# Patient Record
Sex: Female | Born: 1980 | Race: White | Hispanic: No | Marital: Single | Smoking: Current every day smoker
Health system: Southern US, Community
[De-identification: ages and names within clinical notes are randomized; demographics above are authoritative.]

## PROBLEM LIST (undated history)

## (undated) DIAGNOSIS — F1111 Opioid abuse, in remission: Secondary | ICD-10-CM

## (undated) DIAGNOSIS — G43909 Migraine, unspecified, not intractable, without status migrainosus: Secondary | ICD-10-CM

## (undated) DIAGNOSIS — I341 Nonrheumatic mitral (valve) prolapse: Secondary | ICD-10-CM

## (undated) DIAGNOSIS — F32A Depression, unspecified: Secondary | ICD-10-CM

## (undated) DIAGNOSIS — F419 Anxiety disorder, unspecified: Secondary | ICD-10-CM

## (undated) DIAGNOSIS — F1910 Other psychoactive substance abuse, uncomplicated: Secondary | ICD-10-CM

## (undated) DIAGNOSIS — E039 Hypothyroidism, unspecified: Secondary | ICD-10-CM

## (undated) DIAGNOSIS — F329 Major depressive disorder, single episode, unspecified: Secondary | ICD-10-CM

## (undated) HISTORY — PX: MOUTH SURGERY: SHX715

## (undated) HISTORY — PX: KNEE ARTHROPLASTY: SHX992

## (undated) HISTORY — PX: KNEE SURGERY: SHX244

---

## 1998-03-19 ENCOUNTER — Ambulatory Visit (HOSPITAL_COMMUNITY): Admission: RE | Admit: 1998-03-19 | Discharge: 1998-03-19 | Payer: Self-pay | Admitting: Family Medicine

## 2005-06-14 ENCOUNTER — Emergency Department (HOSPITAL_COMMUNITY): Admission: EM | Admit: 2005-06-14 | Discharge: 2005-06-14 | Payer: Self-pay | Admitting: Family Medicine

## 2005-07-06 ENCOUNTER — Emergency Department (HOSPITAL_COMMUNITY): Admission: EM | Admit: 2005-07-06 | Discharge: 2005-07-06 | Payer: Self-pay | Admitting: Family Medicine

## 2005-07-20 ENCOUNTER — Emergency Department (HOSPITAL_COMMUNITY): Admission: EM | Admit: 2005-07-20 | Discharge: 2005-07-20 | Payer: Self-pay | Admitting: Family Medicine

## 2005-08-26 ENCOUNTER — Emergency Department (HOSPITAL_COMMUNITY): Admission: EM | Admit: 2005-08-26 | Discharge: 2005-08-26 | Payer: Self-pay | Admitting: Family Medicine

## 2005-09-13 ENCOUNTER — Emergency Department (HOSPITAL_COMMUNITY): Admission: EM | Admit: 2005-09-13 | Discharge: 2005-09-13 | Payer: Self-pay | Admitting: Family Medicine

## 2005-10-04 ENCOUNTER — Emergency Department (HOSPITAL_COMMUNITY): Admission: EM | Admit: 2005-10-04 | Discharge: 2005-10-04 | Payer: Self-pay | Admitting: Family Medicine

## 2005-11-19 ENCOUNTER — Emergency Department (HOSPITAL_COMMUNITY): Admission: EM | Admit: 2005-11-19 | Discharge: 2005-11-19 | Payer: Self-pay | Admitting: Family Medicine

## 2005-11-27 ENCOUNTER — Emergency Department (HOSPITAL_COMMUNITY): Admission: EM | Admit: 2005-11-27 | Discharge: 2005-11-27 | Payer: Self-pay | Admitting: Family Medicine

## 2005-12-13 ENCOUNTER — Emergency Department (HOSPITAL_COMMUNITY): Admission: EM | Admit: 2005-12-13 | Discharge: 2005-12-13 | Payer: Self-pay | Admitting: Family Medicine

## 2005-12-17 ENCOUNTER — Emergency Department (HOSPITAL_COMMUNITY): Admission: EM | Admit: 2005-12-17 | Discharge: 2005-12-17 | Payer: Self-pay | Admitting: Family Medicine

## 2006-01-12 ENCOUNTER — Ambulatory Visit: Payer: Self-pay | Admitting: Family Medicine

## 2006-01-21 ENCOUNTER — Emergency Department (HOSPITAL_COMMUNITY): Admission: EM | Admit: 2006-01-21 | Discharge: 2006-01-21 | Payer: Self-pay | Admitting: Family Medicine

## 2006-02-07 ENCOUNTER — Ambulatory Visit: Payer: Self-pay | Admitting: Family Medicine

## 2006-02-20 ENCOUNTER — Ambulatory Visit: Payer: Self-pay | Admitting: Family Medicine

## 2006-02-26 ENCOUNTER — Emergency Department (HOSPITAL_COMMUNITY): Admission: EM | Admit: 2006-02-26 | Discharge: 2006-02-26 | Payer: Self-pay | Admitting: Family Medicine

## 2006-03-08 ENCOUNTER — Emergency Department (HOSPITAL_COMMUNITY): Admission: EM | Admit: 2006-03-08 | Discharge: 2006-03-08 | Payer: Self-pay | Admitting: Family Medicine

## 2006-03-21 ENCOUNTER — Ambulatory Visit: Payer: Self-pay | Admitting: Family Medicine

## 2006-03-25 ENCOUNTER — Emergency Department (HOSPITAL_COMMUNITY): Admission: AD | Admit: 2006-03-25 | Discharge: 2006-03-25 | Payer: Self-pay | Admitting: Family Medicine

## 2006-04-11 ENCOUNTER — Emergency Department (HOSPITAL_COMMUNITY): Admission: EM | Admit: 2006-04-11 | Discharge: 2006-04-11 | Payer: Self-pay | Admitting: Emergency Medicine

## 2006-04-12 ENCOUNTER — Ambulatory Visit: Payer: Self-pay | Admitting: Family Medicine

## 2006-04-24 ENCOUNTER — Emergency Department (HOSPITAL_COMMUNITY): Admission: EM | Admit: 2006-04-24 | Discharge: 2006-04-24 | Payer: Self-pay | Admitting: Family Medicine

## 2006-05-09 ENCOUNTER — Ambulatory Visit: Payer: Self-pay | Admitting: Family Medicine

## 2010-05-16 ENCOUNTER — Emergency Department (HOSPITAL_COMMUNITY): Admission: EM | Admit: 2010-05-16 | Discharge: 2010-05-16 | Payer: Self-pay | Admitting: Emergency Medicine

## 2010-10-06 LAB — URINE CULTURE: Colony Count: >=100000

## 2010-10-06 LAB — ETHANOL: Alcohol, Ethyl (B): <5 mg/dL (ref 0–10)

## 2010-10-06 LAB — RAPID URINE DRUG SCREEN, HOSP PERFORMED
Amphetamines: POSITIVE — AB
Cocaine: NOT DETECTED
Tetrahydrocannabinol: POSITIVE — AB

## 2010-10-06 LAB — COMPREHENSIVE METABOLIC PANEL
ALT: 9 U/L (ref 0–35)
AST: 16 U/L (ref 0–37)
Alkaline Phosphatase: 58 U/L (ref 39–117)
CO2: 24 mEq/L (ref 19–32)
Calcium: 9 mg/dL (ref 8.4–10.5)
Chloride: 110 mEq/L (ref 96–112)
GFR calc Af Amer: >60 mL/min (ref >60)
GFR calc non Af Amer: >60 mL/min (ref >60)
Glucose, Bld: 98 mg/dL (ref 70–99)
Potassium: 3.9 mEq/L (ref 3.5–5.1)
Sodium: 138 mEq/L (ref 135–145)
Total Bilirubin: 0.8 mg/dL (ref 0.3–1.2)

## 2010-10-06 LAB — CBC
HCT: 37.6 % (ref 36.0–46.0)
Hemoglobin: 12.7 g/dL (ref 12.0–15.0)
MCHC: 33.7 g/dL (ref 30.0–36.0)
RBC: 4.1 MIL/uL (ref 3.87–5.11)

## 2010-10-06 LAB — URINALYSIS, ROUTINE W REFLEX MICROSCOPIC
Glucose, UA: NEGATIVE mg/dL
Nitrite: POSITIVE — AB
Protein, ur: NEGATIVE mg/dL
pH: 7 (ref 5.0–8.0)

## 2010-10-06 LAB — URINE MICROSCOPIC-ADD ON

## 2010-10-06 LAB — DIFFERENTIAL
Basophils Absolute: 0 10*3/uL (ref 0.0–0.1)
Basophils Relative: 1 % (ref 0–1)
Eosinophils Absolute: 0.1 10*3/uL (ref 0.0–0.7)
Eosinophils Relative: 1 % (ref 0–5)
Neutrophils Relative %: 72 % (ref 43–77)

## 2010-10-06 LAB — POCT PREGNANCY, URINE: Preg Test, Ur: NEGATIVE

## 2011-06-21 ENCOUNTER — Emergency Department (HOSPITAL_COMMUNITY)
Admission: EM | Admit: 2011-06-21 | Discharge: 2011-06-22 | Disposition: A | Discharge status: Home / Self Care | Payer: Self-pay | Attending: Emergency Medicine | Admitting: Emergency Medicine

## 2011-06-21 DIAGNOSIS — F1123 Opioid dependence with withdrawal: Secondary | ICD-10-CM

## 2011-06-21 DIAGNOSIS — F1120 Opioid dependence, uncomplicated: Secondary | ICD-10-CM | POA: Insufficient documentation

## 2011-06-21 DIAGNOSIS — F1910 Other psychoactive substance abuse, uncomplicated: Secondary | ICD-10-CM

## 2011-06-21 DIAGNOSIS — F1110 Opioid abuse, uncomplicated: Secondary | ICD-10-CM | POA: Insufficient documentation

## 2011-06-21 DIAGNOSIS — F19939 Other psychoactive substance use, unspecified with withdrawal, unspecified: Secondary | ICD-10-CM | POA: Insufficient documentation

## 2011-06-21 DIAGNOSIS — R109 Unspecified abdominal pain: Secondary | ICD-10-CM | POA: Insufficient documentation

## 2011-06-21 DIAGNOSIS — Z79899 Other long term (current) drug therapy: Secondary | ICD-10-CM | POA: Insufficient documentation

## 2011-06-21 LAB — CBC
HCT: 34.7 % — ABNORMAL LOW (ref 36.0–46.0)
MCHC: 33.4 g/dL (ref 30.0–36.0)
Platelets: 255 10*3/uL (ref 150–400)
RDW: 14.1 % (ref 11.5–15.5)
WBC: 6.4 10*3/uL (ref 4.0–10.5)

## 2011-06-21 LAB — BASIC METABOLIC PANEL
BUN: 9 mg/dL (ref 6–23)
Chloride: 102 mEq/L (ref 96–112)
GFR calc Af Amer: >90 mL/min (ref >90)
GFR calc non Af Amer: >90 mL/min (ref >90)
Glucose, Bld: 87 mg/dL (ref 70–99)
Potassium: 3.9 mEq/L (ref 3.5–5.1)
Sodium: 138 mEq/L (ref 135–145)

## 2011-06-21 LAB — RAPID URINE DRUG SCREEN, HOSP PERFORMED
Amphetamines: NOT DETECTED
Opiates: POSITIVE — AB
Tetrahydrocannabinol: POSITIVE — AB

## 2011-06-21 LAB — ACETAMINOPHEN LEVEL: Acetaminophen (Tylenol), Serum: <15 ug/mL (ref 10–30)

## 2011-06-21 MED ORDER — ONDANSETRON HCL 8 MG PO TABS: 4.0000 mg | ORAL_TABLET | Freq: Three times a day (TID) | ORAL | Status: DC | PRN

## 2011-06-21 MED ORDER — CLONIDINE HCL 0.1 MG PO TABS: 0.2000 mg | ORAL_TABLET | Freq: Three times a day (TID) | ORAL | Status: DC | PRN

## 2011-06-21 MED ORDER — ZOLPIDEM TARTRATE 5 MG PO TABS: 5.0000 mg | ORAL_TABLET | Freq: Every evening | ORAL | Status: DC | PRN

## 2011-06-21 MED ORDER — LEVOTHYROXINE SODIUM 25 MCG PO TABS
25.0000 ug | ORAL_TABLET | ORAL | Status: DC
  Filled 2011-06-21: qty 1

## 2011-06-21 MED ORDER — LORAZEPAM 1 MG PO TABS
1.0000 mg | ORAL_TABLET | Freq: Three times a day (TID) | ORAL | Status: DC | PRN
  Administered 2011-06-21 – 2011-06-22 (×3): 1 mg via ORAL
  Filled 2011-06-21 (×3): qty 1

## 2011-06-21 MED ORDER — ACETAMINOPHEN 325 MG PO TABS: 650.0000 mg | ORAL_TABLET | ORAL | Status: DC | PRN

## 2011-06-21 MED ORDER — ALUM & MAG HYDROXIDE-SIMETH 200-200-20 MG/5ML PO SUSP: 30.0000 mL | ORAL | Status: DC | PRN

## 2011-06-21 MED ORDER — NICOTINE 21 MG/24HR TD PT24
21.0000 mg | MEDICATED_PATCH | Freq: Every day | TRANSDERMAL | Status: DC
  Administered 2011-06-21 – 2011-06-22 (×2): 21 mg via TRANSDERMAL
  Filled 2011-06-21 (×2): qty 1

## 2011-06-21 MED ORDER — IBUPROFEN 200 MG PO TABS
600.0000 mg | ORAL_TABLET | Freq: Three times a day (TID) | ORAL | Status: DC | PRN
  Administered 2011-06-22: 600 mg via ORAL
  Filled 2011-06-21: qty 3

## 2011-06-21 MED ORDER — CITALOPRAM HYDROBROMIDE 40 MG PO TABS
40.0000 mg | ORAL_TABLET | ORAL | Status: DC
  Filled 2011-06-21: qty 1

## 2011-06-21 NOTE — ED Notes (Signed)
Meal tray provided per pt request.  

## 2011-06-21 NOTE — ED Notes (Signed)
Resting quietly in bed. Pt remains calm/cooperative. Resp even and unlabored. No complaints at this time. Awaiting placement.

## 2011-06-21 NOTE — ED Notes (Signed)
Pt presents with request for detox from opiates.  Pt reports she went to [facility] and was told to come here for detox.  Pt reports last week, she attempted suicide by cutting her wrists, but when asked if she really wanted to die, pt states "no, I think it was a cry for help".  Pt reports she had pills hidden at home and took a vicodin 5mg  tablet this morning.  Pt reports previous detox with seizure activity.  Pt is alert, oriented and pleasant; mother with pt.
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
## 2011-06-21 NOTE — ED Notes (Signed)
Pt. Has requested help off of opiates .  Has been doing them for 10 years,.  Heroine

## 2011-06-21 NOTE — ED Notes (Signed)
ACT team at bedside. Report given to [facility]. Pt aware or move.

## 2011-06-21 NOTE — ED Provider Notes (Signed)
History     CSN: [id number] Arrival date & time: 06/21/2011  9:49 AM   First MD Initiated Contact with Patient 06/21/11 1207      Chief Complaint  Patient presents with  . Addiction Problem    opiates    (Consider location/radiation/quality/duration/timing/severity/associated sxs/prior treatment) HPI Patient is a 30 year old female with a history of opiate abuse for the past 10 years. She presents today with referral from [facility]. She's been told that she needs inpatient rehabilitation prior to being able to be expected to their rehabilitation program. The patient had been taking clonidine as an outpatient. She endorses heroin and opiate abuse. She denied other substances. Patient is not currently suicidal or homicidal. She has had hospitalization in the recent past for suicidal ideation. Patient does not drink any alcohol but does smoke tobacco. There are no other associated or modifying factors. She denies any pain and only mild abdominal discomfort at this time which she associates with not using substances. History reviewed. No pertinent past medical history.  Past Surgical History  Procedure Date  . Knee arthroplasty     History reviewed. No pertinent family history.  History  Substance Use Topics  . Smoking status: Current Everyday Smoker -- 1.0 packs/day  . Smokeless tobacco: Not on file  . Alcohol Use: No    OB History    Grav Para Term Preterm Abortions TAB SAB Ect Mult Living                  Review of Systems  Constitutional: Negative.   HENT: Negative.   Eyes: Negative.   Respiratory: Negative.   Cardiovascular: Negative.   Gastrointestinal: Negative.   Genitourinary: Negative.   Musculoskeletal: Negative.   Skin: Negative.   Neurological: Negative.   Hematological: Negative.   Psychiatric/Behavioral: Negative.   All other systems reviewed and are negative.    Allergies  Review of patient's allergies indicates no known allergies.  Home  Medications   Current Outpatient Rx  Name Route Sig Dispense Refill  . CITALOPRAM HYDROBROMIDE 40 MG PO TABS Oral Take 40 mg by mouth daily.      Marland Kitchen CLONIDINE HCL 0.2 MG PO TABS Oral Take 0.2 mg by mouth every 8 (eight) hours as needed. For withdrawal     . LEVOTHYROXINE SODIUM 25 MCG PO TABS Oral Take 25 mcg by mouth daily.        BP 96/72  Pulse 62  Temp(Src) 97.8 F (36.6 C) (Oral)  Resp 20  Ht 5\' 3"  (1.6 m)  Wt 100 lb (45.36 kg)  BMI 17.71 kg/m2  SpO2 98%  Physical Exam  Nursing note and vitals reviewed. Constitutional: She is oriented to person, place, and time. She appears well-developed and well-nourished. No distress.  HENT:  Head: Normocephalic and atraumatic.  Eyes: Conjunctivae and EOM are normal. Pupils are equal, round, and reactive to light.  Neck: Normal range of motion.  Cardiovascular: Normal rate, regular rhythm, normal heart sounds and intact distal pulses.  Exam reveals no gallop and no friction rub.   No murmur heard. Pulmonary/Chest: Effort normal and breath sounds normal. No respiratory distress. She has no wheezes. She has no rales.  Abdominal: Soft. Bowel sounds are normal. She exhibits no distension. There is no tenderness. There is no rebound and no guarding.  Musculoskeletal: Normal range of motion. She exhibits no edema and no tenderness.  Neurological: She is alert and oriented to person, place, and time. No cranial nerve deficit. She exhibits normal muscle  tone. Coordination normal.  Skin: Skin is warm and dry. No rash noted.  Psychiatric: She has a normal mood and affect.    ED Course  Procedures (including critical care time)  Labs Reviewed  CBC - Abnormal; Notable for the following:    RBC 3.76 (*)    Hemoglobin 11.6 (*)    HCT 34.7 (*)    All other components within normal limits  URINE RAPID DRUG SCREEN (HOSP PERFORMED) - Abnormal; Notable for the following:    Opiates POSITIVE (*)    Tetrahydrocannabinol POSITIVE (*)    Barbiturates  POSITIVE (*)    All other components within normal limits  ETHANOL  ACETAMINOPHEN LEVEL  BASIC METABOLIC PANEL  POCT PREGNANCY, URINE  POCT PREGNANCY, URINE   No results found.   1. Polysubstance abuse       MDM  Patient was admitted and interviewed by myself. Patient did not have any suicidal or homicidal ideations. She is here purely because she wants detox. Medical clearance labs were ordered. Patient was seen by the act team. Holding orders were placed including orders for clonidine as well as the patient's home medications of Celexa and Synthroid. [facility] is being contacted per the act team regarding placement.        Cyndra Numbers, MD 06/21/11 [phone]

## 2011-06-21 NOTE — ED Notes (Signed)
ACT team at bedside to see pt. Moving to room 31 after consult.

## 2011-06-22 MED ORDER — LORAZEPAM 1 MG PO TABS: 1.0000 mg | ORAL_TABLET | Freq: Three times a day (TID) | ORAL | Status: AC | PRN

## 2011-06-22 MED ORDER — ONDANSETRON 4 MG PO TBDP
ORAL_TABLET | ORAL | Status: AC
  Administered 2011-06-22: 4 mg via ORAL
  Filled 2011-06-22: qty 1

## 2011-06-22 MED ORDER — ONDANSETRON HCL 4 MG PO TABS: 4.0000 mg | ORAL_TABLET | Freq: Four times a day (QID) | ORAL | Status: AC

## 2011-06-22 MED ORDER — PROMETHAZINE HCL 25 MG PO TABS: 25.0000 mg | ORAL_TABLET | Freq: Three times a day (TID) | ORAL | Status: DC | PRN

## 2011-06-22 NOTE — ED Notes (Signed)
Pt awnakened for exam by mental health. No s/o distress.

## 2011-06-22 NOTE — ED Notes (Signed)
Mother of Terri Oneal, asking to speak with [name] from [facility]. Called [name] to request that she come and talk with her.
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
## 2011-06-22 NOTE — ED Provider Notes (Signed)
Pt/family agreeable for d/c and f/u with [facility] Ativan/phenergen/zofran ordered Already has clonidine No SI Well appearing, stable for d/c  Joya Gaskins, MD 06/22/11 1652

## 2011-06-22 NOTE — ED Notes (Signed)
Terri Oneal here to talk with the mother of Terri Oneal.

## 2011-06-22 NOTE — ED Notes (Signed)
Medications from home counted and given to pharm tech to take downstairs to pharmacy. Family member at bedside upset that pt not admitted yet. Explained the process to him and the pt.

## 2012-03-05 ENCOUNTER — Emergency Department (HOSPITAL_COMMUNITY)
Admission: EM | Admit: 2012-03-05 | Discharge: 2012-03-05 | Disposition: A | Discharge status: Home / Self Care | Payer: Self-pay | Attending: Emergency Medicine | Admitting: Emergency Medicine

## 2012-03-05 ENCOUNTER — Encounter (HOSPITAL_COMMUNITY): Payer: Self-pay | Admitting: Emergency Medicine

## 2012-03-05 DIAGNOSIS — O2390 Unspecified genitourinary tract infection in pregnancy, unspecified trimester: Secondary | ICD-10-CM | POA: Insufficient documentation

## 2012-03-05 DIAGNOSIS — N390 Urinary tract infection, site not specified: Secondary | ICD-10-CM

## 2012-03-05 DIAGNOSIS — M549 Dorsalgia, unspecified: Secondary | ICD-10-CM | POA: Insufficient documentation

## 2012-03-05 DIAGNOSIS — Z331 Pregnant state, incidental: Secondary | ICD-10-CM

## 2012-03-05 DIAGNOSIS — E039 Hypothyroidism, unspecified: Secondary | ICD-10-CM | POA: Insufficient documentation

## 2012-03-05 DIAGNOSIS — R0602 Shortness of breath: Secondary | ICD-10-CM | POA: Insufficient documentation

## 2012-03-05 DIAGNOSIS — F341 Dysthymic disorder: Secondary | ICD-10-CM | POA: Insufficient documentation

## 2012-03-05 HISTORY — DX: Anxiety disorder, unspecified: F41.9

## 2012-03-05 HISTORY — DX: Hypothyroidism, unspecified: E03.9

## 2012-03-05 HISTORY — DX: Migraine, unspecified, not intractable, without status migrainosus: G43.909

## 2012-03-05 HISTORY — DX: Major depressive disorder, single episode, unspecified: F32.9

## 2012-03-05 HISTORY — DX: Opioid abuse, in remission: F11.11

## 2012-03-05 HISTORY — DX: Nonrheumatic mitral (valve) prolapse: I34.1

## 2012-03-05 HISTORY — DX: Depression, unspecified: F32.A

## 2012-03-05 LAB — URINALYSIS, ROUTINE W REFLEX MICROSCOPIC
Bilirubin Urine: NEGATIVE
Glucose, UA: NEGATIVE mg/dL
Ketones, ur: NEGATIVE mg/dL
Specific Gravity, Urine: 1.003 — ABNORMAL LOW (ref 1.005–1.030)
pH: 7 (ref 5.0–8.0)

## 2012-03-05 LAB — URINE MICROSCOPIC-ADD ON

## 2012-03-05 MED ORDER — ONDANSETRON HCL 4 MG PO TABS: 4.0000 mg | ORAL_TABLET | Freq: Three times a day (TID) | ORAL | Status: AC | PRN

## 2012-03-05 MED ORDER — PRENATAL COMPLETE 14-0.4 MG PO TABS: 1.0000 | ORAL_TABLET | Freq: Every day | ORAL | Status: DC

## 2012-03-05 MED ORDER — CEPHALEXIN 500 MG PO CAPS: 500.0000 mg | ORAL_CAPSULE | Freq: Four times a day (QID) | ORAL | Status: AC

## 2012-03-05 MED ORDER — ACETAMINOPHEN 500 MG PO TABS
1000.0000 mg | ORAL_TABLET | Freq: Once | ORAL | Status: AC
  Administered 2012-03-05: 1000 mg via ORAL
  Filled 2012-03-05: qty 2

## 2012-03-05 NOTE — ED Notes (Signed)
Pt states for the past 4 to 5 days she has been feeling sick  Pt states for the past 3 days she has had a fever  4 days ago she started having pain in the lower part of her back on the right side  Pt states she is scared to take a deep breath because it causes her to have pain in her lower back on the right   Pt states she feels extremely tired and just today has started to have nausea

## 2012-03-05 NOTE — ED Provider Notes (Signed)
History     CSN: [id number]  Arrival date & time 03/05/12  2037   First MD Initiated Contact with Patient 03/05/12 2113      Chief Complaint  Patient presents with  . Fever  . Shortness of Breath  . Back Pain     HPI Pt was seen at 2130.  Per pt, c/o gradual onset and persistence of constant dysuria for the past 4-5 days.  Has been associated with right flank pain, nausea, and home fever to "102" for past 1-2 days.  Denies abd pain, no rash, no CP/SOB, no vaginal bleeding/discharge, no hematuria.    Past Medical History  Diagnosis Date  . Hypothyroid   . Migraine   . MVP (mitral valve prolapse)   . Opioid abuse, in remission   . Anxiety and depression     Past Surgical History  Procedure Date  . Knee arthroplasty   . Knee surgery   . Mouth surgery     Family History  Problem Relation Age of Onset  . Diabetes Other   . Hypertension Other   . Thyroid disease Other     History  Substance Use Topics  . Smoking status: Current Everyday Smoker -- 1.0 packs/day    Types: Cigarettes  . Smokeless tobacco: Not on file  . Alcohol Use: No    Review of Systems ROS: Statement: All systems negative except as marked or noted in the HPI; Constitutional: +fever and chills. ; ; Eyes: Negative for eye pain, redness and discharge. ; ; ENMT: Negative for ear pain, hoarseness, nasal congestion, sinus pressure and sore throat. ; ; Cardiovascular: Negative for chest pain, palpitations, diaphoresis, dyspnea and peripheral edema. ; ; Respiratory: Negative for cough, wheezing and stridor. ; ; Gastrointestinal: +nausea. Negative for vomiting, diarrhea, abdominal pain, blood in stool, hematemesis, jaundice and rectal bleeding. . ; ; Genitourinary: +dysuria, flank pain. Negative for hematuria. ; ; GYN:  No vaginal bleeding, no vaginal discharge, no vulvar pain. ;; Musculoskeletal: Negative for neck pain. Negative for swelling and trauma.; ; Skin: Negative for pruritus, rash, abrasions, blisters,  bruising and skin lesion.; ; Neuro: Negative for headache, lightheadedness and neck stiffness. Negative for weakness, altered level of consciousness , altered mental status, extremity weakness, paresthesias, involuntary movement, seizure and syncope.      Allergies  Review of patient's allergies indicates no known allergies.  Home Medications   Current Outpatient Rx  Name Route Sig Dispense Refill  . BUPROPION HCL ER (XL) 300 MG PO TB24 Oral Take 300 mg by mouth daily.    Marland Kitchen CITALOPRAM HYDROBROMIDE 20 MG PO TABS Oral Take 20 mg by mouth daily.    . IBUPROFEN 200 MG PO TABS Oral Take 800 mg by mouth every 6 (six) hours as needed.      BP 110/77  Pulse 89  Temp 99.9 F (37.7 C) (Oral)  Resp 20  SpO2 100%  LMP 02/12/2012  Physical Exam 2135: Physical examination:  Nursing notes reviewed; Vital signs and O2 SAT reviewed;  Constitutional: Well developed, Well nourished, Well hydrated, In no acute distress; Head:  Normocephalic, atraumatic; Eyes: EOMI, PERRL, No scleral icterus; ENMT: Mouth and pharynx normal, Mucous membranes moist; Neck: Supple, Full range of motion, No lymphadenopathy; Cardiovascular: Regular rate and rhythm, No murmur, rub, or gallop; Respiratory: Breath sounds clear & equal bilaterally, No rales, rhonchi, wheezes.  Speaking full sentences with ease, Normal respiratory effort/excursion; Chest: Nontender, Movement normal; Abdomen: Soft, Nontender, Nondistended, Normal bowel sounds; Genitourinary: No CVA tenderness;  Spine:  No midline CS, TS, LS tenderness.  +TTP right lumbar paraspinal muscles.; Extremities: Pulses normal, No tenderness, No edema, No calf edema or asymmetry.; Neuro: AA&Ox3, Major CN grossly intact.  Speech clear. No gross focal motor or sensory deficits in extremities.; Skin: Color normal, Warm, Dry.   ED Course  Procedures    MDM  MDM Reviewed: nursing note and vitals Interpretation: labs     Results for orders placed during the hospital  encounter of 03/05/12  URINALYSIS, ROUTINE W REFLEX MICROSCOPIC      Component Value Range   Color, Urine YELLOW  YELLOW   APPearance CLOUDY (*) CLEAR   Specific Gravity, Urine 1.003 (*) 1.005 - 1.030   pH 7.0  5.0 - 8.0   Glucose, UA NEGATIVE  NEGATIVE mg/dL   Hgb urine dipstick SMALL (*) NEGATIVE   Bilirubin Urine NEGATIVE  NEGATIVE   Ketones, ur NEGATIVE  NEGATIVE mg/dL   Protein, ur NEGATIVE  NEGATIVE mg/dL   Urobilinogen, UA 1.0  0.0 - 1.0 mg/dL   Nitrite NEGATIVE  NEGATIVE   Leukocytes, UA LARGE (*) NEGATIVE  PREGNANCY, URINE      Component Value Range   Preg Test, Ur POSITIVE (*) NEGATIVE  URINE MICROSCOPIC-ADD ON      Component Value Range   Squamous Epithelial / LPF FEW (*) RARE   WBC, UA 21-50  <3 WBC/hpf   RBC / HPF 3-6  <3 RBC/hpf   Bacteria, UA FEW (*) RARE      2230:  Pt states she is ready to go home now.  Has tol PO well without N/V.  Endorses she has been having regular menses and "didn't know I was pregnant."  States she does not have any insurance and does not want any further testing.  Abd is benign on exam.  Pt encouraged to f/u with OB/GYN.  Dx testing d/w pt.  Questions answered.  Verb understanding, agreeable to d/c home with outpt f/u.          Laray Anger, DO 03/07/12 1621

## 2013-11-26 ENCOUNTER — Emergency Department (HOSPITAL_COMMUNITY)
Admission: EM | Admit: 2013-11-26 | Discharge: 2013-11-26 | Disposition: A | Discharge status: Home / Self Care | Payer: Self-pay | Attending: Emergency Medicine | Admitting: Emergency Medicine

## 2013-11-26 ENCOUNTER — Encounter (HOSPITAL_COMMUNITY): Payer: Self-pay | Admitting: Emergency Medicine

## 2013-11-26 DIAGNOSIS — Z8639 Personal history of other endocrine, nutritional and metabolic disease: Secondary | ICD-10-CM | POA: Insufficient documentation

## 2013-11-26 DIAGNOSIS — Z862 Personal history of diseases of the blood and blood-forming organs and certain disorders involving the immune mechanism: Secondary | ICD-10-CM | POA: Insufficient documentation

## 2013-11-26 DIAGNOSIS — G43909 Migraine, unspecified, not intractable, without status migrainosus: Secondary | ICD-10-CM | POA: Insufficient documentation

## 2013-11-26 DIAGNOSIS — F17200 Nicotine dependence, unspecified, uncomplicated: Secondary | ICD-10-CM | POA: Insufficient documentation

## 2013-11-26 DIAGNOSIS — Z8659 Personal history of other mental and behavioral disorders: Secondary | ICD-10-CM | POA: Insufficient documentation

## 2013-11-26 MED ORDER — SODIUM CHLORIDE 0.9 % IV BOLUS (SEPSIS)
1000.0000 mL | Freq: Once | INTRAVENOUS | Status: AC
  Administered 2013-11-26: 1000 mL via INTRAVENOUS

## 2013-11-26 MED ORDER — PROCHLORPERAZINE EDISYLATE 5 MG/ML IJ SOLN
10.0000 mg | Freq: Once | INTRAMUSCULAR | Status: AC
  Administered 2013-11-26: 10 mg via INTRAVENOUS
  Filled 2013-11-26: qty 2

## 2013-11-26 NOTE — ED Notes (Addendum)
Pt reports hx of migraines. Pt unable to take narcotics due to past abuse. Pt reports her migraines normally do not last this long, normally only 4-6 hours. Reports she normally sees spots in one eye and has pain on the other side of head. Now has seen spots of both sides, so pain across entire head. Pain at present 5/10, has been taking advil. Vomited yesterday. sensitivity to light and sound. Needs doctors note.

## 2013-11-26 NOTE — ED Provider Notes (Signed)
TIME SEEN: 1:00 PM  CHIEF COMPLAINT: Migraine headache  HPI: Patient is a 33 y.o. F with history of migraine headaches, hypothyroidism, opioid abuse who presents emergency room and with migraine headache has lasted for several days. She states the pain is diffuse across her head, throbbing associated with photophobia and nausea. She states this feels like her prior migraines but normally they resolve with ibuprofen at home. She denies any fevers. No neck pain or neck stiffness. No head injury. She is not on anticoagulation. No numbness or focal weakness. No thunderclap headache. No sudden onset headache. No worst headache of her life.  ROS: See HPI Constitutional: no fever  Eyes: no drainage  ENT: no runny nose   Cardiovascular:  no chest pain  Resp: no SOB  GI: no vomiting GU: no dysuria Integumentary: no rash  Allergy: no hives  Musculoskeletal: no leg swelling  Neurological: no slurred speech ROS otherwise negative  PAST MEDICAL HISTORY/PAST SURGICAL HISTORY:  Past Medical History  Diagnosis Date  . Hypothyroid   . Migraine   . MVP (mitral valve prolapse)   . Opioid abuse, in remission   . Anxiety and depression     MEDICATIONS:  Prior to Admission medications   Medication Sig Start Date End Date Taking? Authorizing Provider  ibuprofen (ADVIL,MOTRIN) 200 MG tablet Take 800 mg by mouth every 6 (six) hours as needed.   Yes Historical Provider, MD    ALLERGIES:  No Known Allergies  SOCIAL HISTORY:  History  Substance Use Topics  . Smoking status: Current Every Day Smoker -- 1.00 packs/day    Types: Cigarettes  . Smokeless tobacco: Not on file  . Alcohol Use: No    FAMILY HISTORY: Family History  Problem Relation Age of Onset  . Diabetes Other   . Hypertension Other   . Thyroid disease Other     EXAM: BP 128/85  Pulse 75  Temp(Src) 98.6 F (37 C) (Oral)  Resp 16  SpO2 99% CONSTITUTIONAL: Alert and oriented and responds appropriately to questions.  Well-appearing; well-nourished HEAD: Normocephalic EYES: Conjunctivae clear, PERRL ENT: normal nose; no rhinorrhea; moist mucous membranes; pharynx without lesions noted NECK: Supple, no meningismus, no LAD  CARD: RRR; S1 and S2 appreciated; no murmurs, no clicks, no rubs, no gallops RESP: Normal chest excursion without splinting or tachypnea; breath sounds clear and equal bilaterally; no wheezes, no rhonchi, no rales,  ABD/GI: Normal bowel sounds; non-distended; soft, non-tender, no rebound, no guarding BACK:  The back appears normal and is non-tender to palpation, there is no CVA tenderness EXT: Normal ROM in all joints; non-tender to palpation; no edema; normal capillary refill; no cyanosis    SKIN: Normal color for age and race; warm NEURO: Moves all extremities equally, sensation to light touch intact diffusely, cranial nerves II through XII intact, normal gait PSYCH: The patient's mood and manner are appropriate. Grooming and personal hygiene are appropriate.  MEDICAL DECISION MAKING: Patient here with migraine headache. She is otherwise hemodynamically stable, well-appearing, nontoxic and neurologically intact. We'll give IV fluids and Compazine and reassess.  ED PROGRESS: Patient's headache is almost completely resolved with IV Compazine. She states she's feeling much better ready for discharge home. We'll discharge with work note for today and yesterday which seems to be her primary concern. Have discussed strict return precautions. Have discussed supportive care instructions. Patient verbalizes understanding and is comfortable with plan. She is pleased with care.     Layla MawKristen N Nijah Orlich, DO 11/26/13 1540

## 2013-11-26 NOTE — Discharge Instructions (Signed)
Migraine Headache A migraine headache is an intense, throbbing pain on one or both sides of your head. A migraine can last for 30 minutes to several hours. CAUSES  The exact cause of a migraine headache is not always known. However, a migraine may be caused when nerves in the brain become irritated and release chemicals that cause inflammation. This causes pain. Certain things may also trigger migraines, such as:  Alcohol.  Smoking.  Stress.  Menstruation.  Aged cheeses.  Foods or drinks that contain nitrates, glutamate, aspartame, or tyramine.  Lack of sleep.  Chocolate.  Caffeine.  Hunger.  Physical exertion.  Fatigue.  Medicines used to treat chest pain (nitroglycerine), birth control pills, estrogen, and some blood pressure medicines. SIGNS AND SYMPTOMS  Pain on one or both sides of your head.  Pulsating or throbbing pain.  Severe pain that prevents daily activities.  Pain that is aggravated by any physical activity.  Nausea, vomiting, or both.  Dizziness.  Pain with exposure to bright lights, loud noises, or activity.  General sensitivity to bright lights, loud noises, or smells. Before you get a migraine, you may get warning signs that a migraine is coming (aura). An aura may include:  Seeing flashing lights.  Seeing bright spots, halos, or zig-zag lines.  Having tunnel vision or blurred vision.  Having feelings of numbness or tingling.  Having trouble talking.  Having muscle weakness. DIAGNOSIS  A migraine headache is often diagnosed based on:  Symptoms.  Physical exam.  A CT scan or MRI of your head. These imaging tests cannot diagnose migraines, but they can help rule out other causes of headaches. TREATMENT Medicines may be given for pain and nausea. Medicines can also be given to help prevent recurrent migraines.  HOME CARE INSTRUCTIONS  Only take over-the-counter or prescription medicines for pain or discomfort as directed by your  health care provider. The use of long-term narcotics is not recommended.  Lie down in a dark, quiet room when you have a migraine.  Keep a journal to find out what may trigger your migraine headaches. For example, write down:  What you eat and drink.  How much sleep you get.  Any change to your diet or medicines.  Limit alcohol consumption.  Quit smoking if you smoke.  Get 7 9 hours of sleep, or as recommended by your health care provider.  Limit stress.  Keep lights dim if bright lights bother you and make your migraines worse. SEEK IMMEDIATE MEDICAL CARE IF:   Your migraine becomes severe.  You have a fever.  You have a stiff neck.  You have vision loss.  You have muscular weakness or loss of muscle control.  You start losing your balance or have trouble walking.  You feel faint or pass out.  You have severe symptoms that are different from your first symptoms. MAKE SURE YOU:   Understand these instructions.  Will watch your condition.  Will get help right away if you are not doing well or get worse. Document Released: 07/11/2005 Document Revised: 05/01/2013 Document Reviewed: 03/18/2013 [employer] Patient Information 2014 [employer].  

## 2013-12-02 ENCOUNTER — Encounter (HOSPITAL_COMMUNITY): Payer: Self-pay | Admitting: Emergency Medicine

## 2013-12-02 ENCOUNTER — Emergency Department (HOSPITAL_COMMUNITY)
Admission: EM | Admit: 2013-12-02 | Discharge: 2013-12-02 | Disposition: A | Discharge status: Home / Self Care | Payer: Self-pay | Attending: Emergency Medicine | Admitting: Emergency Medicine

## 2013-12-02 DIAGNOSIS — Z8679 Personal history of other diseases of the circulatory system: Secondary | ICD-10-CM | POA: Insufficient documentation

## 2013-12-02 DIAGNOSIS — Z862 Personal history of diseases of the blood and blood-forming organs and certain disorders involving the immune mechanism: Secondary | ICD-10-CM | POA: Insufficient documentation

## 2013-12-02 DIAGNOSIS — Z8639 Personal history of other endocrine, nutritional and metabolic disease: Secondary | ICD-10-CM | POA: Insufficient documentation

## 2013-12-02 DIAGNOSIS — Z87440 Personal history of urinary (tract) infections: Secondary | ICD-10-CM | POA: Insufficient documentation

## 2013-12-02 DIAGNOSIS — Z8659 Personal history of other mental and behavioral disorders: Secondary | ICD-10-CM | POA: Insufficient documentation

## 2013-12-02 DIAGNOSIS — N12 Tubulo-interstitial nephritis, not specified as acute or chronic: Secondary | ICD-10-CM | POA: Insufficient documentation

## 2013-12-02 DIAGNOSIS — F17200 Nicotine dependence, unspecified, uncomplicated: Secondary | ICD-10-CM | POA: Insufficient documentation

## 2013-12-02 LAB — URINE MICROSCOPIC-ADD ON

## 2013-12-02 LAB — URINALYSIS, ROUTINE W REFLEX MICROSCOPIC
Bilirubin Urine: NEGATIVE
Glucose, UA: NEGATIVE mg/dL
Ketones, ur: NEGATIVE mg/dL
Nitrite: POSITIVE — AB
Protein, ur: 100 mg/dL — AB
Specific Gravity, Urine: 1.013 (ref 1.005–1.030)
Urobilinogen, UA: 1 mg/dL (ref 0.0–1.0)
pH: 6 (ref 5.0–8.0)

## 2013-12-02 MED ORDER — CEFTRIAXONE SODIUM 1 G IJ SOLR
1.0000 g | Freq: Once | INTRAMUSCULAR | Status: AC
  Administered 2013-12-02: 1 g via INTRAMUSCULAR
  Filled 2013-12-02: qty 10

## 2013-12-02 MED ORDER — CEPHALEXIN 500 MG PO CAPS: 500.0000 mg | ORAL_CAPSULE | Freq: Four times a day (QID) | ORAL | Status: DC

## 2013-12-02 MED ORDER — PROMETHAZINE HCL 25 MG PO TABS: 25.0000 mg | ORAL_TABLET | Freq: Four times a day (QID) | ORAL | Status: DC | PRN

## 2013-12-02 MED ORDER — LIDOCAINE HCL 1 % IJ SOLN
INTRAMUSCULAR | Status: AC
  Filled 2013-12-02: qty 20

## 2013-12-02 NOTE — ED Provider Notes (Signed)
CSN: [id number]     Arrival date & time 12/02/13  0759 History   First MD Initiated Contact with Patient 12/02/13 0827     No chief complaint on file.    (Consider location/radiation/quality/duration/timing/severity/associated sxs/prior Treatment) HPI Pt is a 33yo female presenting with concerns of kidney infection. Pt reports having urinary symptoms including frequency and urgency x1 month, gradually worsened into right lower back pain, hot and cold chills with dysuria and foul smelling urine x4-5 days.  Reports drinking cranberry juice w/o relief.  Denies taking tylenol or motrin for pain. Nausea but no vomiting or diarrhea. No vaginal symptoms. Denies fever.    Past Medical History  Diagnosis Date  . Hypothyroid   . Migraine   . MVP (mitral valve prolapse)   . Opioid abuse, in remission   . Anxiety and depression    Past Surgical History  Procedure Laterality Date  . Knee arthroplasty    . Knee surgery    . Mouth surgery     Family History  Problem Relation Age of Onset  . Diabetes Other   . Hypertension Other   . Thyroid disease Other    History  Substance Use Topics  . Smoking status: Current Every Day Smoker -- 1.00 packs/day    Types: Cigarettes  . Smokeless tobacco: Not on file  . Alcohol Use: No   OB History   Grav Para Term Preterm Abortions TAB SAB Ect Mult Living                 Review of Systems  Constitutional: Positive for fever ( subjective) and chills. Negative for appetite change and fatigue.  Respiratory: Negative for shortness of breath.   Cardiovascular: Negative for chest pain.  Gastrointestinal: Positive for nausea. Negative for vomiting, abdominal pain, diarrhea and constipation.  Genitourinary: Positive for dysuria, urgency, frequency, flank pain ( right) and difficulty urinating. Negative for hematuria, vaginal bleeding, vaginal discharge, vaginal pain, menstrual problem and pelvic pain.  Musculoskeletal: Positive for back pain ( right lower)  and myalgias.  All other systems reviewed and are negative.     Allergies  Review of patient's allergies indicates no known allergies.  Home Medications   Prior to Admission medications   Medication Sig Start Date End Date Taking? Authorizing Provider  ibuprofen (ADVIL,MOTRIN) 200 MG tablet Take 800 mg by mouth every 6 (six) hours as needed.    Historical Provider, MD   Pulse 98  Temp(Src) 99.3 F (37.4 C) (Oral)  Resp 20  Wt 115 lb (52.164 kg)  SpO2 97%  LMP 11/06/2013 Physical Exam  Nursing note and vitals reviewed. Constitutional: She appears well-developed and well-nourished.  Pt sitting in exam bed, appears uncomfortable but non-toxic.  HENT:  Head: Normocephalic and atraumatic.  Eyes: Conjunctivae are normal. No scleral icterus.  Neck: Normal range of motion.  Cardiovascular: Normal rate, regular rhythm and normal heart sounds.   Pulmonary/Chest: Effort normal and breath sounds normal. No respiratory distress. She has no wheezes. She has no rales. She exhibits no tenderness.  Abdominal: Soft. Bowel sounds are normal. She exhibits no distension and no mass. There is no tenderness. There is CVA tenderness ( right side). There is no rebound and no guarding.  Soft, non-distended, non-tender. Right side CVA tenderness.  Musculoskeletal: Normal range of motion.  Neurological: She is alert.  Skin: Skin is warm and dry.     ED Course  Procedures (including critical care time) Labs Review Labs Reviewed  URINALYSIS, ROUTINE W REFLEX MICROSCOPIC -  Abnormal; Notable for the following:    Color, Urine AMBER (*)    APPearance TURBID (*)    Hgb urine dipstick MODERATE (*)    Protein, ur 100 (*)    Nitrite POSITIVE (*)    Leukocytes, UA LARGE (*)    All other components within normal limits  URINE MICROSCOPIC-ADD ON - Abnormal; Notable for the following:    Bacteria, UA MANY (*)    All other components within normal limits    Imaging Review No results found.   EKG  Interpretation None      MDM   Final diagnoses:  Pyelonephritis    Pt with hx of UTI symptoms x1 month, worsened over last week into right lower back. Denies vaginal symptoms. Abd-soft, non-distended, non-tender. Low grade temp-99.8, non-toxic appearing. UA-significant for UTI, will tx as pyelonephritis given duration of symptoms and positive right CVAT.  IM rocephin given in ED. Rx: cipro. Discussed pt with Dr. Clarene DukeMcManus who agrees with tx plan. Return precautions provided. Pt verbalized understanding and agreement with tx plan.     Junius Finnerrin O'Malley, PA-C 12/02/13 1540

## 2013-12-02 NOTE — Progress Notes (Signed)
[facility] did not get to see patient but will be sending information about [facility] program to help patient establish primary care, using the address provided.  °

## 2013-12-02 NOTE — Discharge Instructions (Signed)
You may take tylenol and ibuprofen as needed for pain and fever.  Be sure to drink plenty of water and complete all of your antibiotics even if you start to feel better.  Follow up with primary care as needed. Return to ER for NEW or worsening symptoms.  Pyelonephritis, Adult Pyelonephritis is a kidney infection. In general, there are 2 main types of pyelonephritis:  Infections that come on quickly without any warning (acute pyelonephritis).  Infections that persist for a long period of time (chronic pyelonephritis). CAUSES  Two main causes of pyelonephritis are:  Bacteria traveling from the bladder to the kidney. This is a problem especially in pregnant women. The urine in the bladder can become filled with bacteria from multiple causes, including:  Inflammation of the prostate gland (prostatitis).  Sexual intercourse in females.  Bladder infection (cystitis).  Bacteria traveling from the bloodstream to the tissue part of the kidney. Problems that may increase your risk of getting a kidney infection include:  Diabetes.  Kidney stones or bladder stones.  Cancer.  Catheters placed in the bladder.  Other abnormalities of the kidney or ureter. SYMPTOMS   Abdominal pain.  Pain in the side or flank area.  Fever.  Chills.  Upset stomach.  Blood in the urine (dark urine).  Frequent urination.  Strong or persistent urge to urinate.  Burning or stinging when urinating. DIAGNOSIS  Your caregiver may diagnose your kidney infection based on your symptoms. A urine sample may also be taken. TREATMENT  In general, treatment depends on how severe the infection is.   If the infection is mild and caught early, your caregiver may treat you with oral antibiotics and send you home.  If the infection is more severe, the bacteria may have gotten into the bloodstream. This will require intravenous (IV) antibiotics and a hospital stay. Symptoms may include:  High fever.  Severe  flank pain.  Shaking chills.  Even after a hospital stay, your caregiver may require you to be on oral antibiotics for a period of time.  Other treatments may be required depending upon the cause of the infection. HOME CARE INSTRUCTIONS   Take your antibiotics as directed. Finish them even if you start to feel better.  Make an appointment to have your urine checked to make sure the infection is gone.  Drink enough fluids to keep your urine clear or pale yellow.  Take medicines for the bladder if you have urgency and frequency of urination as directed by your caregiver. SEEK IMMEDIATE MEDICAL CARE IF:   You have a fever or persistent symptoms for more than 2-3 days.  You have a fever and your symptoms suddenly get worse.  You are unable to take your antibiotics or fluids.  You develop shaking chills.  You experience extreme weakness or fainting.  There is no improvement after 2 days of treatment. MAKE SURE YOU:  Understand these instructions.  Will watch your condition.  Will get help right away if you are not doing well or get worse. Document Released: 07/11/2005 Document Revised: 01/10/2012 Document Reviewed: 12/15/2010 [facility] Patient Information 2014 [place], [place].

## 2013-12-02 NOTE — ED Notes (Signed)
Patient with right sided back pain, chills, body aches, and difficulty urinating with foul smell.  Patient reports she thinks she has had a bladder infection for some time and drinking cranberry juice thinking it would go away.

## 2013-12-05 NOTE — ED Provider Notes (Signed)
Medical screening examination/treatment/procedure(s) were performed by non-physician practitioner and as supervising physician I was immediately available for consultation/collaboration.   EKG Interpretation None        Citlalic Norlander M Lanique Gonzalo, DO 12/05/13 1023 

## 2014-02-27 ENCOUNTER — Encounter (HOSPITAL_COMMUNITY): Payer: Self-pay | Admitting: Emergency Medicine

## 2014-02-27 ENCOUNTER — Emergency Department (HOSPITAL_COMMUNITY)
Admission: EM | Admit: 2014-02-27 | Discharge: 2014-02-27 | Disposition: A | Discharge status: Home / Self Care | Payer: Self-pay | Attending: Emergency Medicine | Admitting: Emergency Medicine

## 2014-02-27 DIAGNOSIS — Y9389 Activity, other specified: Secondary | ICD-10-CM | POA: Insufficient documentation

## 2014-02-27 DIAGNOSIS — Z8679 Personal history of other diseases of the circulatory system: Secondary | ICD-10-CM | POA: Insufficient documentation

## 2014-02-27 DIAGNOSIS — Y9289 Other specified places as the place of occurrence of the external cause: Secondary | ICD-10-CM | POA: Insufficient documentation

## 2014-02-27 DIAGNOSIS — S01501A Unspecified open wound of lip, initial encounter: Secondary | ICD-10-CM | POA: Insufficient documentation

## 2014-02-27 DIAGNOSIS — Z79899 Other long term (current) drug therapy: Secondary | ICD-10-CM | POA: Insufficient documentation

## 2014-02-27 DIAGNOSIS — Z792 Long term (current) use of antibiotics: Secondary | ICD-10-CM | POA: Insufficient documentation

## 2014-02-27 DIAGNOSIS — Z862 Personal history of diseases of the blood and blood-forming organs and certain disorders involving the immune mechanism: Secondary | ICD-10-CM | POA: Insufficient documentation

## 2014-02-27 DIAGNOSIS — K089 Disorder of teeth and supporting structures, unspecified: Secondary | ICD-10-CM | POA: Insufficient documentation

## 2014-02-27 DIAGNOSIS — S01511A Laceration without foreign body of lip, initial encounter: Secondary | ICD-10-CM

## 2014-02-27 DIAGNOSIS — Z8639 Personal history of other endocrine, nutritional and metabolic disease: Secondary | ICD-10-CM | POA: Insufficient documentation

## 2014-02-27 DIAGNOSIS — W010XXA Fall on same level from slipping, tripping and stumbling without subsequent striking against object, initial encounter: Secondary | ICD-10-CM | POA: Insufficient documentation

## 2014-02-27 DIAGNOSIS — F419 Anxiety disorder, unspecified: Secondary | ICD-10-CM

## 2014-02-27 DIAGNOSIS — F411 Generalized anxiety disorder: Secondary | ICD-10-CM | POA: Insufficient documentation

## 2014-02-27 DIAGNOSIS — F17200 Nicotine dependence, unspecified, uncomplicated: Secondary | ICD-10-CM | POA: Insufficient documentation

## 2014-02-27 MED ORDER — PENICILLIN V POTASSIUM 500 MG PO TABS: 500.0000 mg | ORAL_TABLET | Freq: Three times a day (TID) | ORAL | Status: DC

## 2014-02-27 NOTE — ED Provider Notes (Signed)
CSN: [id number]     Arrival date & time 02/27/14  0930 History   First MD Initiated Contact with Patient 02/27/14 [phone]     Chief Complaint  Patient presents with  . Lip Laceration     HPI  Patient presents after an injury last night. He slipped and fell and struck her lip against the edge of the toilet in her bathroom. Has a through and through laceration to her lower lip. Her upper teeth are painful but not broken or loose. No loss of consciousness. No neck or back pain. No malocclusion or dental trauma.  Past Medical History  Diagnosis Date  . Hypothyroid   . Migraine   . MVP (mitral valve prolapse)   . Opioid abuse, in remission   . Anxiety and depression    Past Surgical History  Procedure Laterality Date  . Knee arthroplasty    . Knee surgery    . Mouth surgery     Family History  Problem Relation Age of Onset  . Diabetes Other   . Hypertension Other   . Thyroid disease Other    History  Substance Use Topics  . Smoking status: Current Every Day Smoker -- 1.00 packs/day    Types: Cigarettes  . Smokeless tobacco: Not on file  . Alcohol Use: No   OB History   Grav Para Term Preterm Abortions TAB SAB Ect Mult Living                 Review of Systems  Constitutional: Negative for fever, chills, diaphoresis, appetite change and fatigue.  HENT: Positive for dental problem. Negative for mouth sores, sore throat and trouble swallowing.        Lip laceration  Eyes: Negative for visual disturbance.  Respiratory: Negative for cough, chest tightness, shortness of breath and wheezing.   Cardiovascular: Negative for chest pain.  Gastrointestinal: Negative for nausea, vomiting, abdominal pain, diarrhea and abdominal distention.  Endocrine: Negative for polydipsia, polyphagia and polyuria.  Genitourinary: Negative for dysuria, frequency and hematuria.  Musculoskeletal: Negative for gait problem.  Skin: Negative for color change, pallor and rash.  Neurological: Negative for  dizziness, syncope, light-headedness and headaches.  Hematological: Does not bruise/bleed easily.  Psychiatric/Behavioral: Negative for behavioral problems and confusion.      Allergies  Review of patient's allergies indicates no known allergies.  Home Medications   Prior to Admission medications   Medication Sig Start Date End Date Taking? Authorizing Provider  ibuprofen (ADVIL,MOTRIN) 200 MG tablet Take 800 mg by mouth every 6 (six) hours as needed for mild pain or moderate pain.   Yes Historical Provider, MD  Multiple Vitamin (MULTIVITAMIN WITH MINERALS) TABS tablet Take 1 tablet by mouth daily.   Yes Historical Provider, MD  penicillin v potassium (VEETID) 500 MG tablet Take 1 tablet (500 mg total) by mouth 3 (three) times daily. 02/27/14   Rolland Porter, MD   BP 131/80  Pulse 129  Temp(Src) 98.6 F (37 C) (Oral)  Resp 18  SpO2 100%  LMP 02/17/2014 Physical Exam  Constitutional: She is oriented to person, place, and time. She appears well-developed and well-nourished. No distress.  HENT:  Head: Normocephalic.  Mouth/Throat:    Eyes: Conjunctivae are normal. Pupils are equal, round, and reactive to light. No scleral icterus.  Neck: Normal range of motion. Neck supple. No thyromegaly present.  Cardiovascular: Normal rate and regular rhythm.  Exam reveals no gallop and no friction rub.   No murmur heard. Pulmonary/Chest: Effort normal and  breath sounds normal. No respiratory distress. She has no wheezes. She has no rales.  Abdominal: Soft. Bowel sounds are normal. She exhibits no distension. There is no tenderness. There is no rebound.  Musculoskeletal: Normal range of motion.  Neurological: She is alert and oriented to person, place, and time.  Skin: Skin is warm and dry. No rash noted.  Psychiatric: She has a normal mood and affect. Her behavior is normal.    ED Course  Procedures (including critical care time) Labs Review Labs Reviewed - No data to display  Imaging  Review No results found.   EKG Interpretation None      MDM   Final diagnoses:  Lip laceration, initial encounter  Anxiety    Wound is not absolutely require repair. She is greater than 12 hours since her injury. Her strong preference is not to repair it. This is reasonable. DC c Rx. penicillin. Basic wound care.    Rolland PorterMark Jorrell Kuster, MD 02/27/14 1001

## 2014-02-27 NOTE — ED Notes (Signed)
Pt slipped in bathtub last night.  Landed on front mouth.  Tooth painful and lip is lacerated inside and out.  Pt waited to seek treatment.  But wound continues to bleed and swelling increased.

## 2014-02-27 NOTE — Discharge Instructions (Signed)
Keep the wound clean. Swish and spit out water after eating to keep the laceration inside of the lip clean Apply antibiotic ointment to the external wound 3-4 x/day.  Wound Care Wound care helps prevent pain and infection.  You may need a tetanus shot if:  You cannot remember when you had your last tetanus shot.  You have never had a tetanus shot.  The injury broke your skin. If you need a tetanus shot and you choose not to have one, you may get tetanus. Sickness from tetanus can be serious. HOME CARE   Only take medicine as told by your doctor.  Clean the wound daily with mild soap and water.  Change any bandages (dressings) as told by your doctor.  Put medicated cream and a bandage on the wound as told by your doctor.  Change the bandage if it gets wet, dirty, or starts to smell.  Take showers. Do not take baths, swim, or do anything that puts your wound under water.  Rest and raise (elevate) the wound until the pain and puffiness (swelling) are better.  Keep all doctor visits as told. GET HELP RIGHT AWAY IF:   Yellowish-white fluid (pus) comes from the wound.  Medicine does not lessen your pain.  There is a red streak going away from the wound.  You have a fever. MAKE SURE YOU:   Understand these instructions.  Will watch your condition.  Will get help right away if you are not doing well or get worse. Document Released: 04/19/2008 Document Revised: 10/03/2011 Document Reviewed: 11/14/2010 [facility] Patient Information 2015 [employer]. This information is not intended to replace advice given to you by your health care provider. Make sure you discuss any questions you have with your health care provider.

## 2017-01-06 LAB — LAB REPORT - SCANNED: PREG TEST UR: POSITIVE

## 2017-02-23 ENCOUNTER — Encounter: Payer: Self-pay | Admitting: *Deleted

## 2017-03-15 ENCOUNTER — Encounter: Payer: Self-pay | Admitting: General Practice

## 2017-03-15 ENCOUNTER — Encounter: Payer: Self-pay | Admitting: Obstetrics & Gynecology

## 2017-03-28 ENCOUNTER — Inpatient Hospital Stay (HOSPITAL_COMMUNITY)
Admission: EM | Admit: 2017-03-28 | Discharge: 2017-03-29 | DRG: 781 | Disposition: A | Discharge status: Home / Self Care | Payer: Medicaid Other | Attending: Obstetrics and Gynecology | Admitting: Obstetrics and Gynecology

## 2017-03-28 ENCOUNTER — Emergency Department (HOSPITAL_COMMUNITY): Payer: Medicaid Other

## 2017-03-28 ENCOUNTER — Encounter (HOSPITAL_COMMUNITY): Payer: Self-pay | Admitting: Emergency Medicine

## 2017-03-28 DIAGNOSIS — N390 Urinary tract infection, site not specified: Secondary | ICD-10-CM

## 2017-03-28 DIAGNOSIS — O99332 Smoking (tobacco) complicating pregnancy, second trimester: Secondary | ICD-10-CM | POA: Diagnosis present

## 2017-03-28 DIAGNOSIS — B9689 Other specified bacterial agents as the cause of diseases classified elsewhere: Secondary | ICD-10-CM

## 2017-03-28 DIAGNOSIS — Z720 Tobacco use: Secondary | ICD-10-CM

## 2017-03-28 DIAGNOSIS — F1114 Opioid abuse with opioid-induced mood disorder: Secondary | ICD-10-CM | POA: Diagnosis present

## 2017-03-28 DIAGNOSIS — O2340 Unspecified infection of urinary tract in pregnancy, unspecified trimester: Secondary | ICD-10-CM

## 2017-03-28 DIAGNOSIS — O99342 Other mental disorders complicating pregnancy, second trimester: Principal | ICD-10-CM | POA: Diagnosis present

## 2017-03-28 DIAGNOSIS — O2342 Unspecified infection of urinary tract in pregnancy, second trimester: Secondary | ICD-10-CM | POA: Diagnosis present

## 2017-03-28 DIAGNOSIS — R1030 Lower abdominal pain, unspecified: Secondary | ICD-10-CM

## 2017-03-28 DIAGNOSIS — O0990 Supervision of high risk pregnancy, unspecified, unspecified trimester: Secondary | ICD-10-CM

## 2017-03-28 DIAGNOSIS — R109 Unspecified abdominal pain: Secondary | ICD-10-CM

## 2017-03-28 DIAGNOSIS — O09512 Supervision of elderly primigravida, second trimester: Secondary | ICD-10-CM | POA: Diagnosis present

## 2017-03-28 DIAGNOSIS — O26899 Other specified pregnancy related conditions, unspecified trimester: Secondary | ICD-10-CM

## 2017-03-28 DIAGNOSIS — R45851 Suicidal ideations: Secondary | ICD-10-CM

## 2017-03-28 DIAGNOSIS — F17210 Nicotine dependence, cigarettes, uncomplicated: Secondary | ICD-10-CM | POA: Diagnosis present

## 2017-03-28 DIAGNOSIS — N760 Acute vaginitis: Secondary | ICD-10-CM

## 2017-03-28 DIAGNOSIS — O09522 Supervision of elderly multigravida, second trimester: Secondary | ICD-10-CM

## 2017-03-28 DIAGNOSIS — F1123 Opioid dependence with withdrawal: Secondary | ICD-10-CM | POA: Diagnosis present

## 2017-03-28 DIAGNOSIS — F1910 Other psychoactive substance abuse, uncomplicated: Secondary | ICD-10-CM

## 2017-03-28 DIAGNOSIS — F1190 Opioid use, unspecified, uncomplicated: Secondary | ICD-10-CM | POA: Diagnosis present

## 2017-03-28 DIAGNOSIS — O0932 Supervision of pregnancy with insufficient antenatal care, second trimester: Secondary | ICD-10-CM

## 2017-03-28 DIAGNOSIS — O99322 Drug use complicating pregnancy, second trimester: Secondary | ICD-10-CM | POA: Diagnosis present

## 2017-03-28 DIAGNOSIS — O99282 Endocrine, nutritional and metabolic diseases complicating pregnancy, second trimester: Secondary | ICD-10-CM | POA: Diagnosis present

## 2017-03-28 DIAGNOSIS — Z3A22 22 weeks gestation of pregnancy: Secondary | ICD-10-CM

## 2017-03-28 DIAGNOSIS — F1193 Opioid use, unspecified with withdrawal: Secondary | ICD-10-CM | POA: Diagnosis present

## 2017-03-28 DIAGNOSIS — E039 Hypothyroidism, unspecified: Secondary | ICD-10-CM | POA: Diagnosis present

## 2017-03-28 LAB — COMPREHENSIVE METABOLIC PANEL
ALK PHOS: 118 U/L (ref 38–126)
ALT: 12 U/L — ABNORMAL LOW (ref 14–54)
ANION GAP: 10 (ref 5–15)
AST: 23 U/L (ref 15–41)
Albumin: 3.4 g/dL — ABNORMAL LOW (ref 3.5–5.0)
BUN: 10 mg/dL (ref 6–20)
CALCIUM: 9 mg/dL (ref 8.9–10.3)
CO2: 22 mmol/L (ref 22–32)
CREATININE: 0.65 mg/dL (ref 0.44–1.00)
Chloride: 103 mmol/L (ref 101–111)
GFR calc non Af Amer: >60 mL/min (ref >60)
GLUCOSE: 68 mg/dL (ref 65–99)
Potassium: 3.9 mmol/L (ref 3.5–5.1)
SODIUM: 135 mmol/L (ref 135–145)
TOTAL PROTEIN: 7.3 g/dL (ref 6.5–8.1)
Total Bilirubin: 0.2 mg/dL — ABNORMAL LOW (ref 0.3–1.2)

## 2017-03-28 LAB — URINALYSIS, ROUTINE W REFLEX MICROSCOPIC
Bilirubin Urine: NEGATIVE
Glucose, UA: NEGATIVE mg/dL
Hgb urine dipstick: NEGATIVE
KETONES UR: NEGATIVE mg/dL
Nitrite: POSITIVE — AB
PROTEIN: NEGATIVE mg/dL
Specific Gravity, Urine: 1.011 (ref 1.005–1.030)
pH: 6 (ref 5.0–8.0)

## 2017-03-28 LAB — RAPID URINE DRUG SCREEN, HOSP PERFORMED
Amphetamines: NOT DETECTED
BARBITURATES: NOT DETECTED
Benzodiazepines: NOT DETECTED
COCAINE: NOT DETECTED
Opiates: POSITIVE — AB
Tetrahydrocannabinol: NOT DETECTED

## 2017-03-28 LAB — PREGNANCY, URINE: PREG TEST UR: POSITIVE — AB

## 2017-03-28 LAB — CBC
HEMATOCRIT: 33.3 % — AB (ref 36.0–46.0)
Hemoglobin: 11.7 g/dL — ABNORMAL LOW (ref 12.0–15.0)
MCH: 31.5 pg (ref 26.0–34.0)
MCHC: 35.1 g/dL (ref 30.0–36.0)
MCV: 89.5 fL (ref 78.0–100.0)
Platelets: 212 10*3/uL (ref 150–400)
RBC: 3.72 MIL/uL — ABNORMAL LOW (ref 3.87–5.11)
RDW: 13.7 % (ref 11.5–15.5)
WBC: 9.7 10*3/uL (ref 4.0–10.5)

## 2017-03-28 LAB — ETHANOL: Alcohol, Ethyl (B): <5 mg/dL (ref <5)

## 2017-03-28 LAB — WET PREP, GENITAL
Sperm: NONE SEEN
TRICH WET PREP: NONE SEEN
YEAST WET PREP: NONE SEEN

## 2017-03-28 LAB — ACETAMINOPHEN LEVEL

## 2017-03-28 LAB — SALICYLATE LEVEL: Salicylate Lvl: <7 mg/dL (ref 2.8–30.0)

## 2017-03-28 LAB — HCG, QUANTITATIVE, PREGNANCY: HCG, BETA CHAIN, QUANT, S: 28358 m[IU]/mL — AB (ref <5)

## 2017-03-28 MED ORDER — CEFTRIAXONE SODIUM 250 MG IJ SOLR
250.0000 mg | Freq: Once | INTRAMUSCULAR | Status: AC
Start: 2017-03-28 — End: 2017-03-28
  Administered 2017-03-28: 250 mg via INTRAMUSCULAR
  Filled 2017-03-28: qty 250

## 2017-03-28 MED ORDER — ACETAMINOPHEN 325 MG PO TABS: 650.0000 mg | ORAL_TABLET | ORAL | Status: DC | PRN

## 2017-03-28 MED ORDER — STERILE WATER FOR INJECTION IJ SOLN
INTRAMUSCULAR | Status: AC
  Administered 2017-03-28: 0.9 mL
  Filled 2017-03-28: qty 10

## 2017-03-28 MED ORDER — NICOTINE 21 MG/24HR TD PT24
21.0000 mg | MEDICATED_PATCH | Freq: Every day | TRANSDERMAL | Status: DC
  Administered 2017-03-29: 21 mg via TRANSDERMAL
  Filled 2017-03-28 (×2): qty 1

## 2017-03-28 MED ORDER — AZITHROMYCIN 250 MG PO TABS
1000.0000 mg | ORAL_TABLET | Freq: Once | ORAL | Status: AC
  Administered 2017-03-28: 1000 mg via ORAL
  Filled 2017-03-28: qty 4

## 2017-03-28 MED ORDER — METRONIDAZOLE 500 MG PO TABS
500.0000 mg | ORAL_TABLET | Freq: Two times a day (BID) | ORAL | Status: DC
  Administered 2017-03-28 – 2017-03-29 (×2): 500 mg via ORAL
  Filled 2017-03-28 (×2): qty 1

## 2017-03-28 MED ORDER — ONDANSETRON HCL 4 MG PO TABS: 4.0000 mg | ORAL_TABLET | Freq: Three times a day (TID) | ORAL | Status: DC | PRN

## 2017-03-28 MED ORDER — ALUM & MAG HYDROXIDE-SIMETH 200-200-20 MG/5ML PO SUSP: 30.0000 mL | Freq: Four times a day (QID) | ORAL | Status: DC | PRN

## 2017-03-28 MED ORDER — CEPHALEXIN 500 MG PO CAPS
500.0000 mg | ORAL_CAPSULE | Freq: Two times a day (BID) | ORAL | Status: DC
  Administered 2017-03-28 – 2017-03-29 (×2): 500 mg via ORAL
  Filled 2017-03-28 (×2): qty 1

## 2017-03-28 NOTE — Progress Notes (Signed)
Received call regarding patient 5 months requesting detox. No prenatal care. Spoke to attending Dr Adrian BlackwaterStinson and advised to wait on ultrasound results before going to see patient. Will follow up once ultrasound is resulted. [facility]

## 2017-03-28 NOTE — BH Assessment (Addendum)
Assessment Note  Terri Oneal is an 36 y.o. female, who [facility]. Pt reported, she wants detox from heroin. Pt reported, she was initially addicted to pain pills. Pt reported, he doctor stopped refilling her prescription and in 2007 she began snorting heroin. Pt reported, she is currently injecting heroin with a syringe. Pt reported, she is five months pregnant and has not received prenatal care. Pt reported, she was in denial and her guilt did not allow her to focus on her baby. Pt reported, thinking about going through detox alone makes her suicidal with a plan to overdose. Pt reported, in 2012 she cut [place] wrist [place]. Pt reported, cutting her wrist was more of a cry for help than a suicide attempt. Pt denied, HI, AVH, self-injurious behaviors and access to weapons.   Pt denied abuse. Pt reported, using heroin around two this morning. Pt's UDS is positive for opiates. Pt reported, she is linked to Dr. Evelene CroonKaur for medication management. Pt reported, not taking her medications as prescribed. Pt reported, her last appointment with Dr. Evelene CroonKaur was about six months ago. Pt reported, previous inpatient admission.   Pt presents, alert in scrubs with logical/cohernet speech. Pt's eye contact was good. Pt's mood was depressed. Pt's affect was appropriate to circumstance. Pt's thought process was coherent/relevant. Pt's judgement was unimpaired. Pt's concentration was normal. Pt's insight was fair. Pt's impulse control was poor. Pt was oriented x4 (day, year, city and state.) Pt reported, [facility] she could not contract for safety. Pt reported, if inpatient treatment was recommended she would sign-in voluntary.   Diagnosis: Major Depressive Disorder, Recurrent Severe with Psychotic Features.                    Opioid Use Disorder, Severe  Past Medical History:  Past Medical History:  Diagnosis Date  .  Anxiety and depression   . Hypothyroid   . Migraine   . MVP (mitral valve prolapse)   . Opioid abuse, in remission     Past Surgical History:  Procedure Laterality Date  . KNEE ARTHROPLASTY    . KNEE SURGERY    . MOUTH SURGERY      Family History:  Family History  Problem Relation Age of Onset  . Diabetes Other   . Hypertension Other   . Thyroid disease Other     Social History:  reports that she has been smoking Cigarettes.  She has been smoking about 1.00 pack per day. She has never used smokeless tobacco. She reports that she does not drink alcohol or use drugs.  Additional Social History:  Alcohol / Drug Use Pain Medications: See MAR Prescriptions: See MAR Over the Counter: See MAR History of alcohol / drug use?: Yes Substance #1 Name of Substance 1: Opiates/Heroin 1 - Age of First Use: Pt reported, since 2007. Pt would have been 36 years old.  1 - Amount (size/oz): Unknown.  1 - Frequency: UA 1 - Duration: Ongoing 1 - Last Use / Amount: Pt reported, using this morning around 2am.   CIWA: CIWA-Ar BP: 115/76 Pulse Rate: 89 COWS:    Allergies: No Known Allergies  Home Medications:  (Not in a hospital admission)  OB/GYN Status:  No LMP recorded.  General Assessment Data Location of Assessment: [facility] TTS Assessment: In system Is this a Tele or Face-to-Face Assessment?: Face-to-Face Is this an Initial Assessment or a  Re-assessment for this encounter?: Initial Assessment Marital status: Single Is patient pregnant?: Yes Pregnancy Status: Yes (Comment: include estimated delivery date) (Pt is five months pregnant. ) Living Arrangements: Alone Can pt return to current living arrangement?: Yes Admission Status: Voluntary Is patient capable of signing voluntary admission?: Yes Referral Source: Self/Family/Friend Insurance type: Self-pay     Crisis Care Plan Living Arrangements: Alone Legal Guardian: Other: (Self) Name of Psychiatrist: Dr. Evelene Croon Name of  Therapist: NA  Education Status Is patient currently in school?: No Current Grade: NA Highest grade of school patient has completed: Pt reported, completing two Bachelor's Degrees. Name of school: NA Contact person: NA  Risk to self with the past 6 months Suicidal Ideation: Yes-Currently Present Has patient been a risk to self within the past 6 months prior to admission? : Yes Suicidal Intent: Yes-Currently Present Has patient had any suicidal intent within the past 6 months prior to admission? : Yes Is patient at risk for suicide?: Yes Suicidal Plan?: Yes-Currently Present Has patient had any suicidal plan within the past 6 months prior to admission? : Yes Specify Current Suicidal Plan: Pt reported, to overdose.  Access to Means: Yes Specify Access to Suicidal Means: Pt has access to drugs.  What has been your use of drugs/alcohol within the last 12 months?: Opiates.  Previous Attempts/Gestures: Yes How many times?: 1 Other Self Harm Risks: Past cutting.  Triggers for Past Attempts: Unknown Intentional Self Injurious Behavior: Cutting Comment - Self Injurious Behavior: Pt reported, cutting in the past nothing current.  Family Suicide History: No Recent stressful life event(s): Other (Comment) (on drugs while pregnant. ) Persecutory voices/beliefs?: No Depression: Yes Depression Symptoms: Feeling worthless/self pity, Loss of interest in usual pleasures, Guilt Substance abuse history and/or treatment for substance abuse?: Yes Suicide prevention information given to non-admitted patients: Not applicable  Risk to Others within the past 6 months Homicidal Ideation: No (Pt denies. ) Does patient have any lifetime risk of violence toward others beyond the six months prior to admission? : No Thoughts of Harm to Others: No Current Homicidal Intent: No Current Homicidal Plan: No Access to Homicidal Means: No Identified Victim: NA History of harm to others?: No Assessment of  Violence: None Noted Violent Behavior Description: Pt denies.  Does patient have access to weapons?: No Criminal Charges Pending?: No Does patient have a court date: No Is patient on probation?: No  Psychosis Hallucinations: None noted Delusions: None noted  Mental Status Report Appearance/Hygiene: In scrubs Eye Contact: Good Motor Activity: Unremarkable Speech: Logical/coherent Level of Consciousness: Alert Mood: Anxious, Depressed Affect: Appropriate to circumstance Anxiety Level: Minimal Thought Processes: Coherent, Relevant Judgement: Unimpaired Orientation: Other (Comment) (day, year, and state. ) Obsessive Compulsive Thoughts/Behaviors: None  Cognitive Functioning Concentration: Normal Memory: Recent Intact IQ: Average Insight: Fair Impulse Control: Poor Appetite: Good Sleep: No Change Total Hours of Sleep:  (Pt reported, 6-10 hours. ) Vegetative Symptoms: None  ADLScreening [facility] Assessment Services) Patient's cognitive ability adequate to safely complete daily activities?: Yes Patient able to express need for assistance with ADLs?: Yes Independently performs ADLs?: Yes (appropriate for developmental age)  Prior Inpatient Therapy Prior Inpatient Therapy: Yes Prior Therapy Dates: 2012  Prior Therapy Facilty/Provider(s): A faciility in [place]. Reason for Treatment: Pt reported, she was on a 72 hour hold for cutting her wrist.  Prior Outpatient Therapy Prior Outpatient Therapy: Yes Prior Therapy Dates: Current  Prior Therapy Facilty/Provider(s): Dr. Evelene Croon Reason for Treatment: Medication management Does patient have an ACCT team?: No Does patient  have Intensive In-House Services?  : No Does patient have [facility] services? : No Does patient have P4CC services?: No  ADL Screening (condition at time of admission) Patient's cognitive ability adequate to safely complete daily activities?: Yes Is the patient deaf or have difficulty hearing?: No Does the patient have  difficulty seeing, even when wearing glasses/contacts?: No Does the patient have difficulty concentrating, remembering, or making decisions?: No Patient able to express need for assistance with ADLs?: Yes Does the patient have difficulty dressing or bathing?: No Independently performs ADLs?: Yes (appropriate for developmental age) Does the patient have difficulty walking or climbing stairs?: No Weakness of Legs: None Weakness of Arms/Hands: None       Abuse/Neglect Assessment (Assessment to be complete while patient is alone) Physical Abuse: Denies (Pt denies. ) Verbal Abuse: Denies (Pt denies. ) Sexual Abuse: Denies (Pt denies. ) Exploitation of patient/patient's resources: Denies (Pt denies. ) Self-Neglect: Denies (Pt denies. )     Advance Directives (For Healthcare) Does Patient Have a Medical Advance Directive?: No Would patient like information on creating a medical advance directive?: No - Patient declined    Additional Information 1:1 In Past 12 Months?: No CIRT Risk: No Elopement Risk: No Does patient have medical clearance?: Yes     Disposition: Nira Conn, NP recommends inpatient treatment. Disposition discussed with Misty Stanley, RN and Carollee Herter, RN. TTS to seek placement. Per Hassie Bruce no appropriate beds available.    Disposition Initial Assessment Completed for this Encounter: Yes Disposition of Patient: Inpatient treatment program Type of inpatient treatment program: Adult  On Site Evaluation by:   Reviewed with Physician:  Misty Stanley, PA and Nira Conn, NP.  Redmond Pulling 03/29/2017 12:04 AM   Redmond Pulling, MS, [facility], CRC Triage Specialist [phone]

## 2017-03-28 NOTE — Progress Notes (Signed)
[id number] verified by ultrasound

## 2017-03-28 NOTE — BHH Counselor (Signed)
At 2340, clinician [place] [facility], to as a question about a possible report. Clinician noted that a Child psychotherapistsocial worker will contact her to answer her question.  At 2345, Clinician received a call from Angie, with APS, who also work with CPS for about seventeen years. Clinician asked, does a report need to be made if a parent has not received prenatal care and is abusing drugs. Clinician noted from Angie,  a report will need to be made after the baby is born.   Terri Pullingreylese D Janelle Culton, MS, [facility], [facility] Triage Specialist [phone]

## 2017-03-28 NOTE — ED Provider Notes (Signed)
[facility]-EMERGENCY DEPT Provider Note   CSN: [id number] Arrival date & time: 03/28/17  2005     History   Chief Complaint Chief Complaint  Patient presents with  . Suicidal    HPI Terri Oneal is a 36 y.o. female with a PMHx of mitral valve prolapse/heart murmur, opioid abuse, anxiety/depression, hypothyroidism, and migraines, who presents to the ED with complaints of suicidal ideations beginning at midnight since trying to detox from heroin and methadone. Patient was previously using heroin, went on methadone to get off heroin, however she attempted to "detox" herself from methadone over the last 1wk by using heroin. She hasn't had methadone in 1 week, but has been injecting heroin daily; she decided around midnight last night that she wanted to stop using, and has been trying to detox today, but has had worsening SI without a plan and is worried about what the detox could potentially do to her unborn fetus so she came here for help with these issues. She states that last month she had a +urine preg test at the methadone clinic, however has not yet gotten prenatal care, and isn't sure how far along she is. Her last menses was in the beginning of March, possibly 09/22/16 but she's not sure. (EGA based on that LMP would be 6329w6d). Since starting to withdraw from heroin, she's started having intermittent lower abd cramping, which she describes as 6/10 intermittent cramping in the lower abdomen, nonradiating, with no known factors, and improved after using heroin, with no other treatments tried prior to arrival. She states that she is sexually active with one female partner, unprotected. She admits to being a cigarette smoker, but denies HI, AVH, or alcohol use. She admits that she uses Molly occasionally, last use was 1 week ago. Denies any other illicit drug use. She has a psychiatrist Terri Oneal whom she sees, but is not currently on any psychiatric medications. She does not currently take any medications  at home. She denies fevers, chills, CP, SOB, N/V/D/C, hematuria, dysuria, vaginal bleeding/discharge, myalgias, arthralgias, numbness, tingling, focal weakness, or any other complaints at this time. She is here voluntarily.    The history is provided by the patient and medical records. No language interpreter was used.  Mental Health Problem  Presenting symptoms: suicidal thoughts   Presenting symptoms: no hallucinations and no homicidal ideas   Onset quality:  Gradual Duration:  1 day Timing:  Constant Progression:  Unchanged Chronicity:  New Context: drug abuse   Treatment compliance:  Untreated Relieved by:  None tried Worsened by:  Drugs Ineffective treatments:  None tried Associated symptoms: abdominal pain   Associated symptoms: no chest pain   Risk factors: hx of mental illness   Risk factors: no recent psychiatric admission     Past Medical History:  Diagnosis Date  . Anxiety and depression   . Hypothyroid   . Migraine   . MVP (mitral valve prolapse)   . Opioid abuse, in remission     There are no active problems to display for this patient.   Past Surgical History:  Procedure Laterality Date  . KNEE ARTHROPLASTY    . KNEE SURGERY    . MOUTH SURGERY      OB History    No data available       Home Medications    Prior to Admission medications   Medication Sig Start Date End Date Taking? Authorizing Provider  ibuprofen (ADVIL,MOTRIN) 200 MG tablet Take 800 mg by mouth every 6 (six)  hours as needed for mild pain or moderate pain.    [provider]  Multiple Vitamin (MULTIVITAMIN WITH MINERALS) TABS tablet Take 1 tablet by mouth daily.    [provider]  penicillin v potassium (VEETID) 500 MG tablet Take 1 tablet (500 mg total) by mouth 3 (three) times daily. 02/27/14   Rolland Porter, MD    Family History Family History  Problem Relation Age of Onset  . Diabetes Other   . Hypertension Other   . Thyroid disease Other     Social  History Social History  Substance Use Topics  . Smoking status: Current Every Day Smoker    Packs/day: 1.00    Types: Cigarettes  . Smokeless tobacco: Never Used  . Alcohol use No     Allergies   Patient has no known allergies.   Review of Systems Review of Systems  Constitutional: Negative for chills and fever.  Respiratory: Negative for shortness of breath.   Cardiovascular: Negative for chest pain.  Gastrointestinal: Positive for abdominal pain. Negative for constipation, diarrhea, nausea and vomiting.  Genitourinary: Negative for dysuria, hematuria, vaginal bleeding and vaginal discharge.  Musculoskeletal: Negative for arthralgias and myalgias.  Skin: Negative for color change.  Allergic/Immunologic: Negative for immunocompromised state.  Neurological: Negative for weakness and numbness.  Psychiatric/Behavioral: Positive for suicidal ideas. Negative for confusion, hallucinations and homicidal ideas.   All other systems reviewed and are negative for acute change except as noted in the HPI.    Physical Exam Updated Vital Signs BP (!) 132/95 (BP Location: Left Arm)   Pulse 94   Temp 98.6 F (37 C) (Oral)   Resp 20   Ht 5\' 3"  (1.6 m)   Wt 52.2 kg (115 lb)   SpO2 100%   BMI 20.37 kg/m   Physical Exam  Constitutional: She is oriented to person, place, and time. Vital signs are normal. She appears well-developed and well-nourished.  Non-toxic appearance. No distress.  Afebrile, nontoxic, NAD  HENT:  Head: Normocephalic and atraumatic.  Mouth/Throat: Oropharynx is clear and moist and mucous membranes are normal.  Eyes: Conjunctivae and EOM are normal. Right eye exhibits no discharge. Left eye exhibits no discharge.  Neck: Normal range of motion. Neck supple.  Cardiovascular: Normal rate, regular rhythm and intact distal pulses.  Exam reveals no gallop and no friction rub.   Murmur heard.  Systolic murmur is present  Loud harsh systolic crescendo-decrescendo murmur  heard best at right and left upper sternal borders. RRR, nl s1/s2, no rubs/gallops, distal pulses intact, no pedal edema   Pulmonary/Chest: Effort normal and breath sounds normal. No respiratory distress. She has no decreased breath sounds. She has no wheezes. She has no rhonchi. She has no rales.  Abdominal: Soft. Normal appearance and bowel sounds are normal. She exhibits no distension. There is tenderness in the suprapubic area. There is no rigidity, no rebound, no guarding, no CVA tenderness, no tenderness at McBurney's point and negative Murphy's sign.  Soft, nondistended, +BS throughout, with mild suprapubic TTP, no r/g/r, neg murphy's, neg mcburney's, no CVA TTP. Does not appear to have gravid abdomen.   Genitourinary: Pelvic exam was performed with patient supine. There is no rash, tenderness or lesion on the right labia. There is no rash, tenderness or lesion on the left labia. Uterus is enlarged. Uterus is not deviated, not fixed and not tender. Cervix exhibits discharge. Cervix exhibits no motion tenderness and no friability. Right adnexum displays no mass, no tenderness and no fullness. Left  adnexum displays tenderness. Left adnexum displays no mass and no fullness. No erythema, tenderness or bleeding in the vagina. Vaginal discharge found.  Genitourinary Comments: Chaperone present for exam. No rashes, lesions, or tenderness to external genitalia. No erythema, injury, or tenderness to vaginal mucosa. Mild greyish milky vaginal discharge without bleeding within vaginal vault. No adnexal masses or fullness, but with mild L adnexal TTP. No CMT or cervical friability, but mild greyish milky discharge from cervical os. Cervical os is closed. Uterus non-deviated, mobile, and nonTTP, but mildly enlarged/gravid to just above pubic symphysis however not as enlarged as one would expect for her possible EGA.    Musculoskeletal: Normal range of motion.  Neurological: She is alert and oriented to person,  place, and time. She has normal strength. No sensory deficit.  Skin: Skin is warm, dry and intact. No rash noted.  Psychiatric: Her mood appears anxious. She is not actively hallucinating. She exhibits a depressed mood. She expresses suicidal ideation. She expresses no homicidal ideation. She expresses no suicidal plans and no homicidal plans.  Anxious and depressed affect, but pleasant and cooperative. Endorsing SI without a plan, denies HI or AVH, doesn't seem to be responding to internal stimuli.   Nursing note and vitals reviewed.    ED Treatments / Results  Labs (all labs ordered are listed, but only abnormal results are displayed) Labs Reviewed  WET PREP, GENITAL - Abnormal; Notable for the following:       Result Value   Clue Cells Wet Prep HPF POC PRESENT (*)    WBC, Wet Prep HPF POC FEW (*)    All other components within normal limits  COMPREHENSIVE METABOLIC PANEL - Abnormal; Notable for the following:    Albumin 3.4 (*)    ALT 12 (*)    Total Bilirubin 0.2 (*)    All other components within normal limits  ACETAMINOPHEN LEVEL - Abnormal; Notable for the following:    Acetaminophen (Tylenol), Serum <10 (*)    All other components within normal limits  CBC - Abnormal; Notable for the following:    RBC 3.72 (*)    Hemoglobin 11.7 (*)    HCT 33.3 (*)    All other components within normal limits  RAPID URINE DRUG SCREEN, HOSP PERFORMED - Abnormal; Notable for the following:    Opiates POSITIVE (*)    All other components within normal limits  HCG, QUANTITATIVE, PREGNANCY - Abnormal; Notable for the following:    hCG, Beta Chain, Quant, S 28,358 (*)    All other components within normal limits  URINALYSIS, ROUTINE W REFLEX MICROSCOPIC - Abnormal; Notable for the following:    APPearance HAZY (*)    Nitrite POSITIVE (*)    Leukocytes, UA SMALL (*)    Bacteria, UA FEW (*)    Squamous Epithelial / LPF 0-5 (*)    All other components within normal limits  PREGNANCY, URINE -  Abnormal; Notable for the following:    Preg Test, Ur POSITIVE (*)    All other components within normal limits  URINE CULTURE  ETHANOL  SALICYLATE LEVEL  RPR  HIV ANTIBODY (ROUTINE TESTING)  GC/CHLAMYDIA PROBE AMP ([place]) NOT AT [facility]    EKG  EKG Interpretation None       Radiology No results found.  Procedures Procedures (including critical care time)  Medications Ordered in ED Medications  cephALEXin (KEFLEX) capsule 500 mg (not administered)  metroNIDAZOLE (FLAGYL) tablet 500 mg (not administered)  acetaminophen (TYLENOL) tablet 650 mg (not  administered)  ondansetron (ZOFRAN) tablet 4 mg (not administered)  alum & mag hydroxide-simeth (MAALOX/MYLANTA) 200-200-20 MG/5ML suspension 30 mL (not administered)  nicotine (NICODERM CQ - dosed in mg/24 hours) patch 21 mg (not administered)  azithromycin (ZITHROMAX) tablet 1,000 mg (1,000 mg Oral Given 03/28/17 2236)    And  cefTRIAXone (ROCEPHIN) injection 250 mg (250 mg Intramuscular Given 03/28/17 2237)  sterile water (preservative free) injection (0.9 mLs  Given 03/28/17 2237)     Initial Impression / Assessment and Plan / ED Course  I have reviewed the triage vital signs and the nursing notes.  Pertinent labs & imaging results that were available during my care of the patient were reviewed by me and considered in my medical decision making (see chart for details).     36 y.o. female here with SI without plan and requesting detox from heroin, methadone, and molly. Also reports +pregnancy test last month, unsure how far along she is, endorsing lower abd cramping today which she thought was due to detox. +Smoker, cessation advised. Denies HI/AVH. On exam, pleasant and cooperative, anxious and depressed appearing, heart murmur heard (pt states this is chronic), abdomen soft with mild suprapubic TTP but nonperitoneal, doesn't appear gravid, pelvic exam reveals mild greyish milky discharge from cervix, slightly enlarged uterus  but not to the level one would expect for how far along she claims to be based on LMP (EGA approx [redacted]w[redacted]d if LMP was 09/22/16; pt not sure of exact date); also with mild L adnexal tenderness. Work up thus far: UDS with +opiates, CBC with chronic stable anemia, acetaminophen and salicylate levels WNL, EtOH level undetectable, CMP WNL. Added on Upreg, U/A, quant betaHCG, wet prep, and STD panel. If confirmed to be pregnant, will get OB pelvic U/S to confirm IUP and r/o ectopic/torsion/etc. If not pregnant, would get non-OB pelvic U/S. Will empirically cover for GC/CT now, and reassess after remainder of work up completed.   11:06 PM U/A with +nitrites +leuks 0-5 squamous 6-30 WBCs and few bacteria; will send for UCx however will treat for UTI with keflex 500mg  BID x7 days (will order while here, she will need rx for this if she is discharged before 7 day course done); this could be the cause of her lower abd pain as well. Upreg+. Wet prep with +clue cells and few WBCs, will cover for BV as well with flagyl 500mg  BID x7 days (will order while here, she will need rx for this if she is discharged before 7 day course done). Quant HCG 28,358. Pelvic U/S pending. At this point, pt medically cleared aside from the U/S result. If U/S unremarkable and shows IUP without complicating factors, then she would be fully cleared. Patient care to be resumed by Sharilyn Sites, PA-C at shift change sign-out. Patient history has been discussed with midlevel resuming care. Please see their notes for further documentation of pending results and dispo/care. Pt stable at sign-out and updated on transfer of care. Psych hold orders and home med orders placed. Please see TTS notes for further documentation of care/dispo. PLEASE NOTE THAT PT IS HERE VOLUNTARILY AT THIS TIME, IF PT TRIES TO LEAVE THEY WOULD NEED IVC PAPERWORK TAKEN OUT. Pt stable at time of med clearance.     Final Clinical Impressions(s) / ED Diagnoses   Final diagnoses:    Abdominal pain in pregnancy  Suicidal ideation  Polysubstance abuse  Lower abdominal pain  Acute lower UTI  Tobacco user  BV (bacterial vaginosis)    New Prescriptions  New Prescriptions   No medications on [address] [place], [place] 03/28/17 2307    Gwyneth Sprout, MD 04/02/17 [phone]

## 2017-03-28 NOTE — ED Triage Notes (Signed)
Patient complaining of being suicidal. Patient states she is on [place], heroin, and methadone. Patient is trying to come off and is having thoughts of wanting to herself. Patient does not have a plan.

## 2017-03-28 NOTE — ED Notes (Signed)
OB Rapid Response RN called to assess pt.

## 2017-03-28 NOTE — ED Notes (Signed)
OB Rapid RN to be called once ultrasound results are transmitted.

## 2017-03-28 NOTE — ED Provider Notes (Signed)
  11:54 PM US completed-- single IUP.  There is mention of abnormal fetal head, however exam cannot serve as complete fetal anatomic survey.  Recommended non-emergent OB-GYN follow-up.  TTS has evaluated-- will seek IP placement.   Garlon HatchetSanders, Terriyah Westra M, PA-C 03/28/17 2358    Melene PlanFloyd, Dan, DO 03/29/17 [phone]

## 2017-03-29 ENCOUNTER — Encounter (HOSPITAL_COMMUNITY): Payer: Self-pay | Admitting: Obstetrics and Gynecology

## 2017-03-29 ENCOUNTER — Encounter: Payer: Self-pay | Admitting: Obstetrics and Gynecology

## 2017-03-29 DIAGNOSIS — E039 Hypothyroidism, unspecified: Secondary | ICD-10-CM | POA: Diagnosis present

## 2017-03-29 DIAGNOSIS — O99342 Other mental disorders complicating pregnancy, second trimester: Secondary | ICD-10-CM | POA: Diagnosis not present

## 2017-03-29 DIAGNOSIS — F1193 Opioid use, unspecified with withdrawal: Secondary | ICD-10-CM | POA: Diagnosis present

## 2017-03-29 DIAGNOSIS — F1114 Opioid abuse with opioid-induced mood disorder: Secondary | ICD-10-CM | POA: Diagnosis present

## 2017-03-29 DIAGNOSIS — Z3A21 21 weeks gestation of pregnancy: Secondary | ICD-10-CM

## 2017-03-29 DIAGNOSIS — O09512 Supervision of elderly primigravida, second trimester: Secondary | ICD-10-CM | POA: Diagnosis present

## 2017-03-29 DIAGNOSIS — Z720 Tobacco use: Secondary | ICD-10-CM | POA: Diagnosis present

## 2017-03-29 DIAGNOSIS — O99322 Drug use complicating pregnancy, second trimester: Secondary | ICD-10-CM | POA: Diagnosis not present

## 2017-03-29 DIAGNOSIS — F1123 Opioid dependence with withdrawal: Secondary | ICD-10-CM

## 2017-03-29 DIAGNOSIS — O99332 Smoking (tobacco) complicating pregnancy, second trimester: Secondary | ICD-10-CM | POA: Diagnosis present

## 2017-03-29 DIAGNOSIS — O0932 Supervision of pregnancy with insufficient antenatal care, second trimester: Secondary | ICD-10-CM | POA: Diagnosis not present

## 2017-03-29 DIAGNOSIS — O0990 Supervision of high risk pregnancy, unspecified, unspecified trimester: Secondary | ICD-10-CM

## 2017-03-29 DIAGNOSIS — R45851 Suicidal ideations: Secondary | ICD-10-CM

## 2017-03-29 DIAGNOSIS — Z3A22 22 weeks gestation of pregnancy: Secondary | ICD-10-CM | POA: Diagnosis not present

## 2017-03-29 DIAGNOSIS — O09522 Supervision of elderly multigravida, second trimester: Secondary | ICD-10-CM | POA: Diagnosis not present

## 2017-03-29 DIAGNOSIS — O99282 Endocrine, nutritional and metabolic diseases complicating pregnancy, second trimester: Secondary | ICD-10-CM | POA: Diagnosis present

## 2017-03-29 DIAGNOSIS — O2340 Unspecified infection of urinary tract in pregnancy, unspecified trimester: Secondary | ICD-10-CM

## 2017-03-29 DIAGNOSIS — O2342 Unspecified infection of urinary tract in pregnancy, second trimester: Secondary | ICD-10-CM | POA: Diagnosis present

## 2017-03-29 DIAGNOSIS — F17210 Nicotine dependence, cigarettes, uncomplicated: Secondary | ICD-10-CM

## 2017-03-29 DIAGNOSIS — F419 Anxiety disorder, unspecified: Secondary | ICD-10-CM

## 2017-03-29 DIAGNOSIS — F329 Major depressive disorder, single episode, unspecified: Secondary | ICD-10-CM

## 2017-03-29 DIAGNOSIS — F1190 Opioid use, unspecified, uncomplicated: Secondary | ICD-10-CM | POA: Diagnosis present

## 2017-03-29 LAB — TYPE AND SCREEN
ABO/RH(D): A POS
ANTIBODY SCREEN: NEGATIVE

## 2017-03-29 LAB — HEPATITIS B SURFACE ANTIGEN: Hepatitis B Surface Ag: NEGATIVE

## 2017-03-29 LAB — ABO/RH: ABO/RH(D): A POS

## 2017-03-29 LAB — RPR: RPR: NONREACTIVE

## 2017-03-29 LAB — HIV ANTIBODY (ROUTINE TESTING W REFLEX): HIV SCREEN 4TH GENERATION: NONREACTIVE

## 2017-03-29 LAB — TSH: TSH: 3.537 u[IU]/mL (ref 0.350–4.500)

## 2017-03-29 MED ORDER — DICYCLOMINE HCL 20 MG PO TABS
20.0000 mg | ORAL_TABLET | Freq: Four times a day (QID) | ORAL | Status: DC | PRN
  Filled 2017-03-29: qty 1

## 2017-03-29 MED ORDER — CLONIDINE HCL 0.1 MG PO TABS: 0.1000 mg | ORAL_TABLET | Freq: Every day | ORAL | Status: DC

## 2017-03-29 MED ORDER — DICYCLOMINE HCL 20 MG PO TABS
20.0000 mg | ORAL_TABLET | Freq: Four times a day (QID) | ORAL | Status: DC | PRN
  Administered 2017-03-29: 20 mg via ORAL
  Filled 2017-03-29 (×2): qty 1

## 2017-03-29 MED ORDER — ONDANSETRON 4 MG PO TBDP
4.0000 mg | ORAL_TABLET | Freq: Four times a day (QID) | ORAL | Status: DC | PRN
  Filled 2017-03-29: qty 1

## 2017-03-29 MED ORDER — METHOCARBAMOL 500 MG PO TABS
500.0000 mg | ORAL_TABLET | Freq: Three times a day (TID) | ORAL | Status: DC | PRN
  Filled 2017-03-29: qty 1

## 2017-03-29 MED ORDER — LOPERAMIDE HCL 2 MG PO CAPS
2.0000 mg | ORAL_CAPSULE | ORAL | Status: DC | PRN
  Filled 2017-03-29: qty 2

## 2017-03-29 MED ORDER — HYDROXYZINE HCL 25 MG PO TABS
25.0000 mg | ORAL_TABLET | Freq: Four times a day (QID) | ORAL | Status: DC | PRN
  Administered 2017-03-29: 25 mg via ORAL
  Filled 2017-03-29 (×2): qty 1

## 2017-03-29 MED ORDER — ACETAMINOPHEN 325 MG PO TABS: 650.0000 mg | ORAL_TABLET | ORAL | Status: DC | PRN

## 2017-03-29 MED ORDER — CLONIDINE HCL 0.1 MG PO TABS
0.1000 mg | ORAL_TABLET | Freq: Four times a day (QID) | ORAL | Status: DC
  Administered 2017-03-29: 0.1 mg via ORAL
  Filled 2017-03-29 (×4): qty 1

## 2017-03-29 MED ORDER — SODIUM CHLORIDE 0.9% FLUSH: 3.0000 mL | INTRAVENOUS | Status: DC | PRN

## 2017-03-29 MED ORDER — CLONIDINE HCL 0.1 MG PO TABS: 0.1000 mg | ORAL_TABLET | ORAL | Status: DC

## 2017-03-29 MED ORDER — LORAZEPAM 1 MG PO TABS
1.0000 mg | ORAL_TABLET | Freq: Four times a day (QID) | ORAL | Status: DC | PRN
  Administered 2017-03-29: 1 mg via ORAL
  Filled 2017-03-29: qty 1

## 2017-03-29 MED ORDER — HYDROXYZINE HCL 25 MG PO TABS
25.0000 mg | ORAL_TABLET | Freq: Four times a day (QID) | ORAL | Status: DC | PRN
  Filled 2017-03-29: qty 1

## 2017-03-29 MED ORDER — SODIUM CHLORIDE 0.9 % IV SOLN: 250.0000 mL | INTRAVENOUS | Status: DC | PRN

## 2017-03-29 MED ORDER — SULFAMETHOXAZOLE-TRIMETHOPRIM 800-160 MG PO TABS
1.0000 | ORAL_TABLET | Freq: Two times a day (BID) | ORAL | Status: DC
  Filled 2017-03-29 (×2): qty 1

## 2017-03-29 MED ORDER — SODIUM CHLORIDE 0.9% FLUSH: 3.0000 mL | Freq: Two times a day (BID) | INTRAVENOUS | Status: DC

## 2017-03-29 MED ORDER — CYCLOBENZAPRINE HCL 10 MG PO TABS
10.0000 mg | ORAL_TABLET | Freq: Once | ORAL | Status: AC
  Administered 2017-03-29: 10 mg via ORAL
  Filled 2017-03-29: qty 1

## 2017-03-29 MED ORDER — DOCUSATE SODIUM 100 MG PO CAPS: 100.0000 mg | ORAL_CAPSULE | Freq: Two times a day (BID) | ORAL | Status: DC | PRN

## 2017-03-29 MED ORDER — PRENATAL MULTIVITAMIN CH: 1.0000 | ORAL_TABLET | Freq: Every day | ORAL | Status: DC

## 2017-03-29 MED ORDER — SODIUM CHLORIDE 0.9 % IV SOLN: INTRAVENOUS | Status: DC

## 2017-03-29 MED ORDER — CALCIUM CARBONATE ANTACID 500 MG PO CHEW: 2.0000 | CHEWABLE_TABLET | ORAL | Status: DC | PRN

## 2017-03-29 NOTE — Progress Notes (Signed)
At 1948 patient restless, anxious and stating nothing is helping her.  Dr Emelda FearFerguson notified, order received and Ativan 1mg  po given at 1958.  Patient left AMA at 2045 ambulatory to elevator.

## 2017-03-29 NOTE — Progress Notes (Signed)
OB cleared.  

## 2017-03-29 NOTE — Progress Notes (Signed)
03/29/17  1638  Called [facility] for transport.

## 2017-03-29 NOTE — Progress Notes (Signed)
Final Report from ultrasound notes 4457w6d and not 28wk5d. Consulted with attending Dr Adrian BlackwaterStinson. Fetus not viable until 23wks. Doppler at 166 and 138. No contractions palpated. Uterus measures below umbilicus. Advised to follow up with an OB provider. No prenatal care. OBRR

## 2017-03-29 NOTE — Consult Note (Deleted)
[facility] Consult   Reason for Consult:  Suicidal Ideation Referring Physician:  EDP Patient Identification: Terri Oneal MRN:  [id number] Principal Diagnosis: Opioid abuse with opioid-induced mood disorder [facility]) Diagnosis:   Patient Active Problem List   Diagnosis Date Noted  . 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 Opioid abuse with opioid-induced mood disorder [facility]) [F11.14] 03/29/2017    Total Time spent with patient: 45 minutes  Subjective:   Terri Oneal is a 36 y.o. female patient admitted with substance induced suicidal ideations.  HPI:  Pt was seen and chart reviewed with Dr Darleene Cleaver. Today,  Pt denies suicidal/homicidal ideation, denies auditory/visual hallucinations and does not appear to be responding to internal stimuli.  Pt spent the night in the [facility] without incident. Pt Pt was seen by Peer Support and has resources for substance abuse treatment facilities. Pt is psychiatrically clear for discharge.   Past Psychiatric History: As above  Risk to Self: None Risk to Others: None Prior Inpatient Therapy: Prior Inpatient Therapy: Yes Prior Therapy Dates: 2012  Prior Therapy Facilty/Provider(s): A faciility in [place]. Reason for Treatment: Pt reported, she was on a 72 hour hold for cutting her wrist. Prior Outpatient Therapy: Prior Outpatient Therapy: Yes Prior Therapy Dates: Current  Prior Therapy Facilty/Provider(s): Dr. [facility] Reason for Treatment: Medication management Does patient have an ACCT team?: No Does patient have Intensive In-House Services?  : No Does patient have [facility] services? : No Does patient have P4CC services?: No  Past Medical History:  Past Medical History:  Diagnosis Date  . Anxiety and depression   . Hypothyroid   . Migraine   . MVP (mitral valve prolapse)   . Opioid abuse, in remission     Past Surgical History:  Procedure Laterality Date  . KNEE ARTHROPLASTY    . KNEE  SURGERY    . MOUTH SURGERY     Family History:  Family History  Problem Relation Age of Onset  . Diabetes Other   . Hypertension Other   . Thyroid disease Other    Family Psychiatric  History: Unknown Social History:  History  Alcohol Use No     History  Drug Use No    Comment: former opiates    Social History   Social History  . Marital status: Single    Spouse name: N/A  . Number of children: N/A  . Years of education: N/A   Social History Main Topics  . Smoking status: Current Every Day Smoker    Packs/day: 1.00    Types: Cigarettes  . Smokeless tobacco: Never Used  . Alcohol use No  . Drug use: No     Comment: former opiates  . Sexual activity: Not Asked   Other Topics Concern  . None   Social History Narrative  . None   Additional Social History:    Allergies:  No Known Allergies  Labs:  Results for orders placed or performed during the hospital encounter of 03/28/17 (from the past 48 hour(s))  Rapid urine drug screen (hospital performed)     Status: Abnormal   Collection Time: 03/28/17  8:46 PM  Result Value Ref Range   Opiates POSITIVE (A) NONE DETECTED   Cocaine NONE DETECTED NONE DETECTED   Benzodiazepines NONE DETECTED NONE DETECTED   Amphetamines NONE DETECTED NONE DETECTED   Tetrahydrocannabinol NONE DETECTED NONE DETECTED   Barbiturates NONE DETECTED NONE DETECTED    Comment:  DRUG SCREEN FOR MEDICAL PURPOSES ONLY.  IF CONFIRMATION IS NEEDED FOR ANY PURPOSE, NOTIFY LAB WITHIN 5 DAYS.        LOWEST DETECTABLE LIMITS FOR URINE DRUG SCREEN Drug Class       Cutoff (ng/mL) Amphetamine      1000 Barbiturate      200 Benzodiazepine   200 Tricyclics       300 Opiates          300 Cocaine          300 THC              50   Comprehensive metabolic panel     Status: Abnormal   Collection Time: 03/28/17  9:01 PM  Result Value Ref Range   Sodium 135 135 - 145 mmol/L   Potassium 3.9 3.5 - 5.1 mmol/L   Chloride 103 101 - 111 mmol/L    CO2 22 22 - 32 mmol/L   Glucose, Bld 68 65 - 99 mg/dL   BUN 10 6 - 20 mg/dL   Creatinine, Ser 3.64 0.44 - 1.00 mg/dL   Calcium 9.0 8.9 - 68.0 mg/dL   Total Protein 7.3 6.5 - 8.1 g/dL   Albumin 3.4 (L) 3.5 - 5.0 g/dL   AST 23 15 - 41 U/L   ALT 12 (L) 14 - 54 U/L   Alkaline Phosphatase 118 38 - 126 U/L   Total Bilirubin 0.2 (L) 0.3 - 1.2 mg/dL   GFR calc non Af Amer >60 >60 mL/min   GFR calc Af Amer >60 >60 mL/min    Comment: (NOTE) The eGFR has been calculated using the CKD EPI equation. This calculation has not been validated in all clinical situations. eGFR's persistently <60 mL/min signify possible Chronic Kidney Disease.    Anion gap 10 5 - 15  Ethanol     Status: None   Collection Time: 03/28/17  9:01 PM  Result Value Ref Range   Alcohol, Ethyl (B) <5 <5 mg/dL    Comment:        LOWEST DETECTABLE LIMIT FOR SERUM ALCOHOL IS 5 mg/dL FOR MEDICAL PURPOSES ONLY   Salicylate level     Status: None   Collection Time: 03/28/17  9:01 PM  Result Value Ref Range   Salicylate Lvl <7.0 2.8 - 30.0 mg/dL  Acetaminophen level     Status: Abnormal   Collection Time: 03/28/17  9:01 PM  Result Value Ref Range   Acetaminophen (Tylenol), Serum <10 (L) 10 - 30 ug/mL    Comment:        THERAPEUTIC CONCENTRATIONS VARY SIGNIFICANTLY. A RANGE OF 10-30 ug/mL MAY BE AN EFFECTIVE CONCENTRATION FOR MANY PATIENTS. HOWEVER, SOME ARE BEST TREATED AT CONCENTRATIONS OUTSIDE THIS RANGE. ACETAMINOPHEN CONCENTRATIONS >150 ug/mL AT 4 HOURS AFTER INGESTION AND >50 ug/mL AT 12 HOURS AFTER INGESTION ARE OFTEN ASSOCIATED WITH TOXIC REACTIONS.   cbc     Status: Abnormal   Collection Time: 03/28/17  9:01 PM  Result Value Ref Range   WBC 9.7 4.0 - 10.5 K/uL   RBC 3.72 (L) 3.87 - 5.11 MIL/uL   Hemoglobin 11.7 (L) 12.0 - 15.0 g/dL   HCT 32.1 (L) 22.4 - 82.5 %   MCV 89.5 78.0 - 100.0 fL   MCH 31.5 26.0 - 34.0 pg   MCHC 35.1 30.0 - 36.0 g/dL   RDW 00.3 70.4 - 88.8 %   Platelets 212 150 - 400 K/uL   hCG, quantitative, pregnancy     Status: Abnormal  Collection Time: 03/28/17  9:36 PM  Result Value Ref Range   hCG, Beta Chain, Quant, S 28,358 (H) <5 mIU/mL    Comment:          GEST. AGE      CONC.  (mIU/mL)   <=1 WEEK        5 - 50     2 WEEKS       50 - 500     3 WEEKS       100 - 10,000     4 WEEKS     1,000 - 30,000     5 WEEKS     3,500 - 115,000   6-8 WEEKS     12,000 - 270,000    12 WEEKS     15,000 - 220,000        FEMALE AND NON-PREGNANT FEMALE:     LESS THAN 5 mIU/mL   Wet prep, genital     Status: Abnormal   Collection Time: 03/28/17  9:36 PM  Result Value Ref Range   Yeast Wet Prep HPF POC NONE SEEN NONE SEEN   Trich, Wet Prep NONE SEEN NONE SEEN   Clue Cells Wet Prep HPF POC PRESENT (A) NONE SEEN   WBC, Wet Prep HPF POC FEW (A) NONE SEEN   Sperm NONE SEEN   Urinalysis, Routine w reflex microscopic     Status: Abnormal   Collection Time: 03/28/17  9:36 PM  Result Value Ref Range   Color, Urine YELLOW YELLOW   APPearance HAZY (A) CLEAR   Specific Gravity, Urine 1.011 1.005 - 1.030   pH 6.0 5.0 - 8.0   Glucose, UA NEGATIVE NEGATIVE mg/dL   Hgb urine dipstick NEGATIVE NEGATIVE   Bilirubin Urine NEGATIVE NEGATIVE   Ketones, ur NEGATIVE NEGATIVE mg/dL   Protein, ur NEGATIVE NEGATIVE mg/dL   Nitrite POSITIVE (A) NEGATIVE   Leukocytes, UA SMALL (A) NEGATIVE   RBC / HPF 0-5 0 - 5 RBC/hpf   WBC, UA 6-30 0 - 5 WBC/hpf   Bacteria, UA FEW (A) NONE SEEN   Squamous Epithelial / LPF 0-5 (A) NONE SEEN   Amorphous Crystal PRESENT   Pregnancy, urine     Status: Abnormal   Collection Time: 03/28/17  9:36 PM  Result Value Ref Range   Preg Test, Ur POSITIVE (A) NEGATIVE    Comment:        THE SENSITIVITY OF THIS METHODOLOGY IS >20 mIU/mL.     Current Facility-Administered Medications  Medication Dose Route Frequency Provider Last Rate Last Dose  . acetaminophen (TYLENOL) tablet 650 mg  650 mg Oral Q4H PRN [address], [place], [place]      . alum & mag hydroxide-simeth  (MAALOX/MYLANTA) 200-200-20 MG/5ML suspension 30 mL  30 mL Oral Q6H PRN [address], [place], [place]      . cephALEXin (KEFLEX) capsule 500 mg  500 mg Oral [address], [place], [place]   500 mg at 03/29/17 0940  . metroNIDAZOLE (FLAGYL) tablet 500 mg  500 mg Oral [address] [place], [place]   500 mg at 03/29/17 0940  . nicotine (NICODERM CQ - dosed in mg/24 hours) patch 21 mg  21 mg Transdermal Daily [address] [place], [place]   21 mg at 03/29/17 0940  . ondansetron (ZOFRAN) tablet 4 mg  4 mg Oral Q8H PRN [address], [place], [place]       Current Outpatient Prescriptions  Medication Sig Dispense Refill  . CRANBERRY PO Take 1 tablet by mouth daily.    Marland Kitchen  penicillin v potassium (VEETID) 500 MG tablet Take 1 tablet (500 mg total) by mouth 3 (three) times daily. (Patient not taking: Reported on 03/28/2017) 15 tablet 0    Musculoskeletal: Strength & Muscle Tone: within normal limits Gait & Station: normal Patient leans: N/A  Psychiatric Specialty Exam: Physical Exam  Constitutional: She is oriented to person, place, and time. She appears well-developed and well-nourished.  HENT:  Head: Normocephalic.  Respiratory: Effort normal.  Musculoskeletal: Normal range of motion.  Neurological: She is alert and oriented to person, place, and time.  Psychiatric: Her speech is normal. Judgment and thought content normal. Her mood appears anxious. She is agitated. Cognition and memory are normal. She exhibits a depressed mood.    Review of Systems  Psychiatric/Behavioral: Positive for depression and substance abuse. Negative for hallucinations, memory loss and suicidal ideas. The patient is not nervous/anxious and does not have insomnia.   All other systems reviewed and are negative.   Blood pressure 114/76, pulse 71, temperature 97.7 F (36.5 C), temperature source Oral, resp. rate 20, height '5\' 3"'$  (1.6 m), weight 52.2 kg (115 lb), SpO2 97 %.Body mass index is 20.37 kg/m.  General Appearance: Casual  Eye Contact:   Good  Speech:  Clear and Coherent and Normal Rate  Volume:  Normal  Mood:  Anxious and Depressed  Affect:  Congruent and Depressed  Thought Process:  Coherent, Goal Directed and Linear  Orientation:  Full (Time, Place, and Person)  Thought Content:  Logical  Suicidal Thoughts:  No  Homicidal Thoughts:  No  Memory:  Immediate;   Good Recent;   Good Remote;   Fair  Judgement:  Fair  Insight:  Fair  Psychomotor Activity:  Increased  Concentration:  Concentration: Good and Attention Span: Good  Recall:  Good  Fund of Knowledge:  Good  Language:  Good  Akathisia:  No  Handed:  Right  AIMS (if indicated):     Assets:  Agricultural consultant Housing Social Support  ADL's:  Intact  Cognition:  WNL  Sleep:        Treatment Plan Summary: Plan Substance Induced Mood Disorder  Follow up with outpatient substance abuse treatment facilities Take all medications as prescribed Avoid the use of alcohol and illicit drugs  Disposition: Discharge Home  No evidence of imminent risk to self or others at present.   Patient does not meet criteria for psychiatric inpatient admission. Supportive therapy provided about ongoing stressors. Discussed crisis plan, support from social network, calling 911, coming to the Emergency Department, and calling Suicide Hotline.  Ethelene Hal, NP 03/29/2017 11:23 AM

## 2017-03-29 NOTE — Progress Notes (Deleted)
Patient ID: Terri Oneal, female   DOB: 10/30/1980, 36 y.o.   MRN: [id number]  Patient seen to evaluate drainage site on left forearm, a 1 cm stab incision with small amount of swelling, no local heat or erythema located 5 cm from wrist on extensor surface obscured by tatoo. Pt has expressed all available purulence, none expressible now, so nothing to culture. Has received rocephin and Azitromycin since admit for STI. Will place on Septra DS BID. Pt instructed again on hand hygiene after wound contact. ADDENDUM:  WRONG CHART , WILL TRANSFER NOTE TO APPROPRIATE CHART.

## 2017-03-29 NOTE — Progress Notes (Signed)
03/29/17  1626  [facility] to give report [phone]. Waiting for a return call from Charge RN.

## 2017-03-29 NOTE — Consult Note (Signed)
[facility] Consult   Reason for Consult:  Depressed, heroin withdrawal Referring Physician:  EDP Patient Identification: Terri Oneal MRN:  [id number] Principal Diagnosis: Opioid abuse with opioid-induced mood disorder [facility]) Diagnosis:   Patient Active Problem List   Diagnosis Date Noted  . 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 Opioid abuse with opioid-induced mood disorder [facility]) [F11.14] 03/29/2017    Priority: High    Total Time spent with patient: 45 minutes  Subjective:   Terri Oneal is a 36 y.o. female patient admitted with depression, suicidal thought and opiate withdrawal  HPI:  Patient who is [redacted] weeks pregnant and reports history of MDD, Anxiety, Opioid dependence, Mitral valve prolapse/heart murmur, Hypothyroidism, and Migraines, who presents to [facility] with complaints of suicidal ideations and depression. Patient reports she started abusing Opioid since age 18 and was on 90 mg of Methadone until a week ago when she took herself off and self medicating with Heroin and Molly. She reports using 1/2 gram of Heroin daily for one week. Reports  she became suicidal and depressed yesterday because she was unable to detox herself from Opioid. Patient now requesting for detox from Opioid. Patient reports feeling nauseous, mild  myalgias, arthralgias, numbness and anxiety. She has not been taking medications for Depression or anxiety in the last few months  Past Psychiatric History: as above  Risk to Self: Suicidal Ideation: Yes-Currently Present Suicidal Intent: Yes-Currently Present Is patient at risk for suicide?: Yes Suicidal Plan?: Yes-Currently Present Specify Current Suicidal Plan: Pt reported, to overdose.  Access to Means: Yes Specify Access to Suicidal Means: Pt has access to drugs.  What has been your use of drugs/alcohol within the last 12 months?: Opiates.  How many times?: 1 Other Self  Harm Risks: Past cutting.  Triggers for Past Attempts: Unknown Intentional Self Injurious Behavior: Cutting Comment - Self Injurious Behavior: Pt reported, cutting in the past nothing current.  Risk to Others: Homicidal Ideation: No (Pt denies. ) Thoughts of Harm to Others: No Current Homicidal Intent: No Current Homicidal Plan: No Access to Homicidal Means: No Identified Victim: NA History of harm to others?: No Assessment of Violence: None Noted Violent Behavior Description: Pt denies.  Does patient have access to weapons?: No Criminal Charges Pending?: No Does patient have a court date: No Prior Inpatient Therapy: Prior Inpatient Therapy: Yes Prior Therapy Dates: 2012  Prior Therapy Facilty/Provider(s): A faciility in [place]. Reason for Treatment: Pt reported, she was on a 72 hour hold for cutting her wrist. Prior Outpatient Therapy: Prior Outpatient Therapy: Yes Prior Therapy Dates: Current  Prior Therapy Facilty/Provider(s): Dr. [facility] Reason for Treatment: Medication management Does patient have an ACCT team?: No Does patient have Intensive In-House Services?  : No Does patient have [facility] services? : No Does patient have P4CC services?: No  Past Medical History:  Past Medical History:  Diagnosis Date  . Anxiety and depression   . Hypothyroid   . Migraine   . MVP (mitral valve prolapse)   . Opioid abuse, in remission     Past Surgical History:  Procedure Laterality Date  . KNEE ARTHROPLASTY    . KNEE SURGERY    . MOUTH SURGERY     Family History:  Family History  Problem Relation Age of Onset  . Diabetes Other   . Hypertension Other   . Thyroid disease Other    Family Psychiatric  History: as above Social History:  History  Alcohol Use No     History  Drug Use No    Comment: former opiates    Social History   Social History  . Marital status: Single    Spouse name: N/A  . Number of children: N/A  . Years of education: N/A   Social History Main  Topics  . Smoking status: Current Every Day Smoker    Packs/day: 1.00    Types: Cigarettes  . Smokeless tobacco: Never Used  . Alcohol use No  . Drug use: No     Comment: former opiates  . Sexual activity: Not Asked   Other Topics Concern  . None   Social History Narrative  . None   Additional Social History:    Allergies:  No Known Allergies  Labs:  Results for orders placed or performed during the hospital encounter of 03/28/17 (from the past 48 hour(s))  Rapid urine drug screen (hospital performed)     Status: Abnormal   Collection Time: 03/28/17  8:46 PM  Result Value Ref Range   Opiates POSITIVE (A) NONE DETECTED   Cocaine NONE DETECTED NONE DETECTED   Benzodiazepines NONE DETECTED NONE DETECTED   Amphetamines NONE DETECTED NONE DETECTED   Tetrahydrocannabinol NONE DETECTED NONE DETECTED   Barbiturates NONE DETECTED NONE DETECTED    Comment:        DRUG SCREEN FOR MEDICAL PURPOSES ONLY.  IF CONFIRMATION IS NEEDED FOR ANY PURPOSE, NOTIFY LAB WITHIN 5 DAYS.        LOWEST DETECTABLE LIMITS FOR URINE DRUG SCREEN Drug Class       Cutoff (ng/mL) Amphetamine      1000 Barbiturate      200 Benzodiazepine   119 Tricyclics       417 Opiates          300 Cocaine          300 THC              50   Comprehensive metabolic panel     Status: Abnormal   Collection Time: 03/28/17  9:01 PM  Result Value Ref Range   Sodium 135 135 - 145 mmol/L   Potassium 3.9 3.5 - 5.1 mmol/L   Chloride 103 101 - 111 mmol/L   CO2 22 22 - 32 mmol/L   Glucose, Bld 68 65 - 99 mg/dL   BUN 10 6 - 20 mg/dL   Creatinine, Ser 0.65 0.44 - 1.00 mg/dL   Calcium 9.0 8.9 - 10.3 mg/dL   Total Protein 7.3 6.5 - 8.1 g/dL   Albumin 3.4 (L) 3.5 - 5.0 g/dL   AST 23 15 - 41 U/L   ALT 12 (L) 14 - 54 U/L   Alkaline Phosphatase 118 38 - 126 U/L   Total Bilirubin 0.2 (L) 0.3 - 1.2 mg/dL   GFR calc non Af Amer >60 >60 mL/min   GFR calc Af Amer >60 >60 mL/min    Comment: (NOTE) The eGFR has been  calculated using the CKD EPI equation. This calculation has not been validated in all clinical situations. eGFR's persistently <60 mL/min signify possible Chronic Kidney Disease.    Anion gap 10 5 - 15  Ethanol     Status: None   Collection Time: 03/28/17  9:01 PM  Result Value Ref Range   Alcohol, Ethyl (B) <5 <5 mg/dL    Comment:        LOWEST DETECTABLE LIMIT FOR SERUM ALCOHOL IS 5 mg/dL FOR MEDICAL PURPOSES ONLY  Salicylate level     Status: None   Collection Time: 03/28/17  9:01 PM  Result Value Ref Range   Salicylate Lvl <0.0 2.8 - 30.0 mg/dL  Acetaminophen level     Status: Abnormal   Collection Time: 03/28/17  9:01 PM  Result Value Ref Range   Acetaminophen (Tylenol), Serum <10 (L) 10 - 30 ug/mL    Comment:        THERAPEUTIC CONCENTRATIONS VARY SIGNIFICANTLY. A RANGE OF 10-30 ug/mL MAY BE AN EFFECTIVE CONCENTRATION FOR MANY PATIENTS. HOWEVER, SOME ARE BEST TREATED AT CONCENTRATIONS OUTSIDE THIS RANGE. ACETAMINOPHEN CONCENTRATIONS >150 ug/mL AT 4 HOURS AFTER INGESTION AND >50 ug/mL AT 12 HOURS AFTER INGESTION ARE OFTEN ASSOCIATED WITH TOXIC REACTIONS.   cbc     Status: Abnormal   Collection Time: 03/28/17  9:01 PM  Result Value Ref Range   WBC 9.7 4.0 - 10.5 K/uL   RBC 3.72 (L) 3.87 - 5.11 MIL/uL   Hemoglobin 11.7 (L) 12.0 - 15.0 g/dL   HCT 33.3 (L) 36.0 - 46.0 %   MCV 89.5 78.0 - 100.0 fL   MCH 31.5 26.0 - 34.0 pg   MCHC 35.1 30.0 - 36.0 g/dL   RDW 13.7 11.5 - 15.5 %   Platelets 212 150 - 400 K/uL  hCG, quantitative, pregnancy     Status: Abnormal   Collection Time: 03/28/17  9:36 PM  Result Value Ref Range   hCG, Beta Chain, Quant, S 28,358 (H) <5 mIU/mL    Comment:          GEST. AGE      CONC.  (mIU/mL)   <=1 WEEK        5 - 50     2 WEEKS       50 - 500     3 WEEKS       100 - 10,000     4 WEEKS     1,000 - 30,000     5 WEEKS     3,500 - 115,000   6-8 WEEKS     12,000 - 270,000    12 WEEKS     15,000 - 220,000        FEMALE AND NON-PREGNANT  FEMALE:     LESS THAN 5 mIU/mL   Wet prep, genital     Status: Abnormal   Collection Time: 03/28/17  9:36 PM  Result Value Ref Range   Yeast Wet Prep HPF POC NONE SEEN NONE SEEN   Trich, Wet Prep NONE SEEN NONE SEEN   Clue Cells Wet Prep HPF POC PRESENT (A) NONE SEEN   WBC, Wet Prep HPF POC FEW (A) NONE SEEN   Sperm NONE SEEN   Urinalysis, Routine w reflex microscopic     Status: Abnormal   Collection Time: 03/28/17  9:36 PM  Result Value Ref Range   Color, Urine YELLOW YELLOW   APPearance HAZY (A) CLEAR   Specific Gravity, Urine 1.011 1.005 - 1.030   pH 6.0 5.0 - 8.0   Glucose, UA NEGATIVE NEGATIVE mg/dL   Hgb urine dipstick NEGATIVE NEGATIVE   Bilirubin Urine NEGATIVE NEGATIVE   Ketones, ur NEGATIVE NEGATIVE mg/dL   Protein, ur NEGATIVE NEGATIVE mg/dL   Nitrite POSITIVE (A) NEGATIVE   Leukocytes, UA SMALL (A) NEGATIVE   RBC / HPF 0-5 0 - 5 RBC/hpf   WBC, UA 6-30 0 - 5 WBC/hpf   Bacteria, UA FEW (A) NONE SEEN   Squamous Epithelial / LPF 0-5 (A) NONE  SEEN   Amorphous Crystal PRESENT   Pregnancy, urine     Status: Abnormal   Collection Time: 03/28/17  9:36 PM  Result Value Ref Range   Preg Test, Ur POSITIVE (A) NEGATIVE    Comment:        THE SENSITIVITY OF THIS METHODOLOGY IS >20 mIU/mL.     Current Facility-Administered Medications  Medication Dose Route Frequency Provider Last Rate Last Dose  . acetaminophen (TYLENOL) tablet 650 mg  650 mg Oral Q4H PRN [address], [place], [place]      . alum & mag hydroxide-simeth (MAALOX/MYLANTA) 200-200-20 MG/5ML suspension 30 mL  30 mL Oral Q6H PRN [address], [place], [place]      . cephALEXin (KEFLEX) capsule 500 mg  500 mg Oral [address], [place], [place]   500 mg at 03/29/17 0940  . metroNIDAZOLE (FLAGYL) tablet 500 mg  500 mg Oral [address], [place], [place]   500 mg at 03/29/17 0940  . nicotine (NICODERM CQ - dosed in mg/24 hours) patch 21 mg  21 mg Transdermal Daily [address], [place], [place]   21 mg at 03/29/17 0940  . ondansetron  (ZOFRAN) tablet 4 mg  4 mg Oral Q8H PRN [address], [place], [place]       Current Outpatient Prescriptions  Medication Sig Dispense Refill  . CRANBERRY PO Take 1 tablet by mouth daily.    . penicillin v potassium (VEETID) 500 MG tablet Take 1 tablet (500 mg total) by mouth 3 (three) times daily. (Patient not taking: Reported on 03/28/2017) 15 tablet 0    Musculoskeletal: Strength & Muscle Tone: within normal limits Gait & Station: normal Patient leans: N/A  Psychiatric Specialty Exam: Physical Exam  Psychiatric: Her speech is normal and behavior is normal. Her mood appears anxious. Cognition and memory are normal. She expresses impulsivity. She exhibits a depressed mood. She expresses suicidal ideation.    Review of Systems  Constitutional: Positive for chills and malaise/fatigue.  HENT: Negative.   Eyes: Negative.   Respiratory: Negative.   Cardiovascular: Negative.   Gastrointestinal: Positive for abdominal pain and nausea.  Genitourinary: Negative.   Musculoskeletal: Positive for myalgias.  Skin: Negative.   Neurological: Negative.   Endo/Heme/Allergies: Negative.   Psychiatric/Behavioral: Positive for depression, substance abuse and suicidal ideas. The patient is nervous/anxious.     Blood pressure 114/76, pulse 71, temperature 97.7 F (36.5 C), temperature source Oral, resp. rate 20, height _0  (1.6 m), weight 52.2 kg (115 lb), SpO2 97 %.Body mass index is 20.37 kg/m.  General Appearance: Casual  Eye Contact:  Good  Speech:  Clear and Coherent  Volume:  Normal  Mood:  Anxious and Depressed  Affect:  Constricted  Thought Process:  Coherent and Descriptions of Associations: Intact  Orientation:  Full (Time, Place, and Person)  Thought Content:  Logical  Suicidal Thoughts:  No  Homicidal Thoughts:  No  Memory:  Immediate;   Good Recent;   Good Remote;   Good  Judgement:  Intact  Insight:  Shallow  Psychomotor Activity:  Psychomotor Retardation  Concentration:   Concentration: Fair and Attention Span: Fair  Recall:  [place] of Knowledge:  Good  Language:  Good  Akathisia:  No  Handed:  Right  AIMS (if indicated):     Assets:  Communication Skills Desire for Improvement  ADL's:  Intact  Cognition:  WNL  Sleep:   fair     Treatment Plan Summary: Patient who is [redacted] week pregnant with long history of Opioid use disorder who  needs detoxification. Patient needs urgent  medical detox that cannot be provided in a regular psychiatric setting. She will benefit from placement in [facility] hospital where she can be detox and her fetus monitored.   Disposition: Refer to [facility] hospital.  Corena Pilgrim, MD 03/29/2017 11:17 AM

## 2017-03-29 NOTE — ED Provider Notes (Signed)
Notified by psychiatry that pt does not need IVC, but they recommend inpatient tx for heroin detox given her pregnancy. I discussed with Dr. Vergie LivingPickens of OBGYN who accepted pt as transfer to [facility], but has requested that psychiatry consults on the pt as inpt and makes orders/recommendations re: opiate withdrawal treatment. Discussed with Dr. Jannifer FranklinAkintayo who states he will not make any recommendations/orders on this pt.  I called Dr. Shela CommonsJ, of the consult team, to discuss this case. He [facility] feel [facility] and making recommendations re: medical management of pt's withdrawal. Notified by Dr. Vergie LivingPickens who grac[facility]. On re-assessment, tp is having nausea, cramps from w/d but is in agreement with plan. She has had no VB or LOF.   Shaune PollackIsaacs, Saverio Kader, MD 03/29/17 [phone]

## 2017-03-29 NOTE — Discharge Summary (Signed)
Physician Discharge Summary  Patient ID: Terri RavensKelly R Bonine MRN: [id number] DOB/AGE: 38/07/1980 38 y.o.  Admit date: 03/28/2017 Discharge date: 03/29/2017  Admission Diagnoses:  Discharge Diagnoses:  Principal Problem:   Opioid abuse with opioid-induced mood disorder (HCC) Active Problems:   Heroin use affecting pregnancy in second trimester   Opioid withdrawal (HCC)   Supervision of high-risk pregnancy   AMA (advanced maternal age) primigravida 35+, second trimester   Hypothyroid   Tobacco abuse   UTI in pregnancy   Discharged Condition: fair  Hospital Course: 36 y.o. [id number] @ 22/0 by BPD u/s on 9/4, with the above CC. Pregnancy complicated by: h/o IV opiate abuse, AMA, hypothyroidism, UTI in pregnancy  Ms. Terri Oneal states that she stopped going to her methadone treatment center [place] b/c she was worried about the effect methadone would have on the child. She found out she was pregnant at the clinic approx 6wks ago and was only on methadone at the time. She states she last took 90mg  of methadone at the clinic a week ago but last used IV heroin a day ago.   HPI Terri Oneal is a 36 y.o. female with a PMHx of mitral valve prolapse/heart murmur, opioid abuse, anxiety/depression, hypothyroidism, and migraines, who presents to the ED with complaints of suicidal ideations beginning at midnight since trying to detox from heroin and methadone. Patient was previously using heroin, went on methadone to get off heroin, however she attempted to "detox" herself from methadone over the last 1wk by using heroin. She hasn't had methadone in 1 week, but has been injecting heroin daily; she decided around midnight last night that she wanted to stop using, and has been trying to detox today, but has had worsening SI without a plan and is worried about what the detox could potentially do to her unborn fetus so she came here for help with these issues. She states that last month  she had a +urine preg test at the methadone clinic, however has not yet gotten prenatal care, and isn't sure how far along she is. Her last menses was in the beginning of March, possibly 09/22/16 but she's not sure. (EGA based on that LMP would be [id number]). Since starting to withdraw from heroin, she's started having intermittent lower abd cramping, which she describes as 6/10 intermittent cramping in the lower abdomen, nonradiating, with no known factors, and improved after using heroin, with no other treatments tried prior to arrival. She states that she is sexually active with one female partner, unprotected. She admits to being a cigarette smoker, but denies HI, AVH, or alcohol use. She admits that she uses Molly occasionally, last use was 1 week ago. Denies any other illicit drug use. She has a psychiatrist Dr. Helane RimaKauer whom she sees, but is not currently on any psychiatric medications. She does not currently take any medications at home. She denies fevers, chills, CP, SOB, N/V/D/C, hematuria, dysuria, vaginal bleeding/discharge, myalgias, arthralgias, numbness, tingling, focal weakness, or any other complaints at this time. She is here voluntarily.  No decreased fetal movement or preterm labor s/s. She hasn't seen an OB provider for this pregnancy and her only u/s was in the ED on 9/4  She is admitted late on the afternoon of 03/28/2017, placed on appropriate withdrawal protocol here is the note from the emergency room on 03/28/2017 at 9 PM  Consults: psychiatry  Significant Diagnostic Studies: labs:  CBC    Component Value Date/Time   WBC 9.7 03/28/2017  2101   RBC 3.72 (L) 03/28/2017 2101   HGB 11.7 (L) 03/28/2017 2101   HCT 33.3 (L) 03/28/2017 2101   PLT 212 03/28/2017 2101   MCV 89.5 03/28/2017 2101   MCH 31.5 03/28/2017 2101   MCHC 35.1 03/28/2017 2101   RDW 13.7 03/28/2017 2101   LYMPHSABS 1.4 05/16/2010 1415   MONOABS 0.7 05/16/2010 1415   EOSABS 0.1 05/16/2010 1415   BASOSABS 0.0  05/16/2010 1415     Treatments: IV hydration and Clonidine protocol.   Discharge Exam: Blood pressure 105/72, pulse 72, temperature 99.5 F (37.5 C), temperature source Oral, resp. rate (!) 22, height 5' 2.99" (1.6 m), weight 115 lb (52.2 kg), SpO2 100 %. General appearance: alert, distracted and no distress agitated  Ultrasound reveals [redacted] week gestation on 03/28/2017  BPD:  5.3cm 21w  6d Full anatomic survey could not be completed and was scheduled for later this week but patient signed out AMA this evening at 9 PM after finding that Ativan and the clonidine withdrawal protocol "wasn't helping her withdrawal symptoms Disposition: 01-Home or Self Care  Discharge Instructions    Call MD for:  persistant nausea and vomiting    Complete by:  As directed    Call MD for:  severe uncontrolled pain    Complete by:  As directed    Diet - low sodium heart healthy    Complete by:  As directed    Increase activity slowly    Complete by:  As directed      Allergies as of 03/29/2017   No Known Allergies     Medication List    TAKE these medications   CRANBERRY PO Take 1 tablet by mouth daily.   penicillin v potassium 500 MG tablet Commonly known as:  VEETID Take 1 tablet (500 mg total) by mouth 3 (three) times daily.            Discharge Care Instructions        Start     Ordered   03/29/17 0000  Increase activity slowly     03/29/17 2111   03/29/17 0000  Diet - low sodium heart healthy     03/29/17 2111   03/29/17 0000  Call MD for:  severe uncontrolled pain     03/29/17 2111   03/29/17 0000  Call MD for:  persistant nausea and vomiting     03/29/17 2111     Follow-up Information    [facility] Follow up in 1 week(s).   Specialty:  Obstetrics and Gynecology Contact information: [address] [place] [address] [phone]         The patient signed out on the evening of 03/29/2017, finding the Ativan 1 mg every 6  hours did not give her relief from her withdrawal symptoms she pulled her IV out and left did not sign out formally but by her interactions Signed: Tilda Burrow 03/29/2017, 9:12 PM

## 2017-03-29 NOTE — H&P (Addendum)
Obstetrics Admission History & Physical  03/29/2017 - 6:00 PM Primary OBGYN: None  Chief Complaint: opiate withdrawal during pregnancy  History of Present Illness  36 y.o. [id number] @ 22/0 by BPD u/s on 9/4, with the above CC. Pregnancy complicated by: h/o IV opiate abuse, AMA, hypothyroidism, UTI in pregnancy  Terri Oneal states that she stopped going to her methadone treatment center (near [place] in [place]) b/c she was worried about the effect methadone would have on the child. She found out she was pregnant at the clinic approx 6wks ago and was only on methadone at the time. She states she last took 90mg  of methadone at the clinic a week ago but last used IV heroin a day ago.    No decreased fetal movement or preterm labor s/s. She hasn't seen an OB provider for this pregnancy and her only u/s was in the ED on 9/4  Review of Systems:  as noted in the History of Present Illness.  PMHx:  Past Medical History:  Diagnosis Date  . Anxiety and depression   . Hypothyroid   . Migraine   . MVP (mitral valve prolapse)   . Opioid abuse, in remission    PSHx:  Past Surgical History:  Procedure Laterality Date  . KNEE ARTHROPLASTY    . KNEE SURGERY    . MOUTH SURGERY     Medications:  None  Hospital Medications: Current Facility-Administered Medications  Medication Dose Route Frequency Provider Last Rate Last Dose  . 0.9 %  sodium chloride infusion  250 mL Intravenous PRN Basile Bing, MD      . acetaminophen (TYLENOL) tablet 650 mg  650 mg Oral Q4H PRN [place] Bing, MD      . calcium carbonate (TUMS - dosed in mg elemental calcium) chewable tablet 400 mg of elemental calcium  2 tablet Oral Q4H PRN [place] Bing, MD      . cloNIDine (CATAPRES) tablet 0.1 mg  0.1 mg Oral QID [place] Bing, MD       Followed by  . [START ON 04/01/2017] cloNIDine (CATAPRES) tablet 0.1 mg  0.1 mg Oral Nada Libman, MD       Followed by  . [START ON 04/03/2017] cloNIDine  (CATAPRES) tablet 0.1 mg  0.1 mg Oral QAC breakfast [place] Bing, MD      . dicyclomine (BENTYL) tablet 20 mg  20 mg Oral Q6H PRN [place] Bing, MD      . docusate sodium (COLACE) capsule 100 mg  100 mg Oral BID PRN [place] Bing, MD      . hydrOXYzine (ATARAX/VISTARIL) tablet 25 mg  25 mg Oral Q6H PRN [place] Bing, MD      . loperamide (IMODIUM) capsule 2-4 mg  2-4 mg Oral PRN [place] Bing, MD      . methocarbamol (ROBAXIN) tablet 500 mg  500 mg Oral Q8H PRN [place] Bing, MD      . ondansetron (ZOFRAN-ODT) disintegrating tablet 4 mg  4 mg Oral Q6H PRN [place] Bing, MD      . Melene Muller ON 03/30/2017] prenatal multivitamin tablet 1 tablet  1 tablet Oral Q1200 [place] Bing, MD      . sodium chloride flush (NS) 0.9 % injection 3 mL  3 mL Intravenous Q12H [place] Bing, MD      . sodium chloride flush (NS) 0.9 % injection 3 mL  3 mL Intravenous PRN [place] Bing, MD        Allergies: has No Known Allergies. OBHx:  OB  History  Gravida Para Term Preterm AB Living  3       2    SAB TAB Ectopic Multiple Live Births  2            # Outcome Date GA Lbr Len/2nd Weight Sex Delivery Anes PTL Lv  3 Gravida           2 SAB           1 SAB             Obstetric Comments  Early SAB x 2 (last one was approx 2016)             FHx:  Family History  Problem Relation Age of Onset  . Diabetes Other   . Hypertension Other   . Thyroid disease Other    Soc Hx:  Social History   Social History  . Marital status: Single    Spouse name: N/A  . Number of children: N/A  . Years of education: N/A   Occupational History  . Not on file.   Social History Main Topics  . Smoking status: Current Every Day Smoker    Packs/day: 1.00    Types: Cigarettes  . Smokeless tobacco: Never Used  . Alcohol use No  . Drug use: No     Comment: former opiates  . Sexual activity: Not on file   Other Topics Concern  . Not on file   Social History Narrative  . No narrative on  file  She states that she lives by herself and is currently working.   Objective    Current Vital Signs 24h Vital Sign Ranges  T 99.1 F (37.3 C) Temp  Avg: 98.7 F (37.1 C)  Min: 97.7 F (36.5 C)  Max: 99.1 F (37.3 C)  BP 113/75 BP  Min: 105/76  Max: 132/95  HR 68 Pulse  Avg: 79.2  Min: 68  Max: 94  RR 18 Resp  Avg: 19.3  Min: 18  Max: 20  SaO2 100 % Not Delivered SpO2  Avg: 98.5 %  Min: 96 %  Max: 100 %       24 Hour I/O Current Shift I/O  Time Ins Outs 09/04 0701 - 09/05 0700 In: 360 [P.O.:360] Out: -  09/05 0701 - 09/05 1900 In: 240 [P.O.:240] Out: -    General: Well nourished, well developed female in no acute distress.  Abdomen: gravid, nttp Neuro/Psych:  Normal mood and affect.   Labs  Negative: hiv, rpr, wet prep, apap/salicylate/ethanol level Pending: GC/CT  +nitrites on u/a, UDS with opiates  Recent Labs Lab 03/28/17 2101  WBC 9.7  HGB 11.7*  HCT 33.3*  PLT 212    Recent Labs Lab 03/28/17 2101  NA 135  K 3.9  CL 103  CO2 22  BUN 10  CREATININE 0.65  CALCIUM 9.0  PROT 7.3  BILITOT 0.2*  ALKPHOS 118  ALT 12*  AST 23  GLUCOSE 68    Radiology CLINICAL DATA:  Lower abdominal pain in cramping no prenatal care and history of drug use  EXAM: LIMITED OBSTETRIC ULTRASOUND AN DOPPLER  FINDINGS: Number of Fetuses: 1  Heart Rate:  169 bpm  Movement: Visualized  Presentation: Cephalic  Placental Location: Posterior  Previa: Not seen  Amniotic Fluid (Subjective):  Within normal limits.  BPD:  5.3cm 21w  6d  MATERNAL FINDINGS:  Cervix:  Appears closed.  Uterus/Adnexae: No abnormality visualized. Right ovary measures 3.4 x 2.8 x 2.8 cm. The  left ovary measures 2.4 x 2.1 x 2.1 cm.  Pulsed Doppler evaluation of both ovaries demonstrates normal low-resistance arterial and venous waveforms.  Other findings  No free fluid.  IMPRESSION: 1. Single intrauterine pregnancy as described above. 2. Normal ovaries with  no evidence for torsion 3. Sonographer reports that additional femur length was obtained due to abnormal appearing fetal head. The current exam was not intended to serves as a complete fetal anatomic survey and the images submitted are not sufficient to characterize any possible fetal anatomic abnormalities. Recommend follow-up nonemergent complete fetal anatomic survey.  This exam is performed on an emergent basis and does not comprehensively evaluate fetal size, dating, or anatomy; follow-up complete OB US should be considered if further fetal assessment is warranted.   Electronically Signed   By: Jasmine PangKim  Fujinaga M.D.   On: 03/28/2017 23:35  Assessment & Plan   36 y.o. [id number] @ 22/0 with opiate withdrawal, pt currently stable *Pregnancy: new OB labs. Needs anatomy u/s at some point prior to discharge *Opiate withdrawal: psych currently following and will see formally tomorrow; pt currently on clonidine protocol. Call [phone] o/n with any issues. HepC ordered D/w pt re: importance of staying clean and best for her to b on medications such as methadone, subutex, suboxone, and it's safe for her and the baby. NAAS d/w her *ID: treated with rocephin and azithro in the ED. UCx TOC in 4wks.  *hypothy: tsh  Terri Oneal, Jr. MD Attending Center [facility](Faculty Practice)

## 2017-03-29 NOTE — ED Notes (Signed)
Pt states she is starting to feel sick like she is about to go through withdrawals. VSS. Notified Dr. Bebe ShaggyWickline. No orders received at this time. Will notify Dr. Bebe ShaggyWickline if there is a change in pt's condition and VS.

## 2017-03-29 NOTE — Progress Notes (Signed)
03/29/17  1635  Report given to Berniece AndreasN Kaitlyn, RN. Patient going to Rm #[id number].

## 2017-03-30 ENCOUNTER — Encounter: Payer: Self-pay | Admitting: Obstetrics and Gynecology

## 2017-03-30 DIAGNOSIS — O0930 Supervision of pregnancy with insufficient antenatal care, unspecified trimester: Secondary | ICD-10-CM | POA: Insufficient documentation

## 2017-03-30 LAB — HEPATITIS C ANTIBODY (REFLEX)

## 2017-03-30 LAB — GC/CHLAMYDIA PROBE AMP (~~LOC~~) NOT AT ARMC
Chlamydia: NEGATIVE
NEISSERIA GONORRHEA: NEGATIVE

## 2017-03-30 LAB — HCV COMMENT:

## 2017-03-30 LAB — RUBELLA SCREEN: RUBELLA: 1.06 {index} (ref >0.99)

## 2017-03-30 NOTE — Consult Note (Signed)
  Psychiatric consultation received calls regarding assisting patient for opioid withdrawal treatment [facility], who [facility], informed that she will be seen today morning. Patient chart reviewed this morning including Dr. Jannifer FranklinAkintayo, psychiatrist from [facility]. As of this morning found that she was discharged from hospital [facility]. Please call us again if she returns for the same request.  [facility] Kaden Dunkel 03/30/2017 1:45 PM

## 2017-03-31 LAB — URINE CULTURE

## 2017-04-13 ENCOUNTER — Encounter: Payer: Self-pay | Admitting: Obstetrics & Gynecology

## 2017-04-27 ENCOUNTER — Encounter: Payer: Self-pay | Admitting: Obstetrics & Gynecology

## 2017-08-28 ENCOUNTER — Encounter (HOSPITAL_BASED_OUTPATIENT_CLINIC_OR_DEPARTMENT_OTHER): Payer: Self-pay | Admitting: Emergency Medicine

## 2017-08-28 ENCOUNTER — Emergency Department (HOSPITAL_BASED_OUTPATIENT_CLINIC_OR_DEPARTMENT_OTHER): Payer: Medicaid Other

## 2017-08-28 DIAGNOSIS — M25572 Pain in left ankle and joints of left foot: Secondary | ICD-10-CM | POA: Diagnosis not present

## 2017-08-28 DIAGNOSIS — F17210 Nicotine dependence, cigarettes, uncomplicated: Secondary | ICD-10-CM | POA: Insufficient documentation

## 2017-08-28 DIAGNOSIS — R2242 Localized swelling, mass and lump, left lower limb: Secondary | ICD-10-CM | POA: Insufficient documentation

## 2017-08-28 DIAGNOSIS — E079 Disorder of thyroid, unspecified: Secondary | ICD-10-CM | POA: Diagnosis not present

## 2017-08-28 DIAGNOSIS — M25472 Effusion, left ankle: Secondary | ICD-10-CM | POA: Insufficient documentation

## 2017-08-28 DIAGNOSIS — Z96659 Presence of unspecified artificial knee joint: Secondary | ICD-10-CM | POA: Diagnosis not present

## 2017-08-28 NOTE — ED Triage Notes (Signed)
C/o swelling to left ankle x 2 weeks-tightness to calf x today-denies injury-NAD-steady gait

## 2017-08-29 ENCOUNTER — Emergency Department (HOSPITAL_BASED_OUTPATIENT_CLINIC_OR_DEPARTMENT_OTHER)
Admission: EM | Admit: 2017-08-29 | Discharge: 2017-08-29 | Disposition: A | Discharge status: Home / Self Care | Payer: Medicaid Other | Attending: Emergency Medicine | Admitting: Emergency Medicine

## 2017-08-29 ENCOUNTER — Emergency Department (HOSPITAL_BASED_OUTPATIENT_CLINIC_OR_DEPARTMENT_OTHER): Payer: Medicaid Other

## 2017-08-29 DIAGNOSIS — M25472 Effusion, left ankle: Secondary | ICD-10-CM

## 2017-08-29 HISTORY — DX: Other psychoactive substance abuse, uncomplicated: F19.10

## 2017-08-29 MED ORDER — CEPHALEXIN 500 MG PO CAPS: 500.0000 mg | ORAL_CAPSULE | Freq: Four times a day (QID) | ORAL | 0 refills | Status: DC

## 2017-08-29 NOTE — Discharge Instructions (Signed)
Keflex as prescribed.  Up with your primary doctor if symptoms are not improving in the next week.

## 2017-08-29 NOTE — ED Provider Notes (Signed)
[facility] EMERGENCY DEPARTMENT Provider Note   CSN: [id number] Arrival date & time: 08/28/17  2251     History   Chief Complaint Chief Complaint  Patient presents with  . Leg Swelling    HPI France RavensKelly R Hedden is a 37 y.o. female.  Patient is a 37 year old female with past medical history of IV drug abuse presenting for evaluation of right ankle pain and swelling.  This began 2 weeks ago in the absence of any injury or trauma.  She denies any fevers or chills.  She denies any chest pain or difficulty breathing.  This morning when she woke up, it appeared red, then as the day went on the redness dissipated, but the swelling remains.   The history is provided by the patient.    Past Medical History:  Diagnosis Date  . Anxiety and depression   . Hypothyroid   . Migraine   . MVP (mitral valve prolapse)   . Opioid abuse, in remission (HCC)   . Polysubstance abuse [facility](HCC)     Patient Active Problem List   Diagnosis Date Noted  . No prenatal care in current pregnancy 03/30/2017  . Heroin use affecting pregnancy in second trimester 03/29/2017  . Opioid abuse with opioid-induced mood disorder (HCC) 03/29/2017  . Opioid withdrawal (HCC) 03/29/2017  . Supervision of high-risk pregnancy 03/29/2017  . AMA (advanced maternal age) primigravida 7235+, second trimester 03/29/2017  . Hypothyroid 03/29/2017  . Tobacco abuse 03/29/2017  . UTI in pregnancy 03/29/2017    Past Surgical History:  Procedure Laterality Date  . KNEE ARTHROPLASTY    . KNEE SURGERY    . MOUTH SURGERY      OB History    Gravida Para Term Preterm AB Living   3       2     SAB TAB Ectopic Multiple Live Births   2              Obstetric Comments   Early SAB x 2 (last one was approx 2016)       Home Medications    Prior to Admission medications   Not on File    Family History Family History  Problem Relation Age of Onset  . Diabetes Other   . Hypertension Other   . Thyroid disease  Other     Social History Social History   Tobacco Use  . Smoking status: Current Every Day Smoker    Packs/day: 1.00    Types: Cigarettes  . Smokeless tobacco: Never Used  Substance Use Topics  . Alcohol use: No  . Drug use: Yes    Types: Heroin    Comment: former opiates. Last used heroin on 03/28/2017     Allergies   Patient has no known allergies.   Review of Systems Review of Systems  All other systems reviewed and are negative.    Physical Exam Updated Vital Signs BP 131/86 (BP Location: Left Arm)   Pulse 93   Temp 99.8 F (37.7 C) (Oral)   Resp 18   Ht 5\' 3"  (1.6 m)   Wt 57.5 kg (126 lb 12.2 oz)   LMP 08/21/2017   SpO2 97%   BMI 22.46 kg/m   Physical Exam  Constitutional: She is oriented to person, place, and time. She appears well-developed and well-nourished. No distress.  HENT:  Head: Normocephalic and atraumatic.  Neck: Normal range of motion. Neck supple.  Pulmonary/Chest: Effort normal.  Musculoskeletal: Normal range of motion.  The left  ankle has mild swelling adjacent to the lateral malleolus.  She has good range of motion of the ankle and it appears stable on exam.  There is no calf tenderness and Homans sign is absent bilaterally.  Neurological: She is alert and oriented to person, place, and time.  Skin: Skin is warm and dry. She is not diaphoretic.  Nursing note and vitals reviewed.    ED Treatments / Results  Labs (all labs ordered are listed, but only abnormal results are displayed) Labs Reviewed - No data to display  EKG  EKG Interpretation None       Radiology US Venous Img Lower Unilateral Left  Result Date: 08/29/2017 CLINICAL DATA:  Left ankle pain and swelling with calf tightness EXAM: Left LOWER EXTREMITY VENOUS DOPPLER ULTRASOUND TECHNIQUE: Gray-scale sonography with graded compression, as well as color Doppler and duplex ultrasound were performed to evaluate the lower extremity deep venous systems from the level of the  common femoral vein and including the common femoral, femoral, profunda femoral, popliteal and calf veins including the posterior tibial, peroneal and gastrocnemius veins when visible. The superficial great saphenous vein was also interrogated. Spectral Doppler was utilized to evaluate flow at rest and with distal augmentation maneuvers in the common femoral, femoral and popliteal veins. COMPARISON:  None. FINDINGS: Contralateral Common Femoral Vein: Respiratory phasicity is normal and symmetric with the symptomatic side. No evidence of thrombus. Normal compressibility. Common Femoral Vein: No evidence of thrombus. Normal compressibility, respiratory phasicity and response to augmentation. Saphenofemoral Junction: No evidence of thrombus. Normal compressibility and flow on color Doppler imaging. Profunda Femoral Vein: No evidence of thrombus. Normal compressibility and flow on color Doppler imaging. Femoral Vein: No evidence of thrombus. Normal compressibility, respiratory phasicity and response to augmentation. Popliteal Vein: No evidence of thrombus. Normal compressibility, respiratory phasicity and response to augmentation. Calf Veins: No evidence of thrombus. Normal compressibility and flow on color Doppler imaging. Superficial Great Saphenous Vein: No evidence of thrombus. Normal compressibility. Venous Reflux:  None. Other Findings: Tiny nonspecific fluid collections within the soft tissues lateral to the left ankle measuring 5 mm and 12 mm. IMPRESSION: No evidence of deep venous thrombosis. Small nonspecific fluid collections in the soft tissues lateral to the left ankle Electronically Signed   By: Jasmine Pang M.D.   On: 08/29/2017 00:01    Procedures Procedures (including critical care time)  Medications Ordered in ED Medications - No data to display   Initial Impression / Assessment and Plan / ED Course  I have reviewed the triage vital signs and the nursing notes.  Pertinent labs & imaging  results that were available during my care of the patient were reviewed by me and considered in my medical decision making (see chart for details).  Patient with history of IV drug abuse in recovery presenting with left ankle pain and swelling that is atraumatic.  She does have swelling around the left lateral malleolus.  I am uncertain as to what has caused this, however there is some warmth to palpation.  I will treat with antibiotics for possible cellulitis and have her follow-up as needed.  Her ultrasound is negative for DVT and PMS is intact.  Final Clinical Impressions(s) / ED Diagnoses   Final diagnoses:  None    ED Discharge Orders    None       Geoffery Lyons, MD 08/29/17 [phone]

## 2017-08-29 NOTE — ED Notes (Signed)
PT discharged to home NAD.  

## 2017-08-29 NOTE — ED Notes (Signed)
Patient transported to X-ray 

## 2018-06-19 ENCOUNTER — Encounter (HOSPITAL_COMMUNITY): Payer: Self-pay

## 2018-06-19 ENCOUNTER — Emergency Department (HOSPITAL_COMMUNITY)
Admission: EM | Admit: 2018-06-19 | Discharge: 2018-06-19 | Disposition: A | Discharge status: Home / Self Care | Payer: Medicaid Other | Attending: Emergency Medicine | Admitting: Emergency Medicine

## 2018-06-19 DIAGNOSIS — Z79899 Other long term (current) drug therapy: Secondary | ICD-10-CM | POA: Insufficient documentation

## 2018-06-19 DIAGNOSIS — E039 Hypothyroidism, unspecified: Secondary | ICD-10-CM | POA: Insufficient documentation

## 2018-06-19 DIAGNOSIS — L03114 Cellulitis of left upper limb: Secondary | ICD-10-CM | POA: Insufficient documentation

## 2018-06-19 DIAGNOSIS — L0291 Cutaneous abscess, unspecified: Secondary | ICD-10-CM

## 2018-06-19 DIAGNOSIS — F17210 Nicotine dependence, cigarettes, uncomplicated: Secondary | ICD-10-CM | POA: Insufficient documentation

## 2018-06-19 MED ORDER — SULFAMETHOXAZOLE-TRIMETHOPRIM 800-160 MG PO TABS
1.0000 | ORAL_TABLET | Freq: Once | ORAL | Status: AC
  Administered 2018-06-19: 1 via ORAL
  Filled 2018-06-19: qty 1

## 2018-06-19 MED ORDER — SULFAMETHOXAZOLE-TRIMETHOPRIM 800-160 MG PO TABS: 1.0000 | ORAL_TABLET | Freq: Two times a day (BID) | ORAL | 0 refills | Status: AC

## 2018-06-19 MED ORDER — IBUPROFEN 600 MG PO TABS: 600.0000 mg | ORAL_TABLET | Freq: Four times a day (QID) | ORAL | 0 refills | Status: DC | PRN

## 2018-06-19 MED ORDER — IBUPROFEN 200 MG PO TABS
600.0000 mg | ORAL_TABLET | Freq: Once | ORAL | Status: AC
  Administered 2018-06-19: 600 mg via ORAL
  Filled 2018-06-19: qty 3

## 2018-06-19 MED ORDER — LIDOCAINE HCL (PF) 1 % IJ SOLN
5.0000 mL | Freq: Once | INTRAMUSCULAR | Status: AC
  Administered 2018-06-19: 5 mL
  Filled 2018-06-19: qty 30

## 2018-06-19 NOTE — ED Triage Notes (Signed)
Pt presents with c/o infection to her right arm. Pt has a very large, swollen, red, hot area to her left arm. Pt reports it is from shooting heroin but she is 4 days clean at this time.

## 2018-06-19 NOTE — ED Provider Notes (Signed)
[facility]-EMERGENCY DEPT Provider Note   CSN: [id number] Arrival date & time: 06/19/18  1227     History   Chief Complaint Chief Complaint  Patient presents with  . Cellulitis    HPI Terri Oneal is a 37 y.o. female.  Pt presents to the ED today with a red spot on her left arm.  She has a hx of heroin abuse and says it is from that.  She is trying to get clean and has been sober for 4 days.  The pt denies f/c.       Past Medical History:  Diagnosis Date  . Anxiety and depression   . Hypothyroid   . Migraine   . MVP (mitral valve prolapse)   . Opioid abuse, in remission (HCC)   . Polysubstance abuse [facility](HCC)     Patient Active Problem List   Diagnosis Date Noted  . No prenatal care in current pregnancy 03/30/2017  . Heroin use affecting pregnancy in second trimester 03/29/2017  . Opioid abuse with opioid-induced mood disorder (HCC) 03/29/2017  . Opioid withdrawal (HCC) 03/29/2017  . Supervision of high-risk pregnancy 03/29/2017  . AMA (advanced maternal age) primigravida 2035+, second trimester 03/29/2017  . Hypothyroid 03/29/2017  . Tobacco abuse 03/29/2017  . UTI in pregnancy 03/29/2017    Past Surgical History:  Procedure Laterality Date  . KNEE ARTHROPLASTY    . KNEE SURGERY    . MOUTH SURGERY       OB History    Gravida  3   Para      Term      Preterm      AB  2   Living        SAB  2   TAB      Ectopic      Multiple      Live Births           Obstetric Comments  Early SAB x 2 (last one was approx 2016)         Home Medications    Prior to Admission medications   Medication Sig Start Date End Date Taking? Authorizing Provider  acetaminophen (TYLENOL) 325 MG tablet Take 975 mg by mouth every 6 (six) hours as needed for mild pain or headache.   Yes [provider]  levothyroxine (LEVOXYL) 75 MCG tablet Take 75 mcg by mouth daily before breakfast.   Yes [provider]  cephALEXin  (KEFLEX) 500 MG capsule Take 1 capsule (500 mg total) by mouth 4 (four) times daily. Patient not taking: Reported on 06/19/2018 08/29/17   Geoffery Lyonselo, Douglas, MD  ibuprofen (ADVIL,MOTRIN) 600 MG tablet Take 1 tablet (600 mg total) by mouth every 6 (six) hours as needed. 06/19/18   Jacalyn LefevreHaviland, Fatumata Kashani, MD  sulfamethoxazole-trimethoprim (BACTRIM DS,SEPTRA DS) 800-160 MG tablet Take 1 tablet by mouth 2 (two) times daily for 7 days. 06/19/18 06/26/18  Jacalyn LefevreHaviland, Korrie Hofbauer, MD    Family History Family History  Problem Relation Age of Onset  . Diabetes Other   . Hypertension Other   . Thyroid disease Other     Social History Social History   Tobacco Use  . Smoking status: Current Every Day Smoker    Packs/day: 1.00    Types: Cigarettes  . Smokeless tobacco: Never Used  Substance Use Topics  . Alcohol use: No  . Drug use: Yes    Types: Heroin    Comment: former opiates. Last used heroin on 03/28/2017  Allergies   Patient has no known allergies.   Review of Systems Review of Systems  Skin: Positive for wound.  All other systems reviewed and are negative.    Physical Exam Updated Vital Signs BP 116/90 (BP Location: Right Arm)   Pulse (!) 112   Temp 98.5 F (36.9 C) (Oral)   Resp 18   Ht 5\' 3"  (1.6 m)   Wt 54.4 kg   LMP 05/20/2018 (Approximate)   SpO2 97%   BMI 21.26 kg/m   Physical Exam  Constitutional: She is oriented to person, place, and time. She appears well-developed and well-nourished.  HENT:  Head: Normocephalic and atraumatic.  Right Ear: External ear normal.  Left Ear: External ear normal.  Nose: Nose normal.  Mouth/Throat: Oropharynx is clear and moist.  Eyes: Pupils are equal, round, and reactive to light. Conjunctivae and EOM are normal.  Neck: Normal range of motion. Neck supple.  Cardiovascular: Normal rate, regular rhythm, normal heart sounds and intact distal pulses.  Pulmonary/Chest: Effort normal and breath sounds normal.  Abdominal: Soft. Bowel sounds are  normal.  Musculoskeletal: Normal range of motion.  Neurological: She is alert and oriented to person, place, and time.  Skin: Skin is warm. Capillary refill takes less than 2 seconds.  Abscess to left forearm. Multiple track marks  Psychiatric: She has a normal mood and affect. Her behavior is normal. Judgment and thought content normal.  Nursing note and vitals reviewed.    ED Treatments / Results  Labs (all labs ordered are listed, but only abnormal results are displayed) Labs Reviewed - No data to display  EKG None  Radiology No results found.  Procedures .Marland KitchenIncision and Drainage Date/Time: 06/19/2018 2:09 PM Performed by: Jacalyn Lefevre, MD Authorized by: Jacalyn Lefevre, MD   Consent:    Consent obtained:  Verbal   Consent given by:  Patient   Risks discussed:  Bleeding and incomplete drainage   Alternatives discussed:  No treatment Location:    Type:  Abscess   Location:  Upper extremity   Upper extremity location:  Arm   Arm location:  L lower arm Pre-procedure details:    Skin preparation:  Betadine Anesthesia (see MAR for exact dosages):    Anesthesia method:  Local infiltration   Local anesthetic:  Lidocaine 1% WITH epi Procedure type:    Complexity:  Simple Procedure details:    Incision types:  Cruciate   Scalpel blade:  11   Wound management:  Probed and deloculated   Drainage:  Purulent   Drainage amount:  Copious   Wound treatment:  Wound left open   Packing materials:  None Post-procedure details:    Patient tolerance of procedure:  Tolerated well, no immediate complications   (including critical care time)  Medications Ordered in ED Medications  lidocaine (PF) (XYLOCAINE) 1 % injection 5 mL (has no administration in time range)  sulfamethoxazole-trimethoprim (BACTRIM DS,SEPTRA DS) 800-160 MG per tablet 1 tablet (1 tablet Oral Given 06/19/18 1339)  ibuprofen (ADVIL,MOTRIN) tablet 600 mg (600 mg Oral Given 06/19/18 1338)     Initial  Impression / Assessment and Plan / ED Course  I have reviewed the triage vital signs and the nursing notes.  Pertinent labs & imaging results that were available during my care of the patient were reviewed by me and considered in my medical decision making (see chart for details).     Pt tolerated I&d well.  She will be d/c home with bactrim.  She knows to return  if redness increases or if sx worsen.  Final Clinical Impressions(s) / ED Diagnoses   Final diagnoses:  Abscess  Cellulitis of left forearm    ED Discharge Orders         Ordered    sulfamethoxazole-trimethoprim (BACTRIM DS,SEPTRA DS) 800-160 MG tablet  2 times daily     06/19/18 1408    ibuprofen (ADVIL,MOTRIN) 600 MG tablet  Every 6 hours PRN     06/19/18 1408           Jacalyn Lefevre, MD 06/19/18 1411

## 2018-06-19 NOTE — ED Notes (Signed)
Discharge instructions reviewed with patient. Patient verbalizes understanding. VSS.  Dressing placed to incision. Patient educated on dressing changes.

## 2020-02-20 ENCOUNTER — Ambulatory Visit: Payer: Self-pay | Admitting: Physician Assistant

## 2020-02-20 ENCOUNTER — Encounter: Payer: Self-pay | Admitting: Pediatric Intensive Care

## 2020-02-20 ENCOUNTER — Other Ambulatory Visit: Payer: Self-pay

## 2020-02-20 VITALS — BP 136/95 | HR 84 | Temp 98.4°F | Resp 18 | Ht 63.0 in | Wt 136.0 lb

## 2020-02-20 DIAGNOSIS — E039 Hypothyroidism, unspecified: Secondary | ICD-10-CM

## 2020-02-20 DIAGNOSIS — L03116 Cellulitis of left lower limb: Secondary | ICD-10-CM

## 2020-02-20 MED ORDER — CEPHALEXIN 500 MG PO CAPS: 500.0000 mg | ORAL_CAPSULE | Freq: Four times a day (QID) | ORAL | 0 refills | Status: AC

## 2020-02-20 MED ORDER — IBUPROFEN 600 MG PO TABS: 600.0000 mg | ORAL_TABLET | Freq: Four times a day (QID) | ORAL | 0 refills | Status: DC | PRN

## 2020-02-20 MED FILL — IBUPROFEN 600 MG TABLET: 600 | 7 days supply | Qty: 30 | Fill #0

## 2020-02-20 MED FILL — CEPHALEXIN 500 MG CAPSULE: 500 | 7 days supply | Qty: 28 | Fill #0

## 2020-02-20 NOTE — Congregational Nurse Program (Signed)
  Dept: [phone]   Congregational Nurse Program Note  Date of Encounter: 02/20/2020  Past Medical History: Past Medical History:  Diagnosis Date  . Anxiety and depression   . Hypothyroid   . Migraine   . MVP (mitral valve prolapse)   . Opioid abuse, in remission (HCC)   . Polysubstance abuse (HCC)     Encounter Details: Inial encounter for SSP and abscess evaluation. Has history of hypothyroid but currently on no medication. Does not have a PCP. Client missed while injecting Ice into left thigh and has a large (10cm x 15cm) erythemic area mid thigh. She states it is very painful. She does not endorse fever but states she doesn't feel well. Client will drive to [facility] mobile clinic for evaluation. CN can meet with client tomorrow if any medication is prescribed. Shann Medal RN BS CNP [phone]

## 2020-02-20 NOTE — Patient Instructions (Signed)
For your cellulitis, you will take Keflex 4 times a day for 7 days, ibuprofen 600 every 6 hours as needed for pain.  Please return to the mobile unit next week for a recheck of your cellulitis, please bring your medication bottle for your thyroid meds so we can refill those for you at this time.  I hope that you feel better soon  Roney Jaffe, PA-C Physician Assistant [facility] [email or web address]   Cellulitis, Adult  Cellulitis is a skin infection. The infected area is usually warm, red, swollen, and tender. This condition occurs most often in the arms and lower legs. The infection can travel to the muscles, blood, and underlying tissue and become serious. It is very important to get treated for this condition. What are the causes? Cellulitis is caused by bacteria. The bacteria enter through a break in the skin, such as a cut, burn, insect bite, open sore, or crack. What increases the risk? This condition is more likely to occur in people who:  Have a weak body defense system (immune system).  Have open wounds on the skin, such as cuts, burns, bites, and scrapes. Bacteria can enter the body through these open wounds.  Are older than 41 years of age.  Have diabetes.  Have a type of long-lasting (chronic) liver disease (cirrhosis) or kidney disease.  Are obese.  Have a skin condition such as: ? Itchy rash (eczema). ? Slow movement of blood in the veins (venous stasis). ? Fluid buildup below the skin (edema).  Have had radiation therapy.  Use IV drugs. What are the signs or symptoms? Symptoms of this condition include:  Redness, streaking, or spotting on the skin.  Swollen area of the skin.  Tenderness or pain when an area of the skin is touched.  Warm skin.  A fever.  Chills.  Blisters. How is this diagnosed? This condition is diagnosed based on a medical history and physical exam. You may also have  tests, including:  Blood tests.  Imaging tests. How is this treated? Treatment for this condition may include:  Medicines, such as antibiotic medicines or medicines to treat allergies (antihistamines).  Supportive care, such as rest and application of cold or warm cloths (compresses) to the skin.  Hospital care, if the condition is severe. The infection usually starts to get better within 1-2 days of treatment. Follow these instructions at home:  Medicines  Take over-the-counter and prescription medicines only as told by your health care provider.  If you were prescribed an antibiotic medicine, take it as told by your health care provider. Do not stop taking the antibiotic even if you start to feel better. General instructions  Drink enough fluid to keep your urine pale yellow.  Do not touch or rub the infected area.  Raise (elevate) the infected area above the level of your heart while you are sitting or lying down.  Apply warm or cold compresses to the affected area as told by your health care provider.  Keep all follow-up visits as told by your health care provider. This is important. These visits let your health care provider make sure a more serious infection is not developing. Contact a health care provider if:  You have a fever.  Your symptoms do not begin to improve within 1-2 days of starting treatment.  Your bone or joint underneath the infected area becomes painful after the skin has healed.  Your infection returns in the same area or another area.  You notice  a swollen bump in the infected area.  You develop new symptoms.  You have a general ill feeling (malaise) with muscle aches and pains. Get help right away if:  Your symptoms get worse.  You feel very sleepy.  You develop vomiting or diarrhea that persists.  You notice red streaks coming from the infected area.  Your red area gets larger or turns dark in color. These symptoms may represent a  serious problem that is an emergency. Do not wait to see if the symptoms will go away. Get medical help right away. Call your local emergency services (911 in the [place] Do not drive yourself to the hospital. Summary  Cellulitis is a skin infection. This condition occurs most often in the arms and lower legs.  Treatment for this condition may include medicines, such as antibiotic medicines or antihistamines.  Take over-the-counter and prescription medicines only as told by your health care provider. If you were prescribed an antibiotic medicine, do not stop taking the antibiotic even if you start to feel better.  Contact a health care provider if your symptoms do not begin to improve within 1-2 days of starting treatment or your symptoms get worse.  Keep all follow-up visits as told by your health care provider. This is important. These visits let your health care provider make sure that a more serious infection is not developing. This information is not intended to replace advice given to you by your health care provider. Make sure you discuss any questions you have with your health care provider. Document Revised: 11/30/2017 Document Reviewed: 11/30/2017 Elsevier Patient Education  2020 [employer].

## 2020-02-20 NOTE — Progress Notes (Signed)
New Patient Office Visit  Subjective:  Patient ID: Terri Oneal, female    DOB: [birth year]  Age: 39 y.o. MRN: [id number]  CC:  Chief Complaint  Patient presents with  . Abscess    HPI AERIS HERSMAN reports that she was injecting herself in her thigh and missed, states that she started having pain and tenderness in the area 3 days ago, has increased in size.  Has been using ibuprofen and Excedrin without relief.  Denies fever chills increased heart rate  Reports history of hypothyroidism, states that she has been off her medication for the last couple of months, reports that she has medication at home.  Reports being treated for hypothyroidism since she was 18.   Past Medical History:  Diagnosis Date  . Anxiety and depression   . Hypothyroid   . Migraine   . MVP (mitral valve prolapse)   . Opioid abuse, in remission (HCC)   . Polysubstance abuse [facility])     Past Surgical History:  Procedure Laterality Date  . KNEE ARTHROPLASTY    . KNEE SURGERY    . MOUTH SURGERY      Family History  Problem Relation Age of Onset  . Diabetes Other   . Hypertension Other   . Thyroid disease Other     Social History   Socioeconomic History  . Marital status: Single    Spouse name: Not on file  . Number of children: Not on file  . Years of education: Not on file  . Highest education level: Not on file  Occupational History  . Not on file  Tobacco Use  . Smoking status: Current Every Day Smoker    Packs/day: 1.00    Types: Cigarettes  . Smokeless tobacco: Never Used  Substance and Sexual Activity  . Alcohol use: No  . Drug use: Yes    Types: Heroin    Comment: former opiates. Last used heroin on 03/28/2017  . Sexual activity: Not on file  Other Topics Concern  . Not on file  Social History Narrative  . Not on file   Social Determinants of Health   Financial Resource Strain:   . Difficulty of Paying Living Expenses:   Food Insecurity:   . Worried About Patent examiner in the Last Year:   . Barista in the Last Year:   Transportation Needs:   . Freight forwarder (Medical):   Marland Kitchen Lack of Transportation (Non-Medical):   Physical Activity:   . Days of Exercise per Week:   . Minutes of Exercise per Session:   Stress:   . Feeling of Stress :   Social Connections:   . Frequency of Communication with Friends and Family:   . Frequency of Social Gatherings with Friends and Family:   . Attends Religious Services:   . Active Member of Clubs or Organizations:   . Attends Banker Meetings:   Marland Kitchen Marital Status:   Intimate Partner Violence:   . Fear of Current or Ex-Partner:   . Emotionally Abused:   Marland Kitchen Physically Abused:   . Sexually Abused:     ROS Review of Systems  Constitutional: Negative for chills, fatigue and fever.  HENT: Negative.   Eyes: Negative.   Respiratory: Negative.   Cardiovascular: Negative.   Gastrointestinal: Negative for nausea and vomiting.  Endocrine: Negative.   Genitourinary: Negative.   Musculoskeletal: Negative.   Skin: Positive for wound.  Allergic/Immunologic: Negative.   Neurological: Negative.  Hematological: Negative.   Psychiatric/Behavioral: Negative.     Objective:   Today's Vitals: BP (!) 136/95 (BP Location: Left Arm, Patient Position: Sitting, Cuff Size: Normal)   Pulse 84   Temp 98.4 F (36.9 C) (Oral)   Resp 18   Ht 5\' 3"  (1.6 m)   Wt 136 lb (61.7 kg)   SpO2 100%   BMI 24.09 kg/m   Physical Exam Constitutional:      General: She is not in acute distress.    Appearance: Normal appearance. She is not ill-appearing.  HENT:     Head: Normocephalic and atraumatic.     Right Ear: External ear normal.     Left Ear: External ear normal.     Nose: Nose normal.     Mouth/Throat:     Mouth: Mucous membranes are moist.     Pharynx: Oropharynx is clear.  Eyes:     Extraocular Movements: Extraocular movements intact.     Conjunctiva/sclera: Conjunctivae normal.      Pupils: Pupils are equal, round, and reactive to light.  Cardiovascular:     Rate and Rhythm: Normal rate and regular rhythm.     Pulses: Normal pulses.     Heart sounds: Normal heart sounds.  Pulmonary:     Effort: Pulmonary effort is normal.     Breath sounds: Normal breath sounds.  Abdominal:     General: Abdomen is flat. Bowel sounds are normal.     Palpations: Abdomen is soft.  Musculoskeletal:        General: Normal range of motion.     Cervical back: Normal range of motion and neck supple.  Skin:    General: Skin is warm and moist.       Neurological:     General: No focal deficit present.     Mental Status: She is alert and oriented to person, place, and time.  Psychiatric:        Mood and Affect: Mood normal.        Behavior: Behavior normal.        Thought Content: Thought content normal.        Judgment: Judgment normal.     Assessment & Plan:   Problem List Items Addressed This Visit      Endocrine   Hypothyroid    Other Visit Diagnoses    Cellulitis of leg, left    -  Primary   Relevant Medications   cephALEXin (KEFLEX) 500 MG capsule   ibuprofen (ADVIL) 600 MG tablet      Outpatient Encounter Medications as of 02/20/2020  Medication Sig  . ibuprofen (ADVIL) 600 MG tablet Take 1 tablet (600 mg total) by mouth every 6 (six) hours as needed.  . [DISCONTINUED] ibuprofen (ADVIL,MOTRIN) 600 MG tablet Take 1 tablet (600 mg total) by mouth every 6 (six) hours as needed.  02/22/2020 acetaminophen (TYLENOL) 325 MG tablet Take 975 mg by mouth every 6 (six) hours as needed for mild pain or headache.  . cephALEXin (KEFLEX) 500 MG capsule Take 1 capsule (500 mg total) by mouth 4 (four) times daily for 7 days.  Marland Kitchen levothyroxine (LEVOXYL) 75 MCG tablet Take 75 mcg by mouth daily before breakfast. (Patient not taking: Reported on 02/20/2020)  . [DISCONTINUED] cephALEXin (KEFLEX) 500 MG capsule Take 1 capsule (500 mg total) by mouth 4 (four) times daily. (Patient not taking:  Reported on 06/19/2018)   No facility-administered encounter medications on file as of 02/20/2020.  1. Cellulitis of leg, left Patient  education given on red flags for prompt evaluation at emergency department.  Congregational nurse will pick up medication and bring to patient. Follow-up in mobile unit in 1 week to reevaluate wound - cephALEXin (KEFLEX) 500 MG capsule; Take 1 capsule (500 mg total) by mouth 4 (four) times daily for 7 days.  Dispense: 28 capsule; Refill: 0 - ibuprofen (ADVIL) 600 MG tablet; Take 1 tablet (600 mg total) by mouth every 6 (six) hours as needed.  Dispense: 30 tablet; Refill: 0  2. Hypothyroidism, unspecified type Encourage patient to restart medication, bring medication bottles to next office visit.  Patient given appointment for financial assistance, appointment to establish primary care with Dr. Earlene Plater on May 05, 2020.  Patient working with Engineer, water for medication assisted treatment.   I have reviewed the patient's medical history (PMH, PSH, Social History, Family History, Medications, and allergies) , and have been updated if relevant. I spent 30 minutes reviewing chart and  face to face time with patient.       Follow-up: Return in about 1 week (around 02/27/2020).   Kasandra Knudsen Mayers, PA-C

## 2020-02-27 ENCOUNTER — Ambulatory Visit: Payer: Medicaid Other

## 2020-04-11 ENCOUNTER — Ambulatory Visit: Payer: Self-pay | Admitting: Nurse Practitioner

## 2020-04-11 ENCOUNTER — Other Ambulatory Visit: Payer: Self-pay

## 2020-04-11 VITALS — BP 101/72 | HR 120 | Resp 16

## 2020-04-11 DIAGNOSIS — L089 Local infection of the skin and subcutaneous tissue, unspecified: Secondary | ICD-10-CM

## 2020-04-11 MED ORDER — SULFAMETHOXAZOLE-TRIMETHOPRIM 800-160 MG PO TABS: 1.0000 | ORAL_TABLET | Freq: Two times a day (BID) | ORAL | 0 refills | Status: AC

## 2020-04-11 NOTE — Progress Notes (Signed)
Assessment & Plan:  Terri Oneal was seen today for cyst.  Diagnoses and all orders for this visit:  Skin infection -     sulfamethoxazole-trimethoprim (BACTRIM DS) 800-160 MG tablet; Take 1 tablet by mouth 2 (two) times daily for 5 days.    Patient has been counseled on age-appropriate routine health concerns for screening and prevention. These are reviewed and up-to-date. Referrals have been placed accordingly. Immunizations are up-to-date or declined.    Subjective:   Chief Complaint  Patient presents with  . Cyst    inner thigh b/l   HPI Terri Oneal 39 y.o. female presents to mobile clinic today with complaints of cysts between inner thighs. She has inadvertently been injecting heroin subcutaneously at times. She was treated for this same issue with keflex on 02-20-2020 and states the area has improved however there is mild tenderness to palpation. I am unable to express any purulent fluid from the area. Last use of heroin was yesterday. HR is elevated today. She is not taking her levothyroxine as prescribed.       Review of Systems  Constitutional: Negative for fever, malaise/fatigue and weight loss.  HENT: Negative.  Negative for nosebleeds.   Eyes: Negative.  Negative for blurred vision, double vision and photophobia.  Respiratory: Negative.  Negative for cough and shortness of breath.   Cardiovascular: Negative.  Negative for chest pain, palpitations and leg swelling.  Gastrointestinal: Negative.  Negative for heartburn, nausea and vomiting.  Musculoskeletal: Negative.  Negative for myalgias.  Skin:       SEE HPI  Neurological: Negative.  Negative for dizziness, focal weakness, seizures and headaches.  Psychiatric/Behavioral: Negative.  Negative for suicidal ideas.    Past Medical History:  Diagnosis Date  . Anxiety and depression   . Hypothyroid   . Migraine   . MVP (mitral valve prolapse)   . Opioid abuse, in remission (HCC)   . Polysubstance abuse [facility])      Past Surgical History:  Procedure Laterality Date  . KNEE ARTHROPLASTY    . KNEE SURGERY    . MOUTH SURGERY      Family History  Problem Relation Age of Onset  . Diabetes Other   . Hypertension Other   . Thyroid disease Other     Social History Reviewed with no changes to be made today.   Outpatient Medications Prior to Visit  Medication Sig Dispense Refill  . acetaminophen (TYLENOL) 325 MG tablet Take 975 mg by mouth every 6 (six) hours as needed for mild pain or headache. (Patient not taking: Reported on 04/11/2020)    . ibuprofen (ADVIL) 600 MG tablet Take 1 tablet (600 mg total) by mouth every 6 (six) hours as needed. (Patient not taking: Reported on 04/11/2020) 30 tablet 0  . levothyroxine (LEVOXYL) 75 MCG tablet Take 75 mcg by mouth daily before breakfast. (Patient not taking: Reported on 02/20/2020)     No facility-administered medications prior to visit.    No Known Allergies     Objective:    BP 101/72   Pulse (!) 120   Resp 16   SpO2 97%  Wt Readings from Last 3 Encounters:  02/20/20 136 lb (61.7 kg)  06/19/18 120 lb (54.4 kg)  08/28/17 126 lb 12.2 oz (57.5 kg)    Physical Exam Vitals and nursing note reviewed.  Constitutional:      Appearance: She is well-developed.  HENT:     Head: Normocephalic and atraumatic.  Cardiovascular:     Rate  and Rhythm: Regular rhythm. Tachycardia present.     Heart sounds: Normal heart sounds. No murmur heard.  No friction rub. No gallop.   Pulmonary:     Effort: Pulmonary effort is normal. No tachypnea or respiratory distress.     Breath sounds: Normal breath sounds. No decreased breath sounds, wheezing, rhonchi or rales.  Chest:     Chest wall: No tenderness.  Abdominal:     General: Bowel sounds are normal.     Palpations: Abdomen is soft.  Musculoskeletal:        General: Normal range of motion.     Cervical back: Normal range of motion.  Skin:    General: Skin is warm and dry.  Neurological:     Mental  Status: She is alert and oriented to person, place, and time.     Coordination: Coordination normal.  Psychiatric:        Behavior: Behavior normal. Behavior is cooperative.        Thought Content: Thought content normal.        Judgment: Judgment normal.          Patient has been counseled extensively about nutrition and exercise as well as the importance of adherence with medications and regular follow-up. The patient was given clear instructions to go to ER or return to medical center if symptoms don't improve, worsen or new problems develop. The patient verbalized understanding.   Follow-up: Return for establish PCP.   Claiborne Rigg, FNP-BC [facility] [place], [place] [phone]   04/11/2020, 12:21 PM

## 2020-04-11 NOTE — Patient Instructions (Signed)
Substance Use Disorder Substance use disorder occurs when a person's repeated use of drugs or alcohol interferes with his or her ability to be productive. This disorder can cause problems with mental and physical health. It can affect your ability to have healthy relationships, and it can keep you from being able to meet your responsibilities at work, home, or school. It can also lead to addiction, which is a condition in which the person cannot stop using the substance consistently for a period of time. Addiction changes the way the brain works. Because of these changes, addiction is a chronic condition. Substance use disorder can be mild, moderate, or severe. The most commonly abused substances include:  Alcohol.  Tobacco.  Marijuana.  Stimulants, such as cocaine and methamphetamine.  Hallucinogens, such as LSD and PCP.  Opioids, such as some prescription pain medicines and heroin. What are the causes? This condition may develop due to many complex social, psychological, or physical reasons, such as:  Stress.  Abuse.  Peer pressure.  Anxiety or depression. What increases the risk? This condition is more likely to develop in people who:  Use substances to cope with stress.  Have been abused.  Have a mental health disorder, such as depression.  Have a family history of substance use disorder. What are the signs or symptoms? Symptoms of this condition include:  Using the substance for longer periods of time or at a higher dosage than what is normal or intended.  Having a lasting desire to use the substance.  Being unable to slow down or stop the use of the substance.  Spending an abnormal amount of time getting the substance, using the substance, or recovering from using the substance.  Using the substance in a way that interferes with work, school, social activities, and personal relationships.  Using the substance even after having negative consequences, such  as: ? Health problems. ? Legal or financial troubles. ? Job loss. ? Relationship problems.  Needing more and more of the substance to get the same effect (developing tolerance).  Experiencing unpleasant symptoms if you do not use the substance (withdrawal).  Using the substance to avoid withdrawal symptoms. How is this diagnosed? This condition may be diagnosed based on:  A physical exam.  Your history of substance use.  Your symptoms. This includes: ? How substance use affects your life. ? Changes in personality, behaviors, and mood. ? Having at least two symptoms of substance use disorder within a 12-month period. ? Health issues related to substance use, such as liver damage, shortness of breath, fatigue, cough, or heart problems.  Blood or urine tests to screen for alcohol and drugs. How is this treated? This condition may be treated by:  Stopping substance use safely. This may require taking medicines and being closely monitored for several days.  Taking part in group and individual counseling from mental health providers who help people with substance use disorder.  Staying at a live-in (residential) treatment center for several days or weeks.  Attending daily counseling sessions at a treatment center.  Taking medicine as told by your health care provider: ? To ease symptoms and prevent complications during withdrawal. ? To treat other mental health issues, such as depression or anxiety. ? To block cravings by causing the same effects as the substance. ? To block the effects of the substance or replace good sensations with unpleasant ones.  Participating in a support group to share your experience with others who are going through the same thing. These   groups are an important part of long-term recovery for many people. Recovery can be a long process. Many people who undergo treatment start using the substance again after stopping (relapse). If you relapse, that does  not mean that treatment will not work. Follow these instructions at home:   Take over-the-counter and prescription medicines only as told by your health care provider.  Do not use any drugs or alcohol.  Avoid temptations or triggers that you associate with your use of the substance.  Learn and practice techniques for managing stress.  Have a plan for vulnerable moments. Get phone numbers of people who are willing to help and who are committed to your recovery.  Attend support groups on a regular basis. These groups include 12-step programs like Alcoholics Anonymous and Narcotics Anonymous.  Keep all follow-up visits as told by your health care providers. This is important. This includes continuing to work with therapists and support groups. Contact a health care provider if:  You cannot take your medicines as told.  Your symptoms get worse.  You have trouble resisting the urge to use drugs or alcohol. Get help right away if you:  Relapse.  Think that you may have taken too much of a drug. The hotline of the National Poison Control Center is (800) 222-1222.  Have signs of an overdose. Symptoms include: ? Chest pain. ? Confusion. ? Sleepiness or difficulty staying awake. ? Slowed breathing. ? Nausea or vomiting. ? A seizure.  Have serious thoughts about hurting yourself or someone else. Drug overdose is an emergency. Do not wait to see if the symptoms will go away. Get medical help right away. Call your local emergency services (911 in the [place] Do not drive yourself to the hospital. If you ever feel like you may hurt yourself or others, or have thoughts about taking your own life, get help right away. You can go to your nearest emergency department or call:  Your local emergency services (911 in the [place]  A suicide crisis helpline, such as the National Suicide Prevention Lifeline at 1-800-273-8255. This is open 24 hours a day. Summary  Substance use disorder occurs  when a person's repeated use of drugs or alcohol interferes with his or her ability to be productive.  Taking part in group and individual counseling from mental health providers is a common treatment for people with substance use disorder.  Recovery can be a long process. Many people who undergo treatment start using the substance again after stopping (relapse). A relapse does not mean that treatment will not work.  Attend support groups such as Alcoholics Anonymous and Narcotics Anonymous. These groups are an important part of long-term recovery for many people. This information is not intended to replace advice given to you by your health care provider. Make sure you discuss any questions you have with your health care provider. Document Revised: 11/01/2018 Document Reviewed: 08/22/2017 Elsevier Patient Education  2020 [employer] Inc.  

## 2020-04-15 ENCOUNTER — Telehealth: Payer: Self-pay | Admitting: Pediatric Intensive Care

## 2020-04-15 NOTE — Telephone Encounter (Signed)
Received call from client- she states that she has an abscess on her thigh and that she is worried that it is not healing. She states she thinks she has been running a fever. Client asks for information regarding the [facility] mobile clinic location and hours. CN advised client of mobile clinic schedule and location for today and Saturday. Shann Medal RN BSN CNP [phone]

## 2020-05-05 ENCOUNTER — Telehealth: Payer: Self-pay | Admitting: Internal Medicine

## 2020-07-07 ENCOUNTER — Inpatient Hospital Stay (HOSPITAL_COMMUNITY)
Admission: EM | Admit: 2020-07-07 | Discharge: 2020-07-25 | DRG: 023 | Disposition: E | Payer: Self-pay | Attending: Pulmonary Disease | Admitting: Pulmonary Disease

## 2020-07-07 ENCOUNTER — Emergency Department (HOSPITAL_COMMUNITY): Payer: Self-pay

## 2020-07-07 ENCOUNTER — Other Ambulatory Visit: Payer: Self-pay

## 2020-07-07 ENCOUNTER — Encounter (HOSPITAL_COMMUNITY): Payer: Self-pay | Admitting: Emergency Medicine

## 2020-07-07 DIAGNOSIS — D599 Acquired hemolytic anemia, unspecified: Secondary | ICD-10-CM | POA: Diagnosis not present

## 2020-07-07 DIAGNOSIS — Z8679 Personal history of other diseases of the circulatory system: Secondary | ICD-10-CM

## 2020-07-07 DIAGNOSIS — R414 Neurologic neglect syndrome: Secondary | ICD-10-CM | POA: Diagnosis present

## 2020-07-07 DIAGNOSIS — G935 Compression of brain: Secondary | ICD-10-CM | POA: Diagnosis present

## 2020-07-07 DIAGNOSIS — Z0189 Encounter for other specified special examinations: Secondary | ICD-10-CM

## 2020-07-07 DIAGNOSIS — R1310 Dysphagia, unspecified: Secondary | ICD-10-CM | POA: Diagnosis present

## 2020-07-07 DIAGNOSIS — J9601 Acute respiratory failure with hypoxia: Secondary | ICD-10-CM | POA: Diagnosis not present

## 2020-07-07 DIAGNOSIS — R198 Other specified symptoms and signs involving the digestive system and abdomen: Secondary | ICD-10-CM

## 2020-07-07 DIAGNOSIS — I330 Acute and subacute infective endocarditis: Secondary | ICD-10-CM

## 2020-07-07 DIAGNOSIS — Z79890 Hormone replacement therapy (postmenopausal): Secondary | ICD-10-CM

## 2020-07-07 DIAGNOSIS — R6510 Systemic inflammatory response syndrome (SIRS) of non-infectious origin without acute organ dysfunction: Secondary | ICD-10-CM

## 2020-07-07 DIAGNOSIS — R29717 NIHSS score 17: Secondary | ICD-10-CM | POA: Diagnosis present

## 2020-07-07 DIAGNOSIS — N170 Acute kidney failure with tubular necrosis: Secondary | ICD-10-CM | POA: Diagnosis not present

## 2020-07-07 DIAGNOSIS — Z452 Encounter for adjustment and management of vascular access device: Secondary | ICD-10-CM

## 2020-07-07 DIAGNOSIS — F17210 Nicotine dependence, cigarettes, uncomplicated: Secondary | ICD-10-CM | POA: Diagnosis present

## 2020-07-07 DIAGNOSIS — A4181 Sepsis due to Enterococcus: Secondary | ICD-10-CM | POA: Diagnosis present

## 2020-07-07 DIAGNOSIS — I63511 Cerebral infarction due to unspecified occlusion or stenosis of right middle cerebral artery: Principal | ICD-10-CM

## 2020-07-07 DIAGNOSIS — J9690 Respiratory failure, unspecified, unspecified whether with hypoxia or hypercapnia: Secondary | ICD-10-CM

## 2020-07-07 DIAGNOSIS — K559 Vascular disorder of intestine, unspecified: Secondary | ICD-10-CM | POA: Diagnosis not present

## 2020-07-07 DIAGNOSIS — B1920 Unspecified viral hepatitis C without hepatic coma: Secondary | ICD-10-CM | POA: Diagnosis present

## 2020-07-07 DIAGNOSIS — D65 Disseminated intravascular coagulation [defibrination syndrome]: Secondary | ICD-10-CM | POA: Diagnosis not present

## 2020-07-07 DIAGNOSIS — Z20822 Contact with and (suspected) exposure to covid-19: Secondary | ICD-10-CM | POA: Diagnosis present

## 2020-07-07 DIAGNOSIS — I358 Other nonrheumatic aortic valve disorders: Secondary | ICD-10-CM

## 2020-07-07 DIAGNOSIS — E039 Hypothyroidism, unspecified: Secondary | ICD-10-CM | POA: Diagnosis present

## 2020-07-07 DIAGNOSIS — E162 Hypoglycemia, unspecified: Secondary | ICD-10-CM | POA: Diagnosis not present

## 2020-07-07 DIAGNOSIS — D62 Acute posthemorrhagic anemia: Secondary | ICD-10-CM | POA: Diagnosis not present

## 2020-07-07 DIAGNOSIS — Z515 Encounter for palliative care: Secondary | ICD-10-CM

## 2020-07-07 DIAGNOSIS — Z833 Family history of diabetes mellitus: Secondary | ICD-10-CM

## 2020-07-07 DIAGNOSIS — F1121 Opioid dependence, in remission: Secondary | ICD-10-CM | POA: Diagnosis present

## 2020-07-07 DIAGNOSIS — R6521 Severe sepsis with septic shock: Secondary | ICD-10-CM | POA: Diagnosis present

## 2020-07-07 DIAGNOSIS — I70229 Atherosclerosis of native arteries of extremities with rest pain, unspecified extremity: Secondary | ICD-10-CM | POA: Diagnosis not present

## 2020-07-07 DIAGNOSIS — R29810 Facial weakness: Secondary | ICD-10-CM | POA: Diagnosis present

## 2020-07-07 DIAGNOSIS — I76 Septic arterial embolism: Secondary | ICD-10-CM

## 2020-07-07 DIAGNOSIS — G936 Cerebral edema: Secondary | ICD-10-CM | POA: Diagnosis present

## 2020-07-07 DIAGNOSIS — I081 Rheumatic disorders of both mitral and tricuspid valves: Secondary | ICD-10-CM | POA: Diagnosis present

## 2020-07-07 DIAGNOSIS — Z96659 Presence of unspecified artificial knee joint: Secondary | ICD-10-CM | POA: Diagnosis present

## 2020-07-07 DIAGNOSIS — J9600 Acute respiratory failure, unspecified whether with hypoxia or hypercapnia: Secondary | ICD-10-CM

## 2020-07-07 DIAGNOSIS — K661 Hemoperitoneum: Secondary | ICD-10-CM | POA: Diagnosis not present

## 2020-07-07 DIAGNOSIS — G8104 Flaccid hemiplegia affecting left nondominant side: Secondary | ICD-10-CM | POA: Diagnosis present

## 2020-07-07 DIAGNOSIS — Z8249 Family history of ischemic heart disease and other diseases of the circulatory system: Secondary | ICD-10-CM

## 2020-07-07 DIAGNOSIS — F1990 Other psychoactive substance use, unspecified, uncomplicated: Secondary | ICD-10-CM

## 2020-07-07 DIAGNOSIS — Z66 Do not resuscitate: Secondary | ICD-10-CM | POA: Diagnosis not present

## 2020-07-07 DIAGNOSIS — D735 Infarction of spleen: Secondary | ICD-10-CM

## 2020-07-07 DIAGNOSIS — G9341 Metabolic encephalopathy: Secondary | ICD-10-CM | POA: Diagnosis present

## 2020-07-07 LAB — COMPREHENSIVE METABOLIC PANEL
ALT: 18 U/L (ref 0–44)
AST: 42 U/L — ABNORMAL HIGH (ref 15–41)
Albumin: 1.9 g/dL — ABNORMAL LOW (ref 3.5–5.0)
Alkaline Phosphatase: 157 U/L — ABNORMAL HIGH (ref 38–126)
Anion gap: 13 (ref 5–15)
BUN: 20 mg/dL (ref 6–20)
CO2: 20 mmol/L — ABNORMAL LOW (ref 22–32)
Calcium: 7.9 mg/dL — ABNORMAL LOW (ref 8.9–10.3)
Chloride: 99 mmol/L (ref 98–111)
Creatinine, Ser: 0.97 mg/dL (ref 0.44–1.00)
GFR, Estimated: >60 mL/min (ref >60)
Glucose, Bld: 111 mg/dL — ABNORMAL HIGH (ref 70–99)
Potassium: 4.1 mmol/L (ref 3.5–5.1)
Sodium: 132 mmol/L — ABNORMAL LOW (ref 135–145)
Total Bilirubin: 3.5 mg/dL — ABNORMAL HIGH (ref 0.3–1.2)
Total Protein: 6.2 g/dL — ABNORMAL LOW (ref 6.5–8.1)

## 2020-07-07 LAB — RESP PANEL BY RT-PCR (FLU A&B, COVID) ARPGX2
Influenza A by PCR: NEGATIVE
Influenza B by PCR: NEGATIVE
SARS Coronavirus 2 by RT PCR: NEGATIVE

## 2020-07-07 LAB — CBC
HCT: 26.9 % — ABNORMAL LOW (ref 36.0–46.0)
Hemoglobin: 8 g/dL — ABNORMAL LOW (ref 12.0–15.0)
MCH: 28.4 pg (ref 26.0–34.0)
MCHC: 29.7 g/dL — ABNORMAL LOW (ref 30.0–36.0)
MCV: 95.4 fL (ref 80.0–100.0)
Platelets: 103 10*3/uL — ABNORMAL LOW (ref 150–400)
RBC: 2.82 MIL/uL — ABNORMAL LOW (ref 3.87–5.11)
RDW: 22.1 % — ABNORMAL HIGH (ref 11.5–15.5)
WBC: 19.9 10*3/uL — ABNORMAL HIGH (ref 4.0–10.5)
nRBC: 0.2 % (ref 0.0–0.2)

## 2020-07-07 LAB — DIFFERENTIAL
Abs Immature Granulocytes: 0.32 10*3/uL — ABNORMAL HIGH (ref 0.00–0.07)
Basophils Absolute: 0 10*3/uL (ref 0.0–0.1)
Basophils Relative: 0 %
Eosinophils Absolute: 0.1 10*3/uL (ref 0.0–0.5)
Eosinophils Relative: 0 %
Immature Granulocytes: 2 %
Lymphocytes Relative: 7 %
Lymphs Abs: 1.4 10*3/uL (ref 0.7–4.0)
Monocytes Absolute: 0.4 10*3/uL (ref 0.1–1.0)
Monocytes Relative: 2 %
Neutro Abs: 17.7 10*3/uL — ABNORMAL HIGH (ref 1.7–7.7)
Neutrophils Relative %: 89 %

## 2020-07-07 LAB — I-STAT CHEM 8, ED
BUN: 22 mg/dL — ABNORMAL HIGH (ref 6–20)
Calcium, Ion: 1.07 mmol/L — ABNORMAL LOW (ref 1.15–1.40)
Chloride: 100 mmol/L (ref 98–111)
Creatinine, Ser: 0.8 mg/dL (ref 0.44–1.00)
Glucose, Bld: 108 mg/dL — ABNORMAL HIGH (ref 70–99)
HCT: 24 % — ABNORMAL LOW (ref 36.0–46.0)
Hemoglobin: 8.2 g/dL — ABNORMAL LOW (ref 12.0–15.0)
Potassium: 4.1 mmol/L (ref 3.5–5.1)
Sodium: 132 mmol/L — ABNORMAL LOW (ref 135–145)
TCO2: 23 mmol/L (ref 22–32)

## 2020-07-07 LAB — PROTIME-INR
INR: 1.4 — ABNORMAL HIGH (ref 0.8–1.2)
Prothrombin Time: 16.5 seconds — ABNORMAL HIGH (ref 11.4–15.2)

## 2020-07-07 LAB — ETHANOL: Alcohol, Ethyl (B): <10 mg/dL (ref <10)

## 2020-07-07 LAB — I-STAT BETA HCG BLOOD, ED (MC, WL, AP ONLY): I-stat hCG, quantitative: <5 m[IU]/mL (ref <5)

## 2020-07-07 LAB — LACTIC ACID, PLASMA: Lactic Acid, Venous: 3.2 mmol/L (ref 0.5–1.9)

## 2020-07-07 LAB — APTT: aPTT: 37 seconds — ABNORMAL HIGH (ref 24–36)

## 2020-07-07 LAB — SODIUM: Sodium: 130 mmol/L — ABNORMAL LOW (ref 135–145)

## 2020-07-07 MED ORDER — LACTATED RINGERS IV SOLN: INTRAVENOUS | Status: DC

## 2020-07-07 MED ORDER — ASPIRIN 300 MG RE SUPP
300.0000 mg | Freq: Every day | RECTAL | Status: DC
  Administered 2020-07-07: 300 mg via RECTAL
  Filled 2020-07-07: qty 1

## 2020-07-07 MED ORDER — SODIUM CHLORIDE 0.9 % IV SOLN: 2.0000 g | Freq: Once | INTRAVENOUS | Status: DC

## 2020-07-07 MED ORDER — SODIUM CHLORIDE 0.9% FLUSH: 3.0000 mL | Freq: Once | INTRAVENOUS | Status: DC

## 2020-07-07 MED ORDER — LACTATED RINGERS IV BOLUS (SEPSIS)
500.0000 mL | Freq: Once | INTRAVENOUS | Status: AC
  Administered 2020-07-07: 500 mL via INTRAVENOUS

## 2020-07-07 MED ORDER — IOHEXOL 350 MG/ML SOLN
75.0000 mL | Freq: Once | INTRAVENOUS | Status: AC | PRN
  Administered 2020-07-07: 22:00:00 75 mL via INTRAVENOUS

## 2020-07-07 MED ORDER — SODIUM CHLORIDE 3 % IV SOLN
INTRAVENOUS | Status: DC
  Filled 2020-07-07 (×2): qty 500

## 2020-07-07 MED ORDER — VANCOMYCIN HCL IN DEXTROSE 1-5 GM/200ML-% IV SOLN
1000.0000 mg | Freq: Three times a day (TID) | INTRAVENOUS | Status: DC
  Administered 2020-07-08: 10:00:00 1000 mg via INTRAVENOUS
  Filled 2020-07-07: qty 200

## 2020-07-07 MED ORDER — ONDANSETRON HCL 4 MG/2ML IJ SOLN: 4.0000 mg | Freq: Four times a day (QID) | INTRAMUSCULAR | Status: DC | PRN

## 2020-07-07 MED ORDER — VANCOMYCIN HCL IN DEXTROSE 1-5 GM/200ML-% IV SOLN: 1000.0000 mg | Freq: Once | INTRAVENOUS | Status: DC

## 2020-07-07 MED ORDER — HEPARIN SODIUM (PORCINE) 5000 UNIT/ML IJ SOLN
5000.0000 [IU] | Freq: Three times a day (TID) | INTRAMUSCULAR | Status: DC
  Administered 2020-07-08 – 2020-07-10 (×6): 5000 [IU] via SUBCUTANEOUS
  Filled 2020-07-07 (×5): qty 1

## 2020-07-07 MED ORDER — PIPERACILLIN-TAZOBACTAM 3.375 G IVPB: 3.3750 g | Freq: Three times a day (TID) | INTRAVENOUS | Status: DC

## 2020-07-07 MED ORDER — METRONIDAZOLE IN NACL 5-0.79 MG/ML-% IV SOLN: 500.0000 mg | Freq: Once | INTRAVENOUS | Status: DC

## 2020-07-07 MED ORDER — PIPERACILLIN-TAZOBACTAM 3.375 G IVPB 30 MIN
3.3750 g | Freq: Once | INTRAVENOUS | Status: AC
  Administered 2020-07-07: 3.375 g via INTRAVENOUS
  Filled 2020-07-07: qty 50

## 2020-07-07 MED ORDER — IOHEXOL 350 MG/ML SOLN
50.0000 mL | Freq: Once | INTRAVENOUS | Status: AC | PRN
  Administered 2020-07-07: 23:00:00 50 mL via INTRAVENOUS

## 2020-07-07 MED ORDER — SODIUM CHLORIDE 0.9 % IV SOLN: INTRAVENOUS | Status: DC

## 2020-07-07 MED ORDER — VANCOMYCIN HCL 1250 MG/250ML IV SOLN
1250.0000 mg | INTRAVENOUS | Status: AC
  Administered 2020-07-08: 01:00:00 1250 mg via INTRAVENOUS
  Filled 2020-07-07: qty 250

## 2020-07-07 NOTE — Code Documentation (Signed)
Responded to Code Stroke called at 2110 for L facial droop, R sided gaze, and slurred speech, [id number]. Pt arrived at 2124, NIH-17, CT head-acute/subacute right MCA territory infarct, no intracranial hemorrhage, CTA- Occlusion of the proximal right M1 segment with limited collateralization within the anterior right MCA territory. Large right MCA territory infarct with previously reported ASPECTS of 0.. No TPA-pt outside window; pt not IR candidate. Plan for hypertonic saline and ICU admission.

## 2020-07-07 NOTE — ED Provider Notes (Signed)
Patient brought by EMS as a Code Stroke with L sided hemiparesis, L sided neglect and R gaze preference. LSN was 0900 today. Airway is intact. Taken directly to CT. Neuro team at bedside on patient's arrival.    Pollyann Savoy, MD 07/10/2020 2141

## 2020-07-07 NOTE — Consult Note (Addendum)
Referring Physician: Dr. Lynelle Doctor    Chief Complaint: Acute onset of left hemiplegia  HPI: Terri Oneal is an 39 y.o. female active IV heroin abuser with a history of anxiety, depression, hypothyroidism, migraines and mitral valve prolapse who is brought in by EMS as a Code Stroke for acute onset of left hemiplegia. [name] was 0900 when her boyfriend left their home. He came back at 3 PM to find her down and unable to get up. When he tried to help her up, he noted that she was completely flaccid on her left side. Boyfriend's initial plan was apparently to drive her to the hospital himself. He apparently delayed calling EMS until 8:37 PM, when he realized he was unable to transport her to the hospital on his own. On EMS arrival, she was densely plegic on the left, with rightward gaze and left hemineglect. Vitals per EMS: BP 106/60, CBG 146, HR 120.   LSN: [id number] tPA Given: No: Out of the time window.  Thrombectomy candidate: No. Large ischemic right MCA infarction with ASPECTS of 0 NIHSS: 17  Past Medical History:  Diagnosis Date  . Anxiety and depression   . Hypothyroid   . Migraine   . MVP (mitral valve prolapse)   . Opioid abuse, in remission (HCC)   . Polysubstance abuse [facility])     Past Surgical History:  Procedure Laterality Date  . KNEE ARTHROPLASTY    . KNEE SURGERY    . MOUTH SURGERY      Family History  Problem Relation Age of Onset  . Diabetes Other   . Hypertension Other   . Thyroid disease Other    Social History:  reports that she has been smoking cigarettes. She has been smoking about 1.00 pack per day. She has never used smokeless tobacco. She reports current drug use. Drug: Heroin. She reports that she does not drink alcohol.  Allergies: No Known Allergies  Home Medications:  No current facility-administered medications on file prior to encounter.   Current Outpatient Medications on File Prior to Encounter  Medication Sig Dispense Refill  . levothyroxine (LEVOXYL)  75 MCG tablet Take 75 mcg by mouth daily before breakfast. (Patient not taking: Reported on 02/20/2020)       ROS: The patient is complaining of right lower quadrant abdominal pain and feeling cold. She is also complaining of severe thirst. No other complaints except as noted in the HPI.   Physical Examination: There were no vitals taken for this visit.  HEENT: [place]/AT Lungs: Respirations unlabored Abdominal: Severe TTP RLQ Ext: No edema.   Neurologic Examination: Mental Status: Awake with decreased level of alertness. Oriented to hospital, city, state, year and month, but not day. Speech is fluent with intact comprehension and naming. Mild dysarthria noted. Anosognosia and left hemineglect are noted. Is unaware of her left sided weakness but states "they think I am having a stroke". She states she is here for severe left abdominal pain.  Cranial Nerves: II:  Left visual field cut. PERRL III,IV, VI: No ptosis. Conjugate right gaze deviation. Unable to volitionally gaze past midline to the left.   V,VII: Left facial droop. Insensate on the left.   VIII: Hearing intact to voice IX,X: No hoarseness or hypophonia XI: Head deviated to the right XII: Mild dysarthria Motor: RUE and RLE 5/5 LUE flaccid without volitional movement. Extensor posturing to pinch.  LLE flaccid without volitional movement. Weak knee flexion to pinch.  Sensory: Pinprick and light touch intact throughout, bilaterally Deep Tendon  Reflexes:  Depressed on the left.  Plantars: Right: Downgoing   Left: Mute Cerebellar: No ataxia with FNF on the right. Unable to perform on the left.  Gait: Unable to assess  No results found for this or any previous visit (from the past 48 hour(s)). No results found.  Assessment: 39 y.o. female IV drug abuser presenting with acute onset of left hemiplegia, left visual field cut and left hemineglect. [name] was 9 AM today per boyfriend. He found her down at 3 PM but delayed calling EMS  until 8:37 PM after he realized he was unable to transport her to the hospital on his own.  1. Exam reveals dense left hemiplegia, left facial droop, rightward eye deviation and left hemineglect.  2. CT head: Large acute/subacute right MCA territory infarct. No intracranial hemorrhage. ASPECTS is 0. 3. CTA of head and neck: Occlusion of the proximal right M1 segment with limited collateralization within the anterior right MCA territory.  4. Not a tPA candidate due to time criteria. Not a thrombectomy candidate based on strict criteria due to ASPECTS of 0, but may be a candidate for off-label procedure if CTP shows significant salvageable penumbra relative to what is likely to be a very large core. Discussed with Dr. Corliss Skains.  5. Stroke Risk Factors - IV drug abuse 6. DDx for mechanism of action for the patient's stroke includes septic embolization from possible SBE, hypercoagulability or distal embolization of non-soluble contaminants from IVDA. RCVS is unlikely as the right M1 occlusion is the only significant CTA finding without multifocal stenoses being seen on the scan.  7. Hypotension and chills. May be due to sepsis.  8. RLQ pain with significant tenderness to palpation. DDx includes appendicitis and ischemic bowel from septic embolization.   Recommendations: 1. STAT CT perfusion scan has been ordered. 2. Hypertonic saline has been ordered.  3. Repeat CT head in 6 hours (ordered).  4. Will need MRI brain tomorrow.  5. NPO 6. IVF and possible pressors for hypotension. SBP goal of 130 -160 if pressors are needed. Will need to use caution with pressors as she is at significant risk for hemorrhagic transformation of the large right MCA stroke.  7. Blood cultures x 2 8. Expedited TTE for possible valvular vegetation 9. Sepsis work up 10. Imaging of abdomen and pelvis to assess for possible RLQ abscess or ischemic bowel. HgbA1c, fasting lipid panel 11. Will need to consult CCM for admission of  this unstable patient with possible sepsis who is at high risk for acute decompensation.  12. Rectal ASA 300 mg qd, first dose now (ordered) 13. Frequent neuro checks 14. Cardiac telemetry 15. DVT prophylaxis  85 minutes spent in the emergent neurological evaluation and management of this critically ill patient  Addendum: - CTP images personally reviewed and discussed with Dr. Chase Picket.  - Core infarct volume is significantly underestimated by the RAPID software when comparing to images from the initial CT scan - Using hypodense brain parenchyma on initial CT as the core infarct volume yields essentially no viable penumbra when comparing to the volume of underperfused + infarcted tissue (Tmax > 6 seconds = 145 mL) on the CTP images.  - The patient is not a candidate for thrombectomy  @Electronically  signed: Dr. 06/29/2020, 9:46 PM

## 2020-07-07 NOTE — ED Triage Notes (Signed)
Pt BIB [facility] from home, LKW 0900 this morning, found by her boyfriend at 1500 today, when he was unable to get pt to hospital on his own, he called EMS, code stroke activated in the field. On arrival, pt with left facial droop, left side paralysis and left neglect. Hx IV drug use, pt reports last use yesterday.

## 2020-07-07 NOTE — H&P (Signed)
NAME:  Terri Oneal, MRN:  [id number], DOB:  06-27-1981, LOS: 0 ADMISSION DATE:  07/24/2020, CONSULTATION DATE:  07/11/2020 REFERRING MD:  Dr. Otelia Limes, CHIEF COMPLAINT:  Sepsis    Brief History   39yo female who presented as a code stroke with reported left hemiparesis and right gaze. Head CT on arrival positive for occlusion of right M1 with limited collaterals with associated large MCA infarct   History of present illness   Terri Oneal is a 38yo female with PMH significant for polysubstance abuse including IV heroin, prior endocarditis with mitral valve prolapse, hypothyroidism, Anxiety, and depression who presented to the ED as a code stroke due to left hemiparesis and right gaze. [name] was 0900, per chart review patients boyfriend returned home at 1500 and found her with left weakness. EMS was not called until 2037. Head CT on arrival positive for occlusion of right M1 with limited collaterals with associated large MCA infarct. Neurology was consulted on arrival. Unfortunately patient was not a tPA candidate due to time criteria and not a thrombectomy candidate due to ASPECTS of 0.   Additional patient meets criteria for sepsis on admission with tachypnea, tachycardia, leukocytosis, and lactic acidosis on arrival.  Relevant lab work includes sodium 132, glucose 111, alkaline phosphatase 157, albumin 1.9, AST 42, WBC 19.9, hemoglobin 8.7, hematocrit 26.2. patient also endorsed significant abdominal tenderness on exam prompting CT abdomen and pelvis which revealed complex free fluid in the right paracolic gutter suspicious for hemoperitoneum as well as splenomegaly with likely several areas of splenic infarcts.  Consulted for further management and admission.  Past Medical History  Polysubstance abuse including IV heroin Prior endocarditis Mitral valve prolapse Hypothyroidism Anxiety Depression  Significant Hospital Events   Admitted with code stroke 12/14  Consults:  Neurology Neuro  interventional radiologist  Procedures:    Significant Diagnostic Tests:   CT angio head and neck 12/14 > occlusion of the proximal right M1 and collaterals resulting in large right MCA infarct  CT abdomen pelvis 12/14 > CT abdomen and pelvis which revealed complex free fluid in the right paracolic gutter suspicious for hemoperitoneum as well as splenomegaly with likely several areas of splenic infarcts  Cerebral perfusion study 12/14 > 35 mL core infarct in the right MCA territory with 110 mL surrounding ischemic penumbra.  Micro Data:  COVID 12/14 > negative Blood cultures 12/14 >  Antimicrobials:    Interim history/subjective:  Lying on ED stretcher with continued complaints of and request of something to drink as able to state name, date of birth, and that she is at [facility].  Objective   Blood pressure 107/62, pulse (!) 120, temperature 97.6 F (36.4 C), temperature source Temporal, resp. rate (!) 28, SpO2 94 %.       No intake or output data in the 24 hours ending 07/12/2020 2353 There were no vitals filed for this visit.  Examination: General: Deconditioned adult female appears older than stated age lying on ED stretcher in moderate discomfort from abdominal tenderness  HEENT: Dry mucous membranes, PERRL, right gaze preference but will track to voice Neuro: Alert and oriented times, left hemiparesis, right gaze preference but will track to voice, spontaneous movement to right extremities seen CV: Tachycardia, s1s2 regular rate and rhythm, likely aortic murmur, rubs, or gallops,  PULM: Tachypnea with shallow respirations, no cough, currently protecting airway, oxygen saturations appropriate on room air GI: soft, bowel sounds-hypoactive in all 4 quadrants, very tender to palpation in all quadrants, mildly distended Extremities:  warm/dry, no edema  Skin: no rashes or lesions, Janeway lesion to left lower extremity, track marks to both upper and lower extremity  Resolved  Hospital Problem list     Assessment & Plan:  Sepsis -Patient meets criteria for sepsis on admission given tachycardia, tachypnea,: AMS, leukocytosis, and positive lactic acidosis -Source is likely endocarditis from known IV drug abuse P: Admit ICU Supplemental oxygen Pan cultures prior to antibiotic IV antibiotics vanc and Zosyn  Aggressive IV hydration provided on admission MAP goal < 65 may utilize pressor support if needed  SBP goal 1 30-1 60 as patient is at risk for hemorrhagic conversion given large stroke Trend lactic acid Monitor urine output Capillary refill:  Large acute/subacute right MCA worked -Has known normal at 9 AM day of admission, EMS was not notified until 8:37 PM -Patient presented with left hemiplegia, left facial droop, rightward gaze deviation, and left hemineglect -Unfortunately patient was not a candidate for TPA or thrombectomy on arrival P: Management per neurology Maintain neuro protective measures; goal for eurothermia, euglycemia, eunatermia, normoxia, and PCO2 goal of 35-40 Nutrition and bowel regiment  Seizure precautions  Aspirations precautions  Echocardiogram pending Repeat head CT and obtain MRI per neurology Hypertonic saline per neurology Rectal aspirin Frequent neurochecks At high risk for deterioration, monitor ability to protect airway closely Cardiac telemetry  Multiple abnormalities seen on abdominal CT scan including: Splenic infarcts Complex free fluid  -Seen in the right pericolonic gutter possibly secondary to hemorrhagic ovarian cyst Generalized edema the subcutaneous and intra-abdominal fat P: Patient would benefit from contrast-enhanced study, I may consider this soon if kidney function allows Broad-spectrum IV antibiotics as above Trend H&H  Obtain urine culture  Polysubstance abuse -Patient reported use of IV heroin 1 day prior to admission P: Cessation education will be provided when appropriate Adequate pain  control as able Monitor for withdrawal symptoms  Best practice (evaluated daily)   Diet: N.p.o. Pain/Anxiety/Delirium protocol (if indicated): As needed VAP protocol (if indicated): Not applicable DVT prophylaxis: SCDs GI prophylaxis: PPI Glucose control: Monitor Mobility: Bedrest last date of multidisciplinary goals of care discussion pending Family and staff present: Pending Summary of discussion: Pending Follow up goals of care discussion due: Pending Code Status: Full Disposition: ICU  Labs   CBC: Recent Labs  Lab 06/24/2020 2136 06/26/2020 2210  WBC 19.9*  --   NEUTROABS 17.7*  --   HGB 8.0* 8.2*  HCT 26.9* 24.0*  MCV 95.4  --   PLT 103*  --     Basic Metabolic Panel: Recent Labs  Lab 07/01/2020 2136 07/18/2020 2210 06/26/2020 2222  NA 132* 132* 130*  K 4.1 4.1  --   CL 99 100  --   CO2 20*  --   --   GLUCOSE 111* 108*  --   BUN 20 22*  --   CREATININE 0.97 0.80  --   CALCIUM 7.9*  --   --    GFR: CrCl cannot be calculated (Unknown ideal weight.). Recent Labs  Lab 07/05/2020 2136 07/05/2020 2239  WBC 19.9*  --   LATICACIDVEN  --  3.2*    Liver Function Tests: Recent Labs  Lab 07/08/2020 2136  AST 42*  ALT 18  ALKPHOS 157*  BILITOT 3.5*  PROT 6.2*  ALBUMIN 1.9*   No results for input(s): LIPASE, AMYLASE in the last 168 hours. No results for input(s): AMMONIA in the last 168 hours.  ABG    Component Value Date/Time   TCO2 23 07/21/2020  2210     Coagulation Profile: Recent Labs  Lab 07/17/2020 2136  INR 1.4*    Cardiac Enzymes: No results for input(s): CKTOTAL, CKMB, CKMBINDEX, TROPONINI in the last 168 hours.  HbA1C: No results found for: HGBA1C  CBG: No results for input(s): GLUCAP in the last 168 hours.  Review of Systems: Positives in bold  Gen: Denies fever, chills, weight change, fatigue, night sweats HEENT: Denies blurred vision, double vision, hearing loss, tinnitus, sinus congestion, rhinorrhea, sore throat, neck stiffness,  dysphagia PULM: Denies shortness of breath, cough, sputum production, hemoptysis, wheezing CV: Denies chest pain, edema, orthopnea, paroxysmal nocturnal dyspnea, palpitations GI: Denies abdominal pain, nausea, vomiting, diarrhea, hematochezia, melena, constipation, change in bowel habits GU: Denies dysuria, hematuria, polyuria, oliguria, urethral discharge Endocrine: Denies hot or cold intolerance, polyuria, polyphagia or appetite change Derm: Denies rash, dry skin, scaling or peeling skin change Heme: Denies easy bruising, bleeding, bleeding gums Neuro: Denies headache, numbness, weakness, slurred speech, loss of memory or consciousness   Past Medical History  She,  has a past medical history of Anxiety and depression, Hypothyroid, Migraine, MVP (mitral valve prolapse), Opioid abuse, in remission (HCC), and Polysubstance abuse (HCC).   Surgical History    Past Surgical History:  Procedure Laterality Date  . KNEE ARTHROPLASTY    . KNEE SURGERY    . MOUTH SURGERY       Social History   reports that she has been smoking cigarettes. She has been smoking about 1.00 pack per day. She has never used smokeless tobacco. She reports current alcohol use. She reports current drug use. Drugs: Heroin and IV.   Family History   Her family history includes Diabetes in an other family member; Hypertension in an other family member; Thyroid disease in an other family member.   Allergies No Known Allergies   Home Medications  Prior to Admission medications   Medication Sig Start Date End Date Taking? Authorizing Provider  ibuprofen (ADVIL) 200 MG tablet Take 600-800 mg by mouth every 6 (six) hours as needed for headache or mild pain.   Yes [provider]     Critical care time:   CRITICAL CARE Performed by: Delfin Gant  Total critical care time: 50 minutes  Critical care time was exclusive of separately billable procedures and treating other patients.  Critical care was  necessary to treat or prevent imminent or life-threatening deterioration.  Critical care was time spent personally by me on the following activities: development of treatment plan with patient and/or surrogate as well as nursing, discussions with consultants, evaluation of patient's response to treatment, examination of patient, obtaining history from patient or surrogate, ordering and performing treatments and interventions, ordering and review of laboratory studies, ordering and review of radiographic studies, pulse oximetry and re-evaluation of patient's condition.  Delfin Gant, NP-C [facility] Contact / Pager information can be found on Amion  07/21/2020, 12:46 AM

## 2020-07-07 NOTE — Progress Notes (Signed)
Pharmacy Antibiotic Note  Terri Oneal is a 39 y.o. female admitted on 07/19/2020 with sepsis.  Pharmacy has been consulted for Vancomycin and Zosyn dosing.  Ht 63 in Wt 61.7 kg Estimated CrCl ~100 ml/min  Plan: Zosyn 3.375gm IV q8h Vancomycin 1250mg  IV now then 1000 mg IV Q 8 hrs. Will f/u renal function, micro data, and pt's clinical condition Vanc levels prn      Temp (24hrs), Avg:97.6 F (36.4 C), Min:97.6 F (36.4 C), Max:97.6 F (36.4 C)  Recent Labs  Lab 06/29/2020 2136 07/24/2020 2210  WBC 19.9*  --   CREATININE 0.97 0.80    CrCl cannot be calculated (Unknown ideal weight.).    No Known Allergies  Antimicrobials this admission: 12/14 Vanc >>  12/14 Zosyn >>  Microbiology results: 12/14 BCx:   Thank you for allowing pharmacy to be a part of this patient's care.  1/15, PharmD, BCPS Please see amion for complete clinical pharmacist phone list 06/29/2020 11:33 PM

## 2020-07-08 ENCOUNTER — Inpatient Hospital Stay (HOSPITAL_COMMUNITY): Payer: Self-pay | Admitting: Certified Registered"

## 2020-07-08 ENCOUNTER — Inpatient Hospital Stay (HOSPITAL_COMMUNITY): Payer: Self-pay

## 2020-07-08 ENCOUNTER — Encounter (HOSPITAL_COMMUNITY): Admission: EM | Disposition: E | Payer: Self-pay | Source: Home / Self Care | Attending: Pulmonary Disease

## 2020-07-08 ENCOUNTER — Other Ambulatory Visit: Payer: Self-pay | Admitting: Neurosurgery

## 2020-07-08 ENCOUNTER — Encounter (HOSPITAL_COMMUNITY): Payer: Self-pay | Admitting: Pulmonary Disease

## 2020-07-08 ENCOUNTER — Inpatient Hospital Stay (HOSPITAL_COMMUNITY): Payer: Medicaid Other

## 2020-07-08 DIAGNOSIS — D62 Acute posthemorrhagic anemia: Secondary | ICD-10-CM

## 2020-07-08 DIAGNOSIS — I63511 Cerebral infarction due to unspecified occlusion or stenosis of right middle cerebral artery: Principal | ICD-10-CM

## 2020-07-08 DIAGNOSIS — I361 Nonrheumatic tricuspid (valve) insufficiency: Secondary | ICD-10-CM

## 2020-07-08 DIAGNOSIS — D735 Infarction of spleen: Secondary | ICD-10-CM

## 2020-07-08 DIAGNOSIS — R6510 Systemic inflammatory response syndrome (SIRS) of non-infectious origin without acute organ dysfunction: Secondary | ICD-10-CM

## 2020-07-08 DIAGNOSIS — B952 Enterococcus as the cause of diseases classified elsewhere: Secondary | ICD-10-CM

## 2020-07-08 DIAGNOSIS — R1900 Intra-abdominal and pelvic swelling, mass and lump, unspecified site: Secondary | ICD-10-CM

## 2020-07-08 DIAGNOSIS — I340 Nonrheumatic mitral (valve) insufficiency: Secondary | ICD-10-CM

## 2020-07-08 DIAGNOSIS — I639 Cerebral infarction, unspecified: Secondary | ICD-10-CM

## 2020-07-08 DIAGNOSIS — I76 Septic arterial embolism: Secondary | ICD-10-CM

## 2020-07-08 DIAGNOSIS — R509 Fever, unspecified: Secondary | ICD-10-CM

## 2020-07-08 DIAGNOSIS — R7881 Bacteremia: Secondary | ICD-10-CM

## 2020-07-08 DIAGNOSIS — D72829 Elevated white blood cell count, unspecified: Secondary | ICD-10-CM

## 2020-07-08 DIAGNOSIS — I38 Endocarditis, valve unspecified: Secondary | ICD-10-CM

## 2020-07-08 DIAGNOSIS — I330 Acute and subacute infective endocarditis: Secondary | ICD-10-CM

## 2020-07-08 HISTORY — PX: CRANIOTOMY: SHX93

## 2020-07-08 LAB — LIPID PANEL
Cholesterol: 102 mg/dL (ref 0–200)
HDL: <10 mg/dL — ABNORMAL LOW (ref >40)
Triglycerides: 192 mg/dL — ABNORMAL HIGH (ref <150)
VLDL: 38 mg/dL (ref 0–40)

## 2020-07-08 LAB — URINALYSIS, ROUTINE W REFLEX MICROSCOPIC
Bilirubin Urine: NEGATIVE
Glucose, UA: NEGATIVE mg/dL
Hgb urine dipstick: NEGATIVE
Ketones, ur: NEGATIVE mg/dL
Leukocytes,Ua: NEGATIVE
Nitrite: POSITIVE — AB
Protein, ur: NEGATIVE mg/dL
Specific Gravity, Urine: >1.046 — ABNORMAL HIGH (ref 1.005–1.030)
pH: 5 (ref 5.0–8.0)

## 2020-07-08 LAB — POCT I-STAT 7, (LYTES, BLD GAS, ICA,H+H)
Acid-base deficit: 2 mmol/L (ref 0.0–2.0)
Acid-base deficit: 2 mmol/L (ref 0.0–2.0)
Bicarbonate: 20.8 mmol/L (ref 20.0–28.0)
Bicarbonate: 21.9 mmol/L (ref 20.0–28.0)
Calcium, Ion: 1.13 mmol/L — ABNORMAL LOW (ref 1.15–1.40)
Calcium, Ion: 1.14 mmol/L — ABNORMAL LOW (ref 1.15–1.40)
HCT: 18 % — ABNORMAL LOW (ref 36.0–46.0)
HCT: 25 % — ABNORMAL LOW (ref 36.0–46.0)
Hemoglobin: 6.1 g/dL — CL (ref 12.0–15.0)
Hemoglobin: 8.5 g/dL — ABNORMAL LOW (ref 12.0–15.0)
O2 Saturation: 94 %
O2 Saturation: 100 %
Patient temperature: 99.3
Patient temperature: 98.8
Potassium: 3.7 mmol/L (ref 3.5–5.1)
Potassium: 3.7 mmol/L (ref 3.5–5.1)
Sodium: 140 mmol/L (ref 135–145)
Sodium: 144 mmol/L (ref 135–145)
TCO2: 22 mmol/L (ref 22–32)
TCO2: 23 mmol/L (ref 22–32)
pCO2 arterial: 26.8 mmHg — ABNORMAL LOW (ref 32.0–48.0)
pCO2 arterial: 32.6 mmHg (ref 32.0–48.0)
pH, Arterial: 7.499 — ABNORMAL HIGH (ref 7.350–7.450)
pH, Arterial: 7.436 (ref 7.350–7.450)
pO2, Arterial: 63 mmHg — ABNORMAL LOW (ref 83.0–108.0)
pO2, Arterial: 277 mmHg — ABNORMAL HIGH (ref 83.0–108.0)

## 2020-07-08 LAB — BLOOD CULTURE ID PANEL (REFLEXED) - BCID2

## 2020-07-08 LAB — CBC
HCT: 21.1 % — ABNORMAL LOW (ref 36.0–46.0)
HCT: 28.6 % — ABNORMAL LOW (ref 36.0–46.0)
HCT: 28.6 % — ABNORMAL LOW (ref 36.0–46.0)
Hemoglobin: 6.5 g/dL — CL (ref 12.0–15.0)
Hemoglobin: 8.6 g/dL — ABNORMAL LOW (ref 12.0–15.0)
Hemoglobin: 8.7 g/dL — ABNORMAL LOW (ref 12.0–15.0)
MCH: 28.3 pg (ref 26.0–34.0)
MCH: 28.5 pg (ref 26.0–34.0)
MCH: 28.7 pg (ref 26.0–34.0)
MCHC: 30.1 g/dL (ref 30.0–36.0)
MCHC: 30.4 g/dL (ref 30.0–36.0)
MCHC: 30.8 g/dL (ref 30.0–36.0)
MCV: 92.5 fL (ref 80.0–100.0)
MCV: 94.1 fL (ref 80.0–100.0)
MCV: 94.4 fL (ref 80.0–100.0)
Platelets: 113 10*3/uL — ABNORMAL LOW (ref 150–400)
Platelets: 115 10*3/uL — ABNORMAL LOW (ref 150–400)
Platelets: 83 10*3/uL — ABNORMAL LOW (ref 150–400)
RBC: 2.28 MIL/uL — ABNORMAL LOW (ref 3.87–5.11)
RBC: 3.03 MIL/uL — ABNORMAL LOW (ref 3.87–5.11)
RBC: 3.04 MIL/uL — ABNORMAL LOW (ref 3.87–5.11)
RDW: 19.9 % — ABNORMAL HIGH (ref 11.5–15.5)
RDW: 22.3 % — ABNORMAL HIGH (ref 11.5–15.5)
RDW: 22.5 % — ABNORMAL HIGH (ref 11.5–15.5)
WBC: 16.6 10*3/uL — ABNORMAL HIGH (ref 4.0–10.5)
WBC: 20.7 10*3/uL — ABNORMAL HIGH (ref 4.0–10.5)
WBC: 24.5 10*3/uL — ABNORMAL HIGH (ref 4.0–10.5)
nRBC: 0.2 % (ref 0.0–0.2)
nRBC: 0.3 % — ABNORMAL HIGH (ref 0.0–0.2)
nRBC: 0.5 % — ABNORMAL HIGH (ref 0.0–0.2)

## 2020-07-08 LAB — RAPID URINE DRUG SCREEN, HOSP PERFORMED
Amphetamines: NOT DETECTED
Barbiturates: NOT DETECTED
Benzodiazepines: NOT DETECTED
Cocaine: NOT DETECTED
Opiates: NOT DETECTED
Tetrahydrocannabinol: NOT DETECTED

## 2020-07-08 LAB — GLUCOSE, CAPILLARY
Glucose-Capillary: 104 mg/dL — ABNORMAL HIGH (ref 70–99)
Glucose-Capillary: 115 mg/dL — ABNORMAL HIGH (ref 70–99)
Glucose-Capillary: 92 mg/dL (ref 70–99)
Glucose-Capillary: 92 mg/dL (ref 70–99)

## 2020-07-08 LAB — LACTIC ACID, PLASMA: Lactic Acid, Venous: 1.6 mmol/L (ref 0.5–1.9)

## 2020-07-08 LAB — CREATININE, SERUM
Creatinine, Ser: 0.75 mg/dL (ref 0.44–1.00)
GFR, Estimated: >60 mL/min (ref >60)

## 2020-07-08 LAB — FIBRINOGEN: Fibrinogen: 246 mg/dL (ref 210–475)

## 2020-07-08 LAB — HEMOGLOBIN A1C
Hgb A1c MFr Bld: 4.5 % — ABNORMAL LOW (ref 4.8–5.6)
Mean Plasma Glucose: 82.45 mg/dL

## 2020-07-08 LAB — ECHOCARDIOGRAM COMPLETE
Height: 64 in
S' Lateral: 2.8 cm
Weight: 1915.36 oz

## 2020-07-08 LAB — SURGICAL PCR SCREEN
MRSA, PCR: NEGATIVE
Staphylococcus aureus: POSITIVE — AB

## 2020-07-08 LAB — BASIC METABOLIC PANEL
Anion gap: 11 (ref 5–15)
BUN: 19 mg/dL (ref 6–20)
CO2: 21 mmol/L — ABNORMAL LOW (ref 22–32)
Calcium: 7.5 mg/dL — ABNORMAL LOW (ref 8.9–10.3)
Chloride: 106 mmol/L (ref 98–111)
Creatinine, Ser: 0.77 mg/dL (ref 0.44–1.00)
GFR, Estimated: >60 mL/min (ref >60)
Glucose, Bld: 93 mg/dL (ref 70–99)
Potassium: 3.8 mmol/L (ref 3.5–5.1)
Sodium: 138 mmol/L (ref 135–145)

## 2020-07-08 LAB — SODIUM
Sodium: 140 mmol/L (ref 135–145)
Sodium: 144 mmol/L (ref 135–145)

## 2020-07-08 LAB — PREPARE RBC (CROSSMATCH)

## 2020-07-08 LAB — HIV ANTIBODY (ROUTINE TESTING W REFLEX): HIV Screen 4th Generation wRfx: NONREACTIVE

## 2020-07-08 LAB — PROTIME-INR
INR: 1.5 — ABNORMAL HIGH (ref 0.8–1.2)
Prothrombin Time: 17.4 seconds — ABNORMAL HIGH (ref 11.4–15.2)

## 2020-07-08 SURGERY — CRANIOTOMY HEMATOMA EVACUATION SUBDURAL
Anesthesia: General

## 2020-07-08 MED ORDER — SODIUM CHLORIDE 3 % IV SOLN: INTRAVENOUS | Status: DC

## 2020-07-08 MED ORDER — ROCURONIUM BROMIDE 10 MG/ML (PF) SYRINGE
PREFILLED_SYRINGE | INTRAVENOUS | Status: AC
  Administered 2020-07-08: 15:00:00 30 mg
  Filled 2020-07-08: qty 10

## 2020-07-08 MED ORDER — ACETAMINOPHEN 160 MG/5ML PO SOLN
650.0000 mg | ORAL | Status: DC | PRN
  Administered 2020-07-09 – 2020-07-10 (×5): 650 mg
  Filled 2020-07-08 (×5): qty 20.3

## 2020-07-08 MED ORDER — CHLORHEXIDINE GLUCONATE 0.12% ORAL RINSE (MEDLINE KIT)
15.0000 mL | Freq: Two times a day (BID) | OROMUCOSAL | Status: DC
  Administered 2020-07-08 – 2020-07-11 (×6): 15 mL via OROMUCOSAL

## 2020-07-08 MED ORDER — FENTANYL CITRATE (PF) 100 MCG/2ML IJ SOLN
50.0000 ug | INTRAMUSCULAR | Status: DC | PRN
Start: 2020-07-08 — End: 2020-07-11
  Administered 2020-07-09 (×2): 50 ug via INTRAVENOUS

## 2020-07-08 MED ORDER — THROMBIN 5000 UNITS EX SOLR: OROMUCOSAL | Status: DC | PRN

## 2020-07-08 MED ORDER — PANTOPRAZOLE SODIUM 40 MG IV SOLR
40.0000 mg | INTRAVENOUS | Status: DC
  Administered 2020-07-08 – 2020-07-10 (×3): 40 mg via INTRAVENOUS
  Filled 2020-07-08 (×3): qty 40

## 2020-07-08 MED ORDER — SODIUM CHLORIDE 0.9 % IV SOLN: INTRAVENOUS | Status: DC

## 2020-07-08 MED ORDER — PROPOFOL 10 MG/ML IV BOLUS
INTRAVENOUS | Status: AC
  Filled 2020-07-08: qty 20

## 2020-07-08 MED ORDER — LIDOCAINE-EPINEPHRINE 1 %-1:100000 IJ SOLN
INTRAMUSCULAR | Status: DC | PRN
  Administered 2020-07-08: 9 mL

## 2020-07-08 MED ORDER — BACITRACIN ZINC 500 UNIT/GM EX OINT
TOPICAL_OINTMENT | CUTANEOUS | Status: AC
  Filled 2020-07-08: qty 28.35

## 2020-07-08 MED ORDER — FENTANYL CITRATE (PF) 100 MCG/2ML IJ SOLN
INTRAMUSCULAR | Status: AC
  Administered 2020-07-08: 15:00:00 50 ug
  Filled 2020-07-08: qty 2

## 2020-07-08 MED ORDER — FENTANYL CITRATE (PF) 100 MCG/2ML IJ SOLN: 50.0000 ug | INTRAMUSCULAR | Status: DC | PRN

## 2020-07-08 MED ORDER — LIDOCAINE-EPINEPHRINE 1 %-1:100000 IJ SOLN
INTRAMUSCULAR | Status: AC
  Filled 2020-07-08: qty 1

## 2020-07-08 MED ORDER — MUPIROCIN 2 % EX OINT
1.0000 "application " | TOPICAL_OINTMENT | Freq: Two times a day (BID) | CUTANEOUS | Status: DC
  Administered 2020-07-08 – 2020-07-10 (×4): 1 via NASAL
  Filled 2020-07-08: qty 22

## 2020-07-08 MED ORDER — DOCUSATE SODIUM 50 MG/5ML PO LIQD
100.0000 mg | Freq: Two times a day (BID) | ORAL | Status: DC
  Administered 2020-07-08 – 2020-07-09 (×2): 100 mg
  Filled 2020-07-08 (×3): qty 10

## 2020-07-08 MED ORDER — LACTATED RINGERS IV SOLN: INTRAVENOUS | Status: DC | PRN

## 2020-07-08 MED ORDER — FENTANYL CITRATE (PF) 250 MCG/5ML IJ SOLN
INTRAMUSCULAR | Status: AC
  Filled 2020-07-08: qty 5

## 2020-07-08 MED ORDER — ALBUMIN HUMAN 5 % IV SOLN: INTRAVENOUS | Status: DC | PRN

## 2020-07-08 MED ORDER — SODIUM CHLORIDE 0.9 % IV SOLN
2.0000 g | Freq: Three times a day (TID) | INTRAVENOUS | Status: DC
  Administered 2020-07-08: 08:00:00 2 g via INTRAVENOUS
  Filled 2020-07-08: qty 2

## 2020-07-08 MED ORDER — THROMBIN 20000 UNITS EX SOLR
CUTANEOUS | Status: AC
  Filled 2020-07-08: qty 20000

## 2020-07-08 MED ORDER — THROMBIN 20000 UNITS EX SOLR: CUTANEOUS | Status: DC | PRN

## 2020-07-08 MED ORDER — FENTANYL CITRATE (PF) 100 MCG/2ML IJ SOLN
50.0000 ug | INTRAMUSCULAR | Status: AC | PRN
Start: 2020-07-08 — End: 2020-07-09
  Administered 2020-07-09 (×3): 100 ug via INTRAVENOUS
  Filled 2020-07-08 (×4): qty 2

## 2020-07-08 MED ORDER — CHLORHEXIDINE GLUCONATE CLOTH 2 % EX PADS
6.0000 | MEDICATED_PAD | Freq: Every day | CUTANEOUS | Status: DC
  Administered 2020-07-10 – 2020-07-11 (×2): 6 via TOPICAL

## 2020-07-08 MED ORDER — CHLORHEXIDINE GLUCONATE CLOTH 2 % EX PADS: 6.0000 | MEDICATED_PAD | Freq: Every day | CUTANEOUS | Status: DC

## 2020-07-08 MED ORDER — POLYETHYLENE GLYCOL 3350 17 G PO PACK
17.0000 g | PACK | Freq: Every day | ORAL | Status: DC
  Administered 2020-07-09: 11:00:00 17 g
  Filled 2020-07-08: qty 1

## 2020-07-08 MED ORDER — FENTANYL CITRATE (PF) 100 MCG/2ML IJ SOLN
INTRAMUSCULAR | Status: AC
  Administered 2020-07-08: 04:00:00 50 ug via INTRAVENOUS
  Filled 2020-07-08: qty 2

## 2020-07-08 MED ORDER — CEFAZOLIN SODIUM-DEXTROSE 2-4 GM/100ML-% IV SOLN: 2.0000 g | INTRAVENOUS | Status: DC

## 2020-07-08 MED ORDER — MIDAZOLAM HCL 2 MG/2ML IJ SOLN
INTRAMUSCULAR | Status: AC
  Administered 2020-07-08: 15:00:00 2 mg
  Filled 2020-07-08: qty 4

## 2020-07-08 MED ORDER — CHLORHEXIDINE GLUCONATE CLOTH 2 % EX PADS
6.0000 | MEDICATED_PAD | Freq: Every day | CUTANEOUS | Status: DC
  Administered 2020-07-09 – 2020-07-10 (×3): 6 via TOPICAL

## 2020-07-08 MED ORDER — LIDOCAINE 2% (20 MG/ML) 5 ML SYRINGE
INTRAMUSCULAR | Status: AC
  Filled 2020-07-08: qty 5

## 2020-07-08 MED ORDER — SODIUM CHLORIDE 0.9% IV SOLUTION: Freq: Once | INTRAVENOUS | Status: AC

## 2020-07-08 MED ORDER — LORAZEPAM 2 MG/ML IJ SOLN
INTRAMUSCULAR | Status: AC
  Filled 2020-07-08: qty 1

## 2020-07-08 MED ORDER — ROCURONIUM BROMIDE 10 MG/ML (PF) SYRINGE
PREFILLED_SYRINGE | INTRAVENOUS | Status: AC
  Filled 2020-07-08: qty 10

## 2020-07-08 MED ORDER — SODIUM CHLORIDE 0.9% FLUSH: 10.0000 mL | INTRAVENOUS | Status: DC | PRN

## 2020-07-08 MED ORDER — ROCURONIUM BROMIDE 10 MG/ML (PF) SYRINGE
PREFILLED_SYRINGE | INTRAVENOUS | Status: DC | PRN
  Administered 2020-07-08 (×2): 50 mg via INTRAVENOUS

## 2020-07-08 MED ORDER — PROPOFOL 1000 MG/100ML IV EMUL
INTRAVENOUS | Status: AC
  Filled 2020-07-08: qty 100

## 2020-07-08 MED ORDER — THROMBIN 5000 UNITS EX SOLR
CUTANEOUS | Status: AC
  Filled 2020-07-08: qty 5000

## 2020-07-08 MED ORDER — ORAL CARE MOUTH RINSE
15.0000 mL | OROMUCOSAL | Status: DC
  Administered 2020-07-08 – 2020-07-11 (×26): 15 mL via OROMUCOSAL

## 2020-07-08 MED ORDER — BUPIVACAINE HCL (PF) 0.5 % IJ SOLN
INTRAMUSCULAR | Status: DC | PRN
  Administered 2020-07-08: 9 mL

## 2020-07-08 MED ORDER — CHLORHEXIDINE GLUCONATE CLOTH 2 % EX PADS
6.0000 | MEDICATED_PAD | Freq: Once | CUTANEOUS | Status: AC
  Administered 2020-07-08: 16:00:00 6 via TOPICAL

## 2020-07-08 MED ORDER — SODIUM CHLORIDE 0.9 % IV SOLN
INTRAVENOUS | Status: DC | PRN
  Administered 2020-07-08: 08:00:00 500 mL via INTRAVENOUS

## 2020-07-08 MED ORDER — PROPOFOL 1000 MG/100ML IV EMUL
0.0000 ug/kg/min | INTRAVENOUS | Status: DC
  Administered 2020-07-08: 16:00:00 15 ug/kg/min via INTRAVENOUS
  Administered 2020-07-08: 30 ug/kg/min via INTRAVENOUS
  Administered 2020-07-09: 14:00:00 40 ug/kg/min via INTRAVENOUS
  Administered 2020-07-09: 22:00:00 30 ug/kg/min via INTRAVENOUS
  Administered 2020-07-10 – 2020-07-11 (×3): 40 ug/kg/min via INTRAVENOUS
  Filled 2020-07-08 (×7): qty 100

## 2020-07-08 MED ORDER — BACITRACIN ZINC 500 UNIT/GM EX OINT
TOPICAL_OINTMENT | CUTANEOUS | Status: DC | PRN
  Administered 2020-07-08: 1 via TOPICAL

## 2020-07-08 MED ORDER — FENTANYL CITRATE (PF) 100 MCG/2ML IJ SOLN
25.0000 ug | INTRAMUSCULAR | Status: DC | PRN
  Administered 2020-07-08 (×2): 50 ug via INTRAVENOUS
  Filled 2020-07-08: qty 2

## 2020-07-08 MED ORDER — LORAZEPAM 2 MG/ML IJ SOLN
1.0000 mg | Freq: Once | INTRAMUSCULAR | Status: AC
  Administered 2020-07-08: 05:00:00 1 mg via INTRAVENOUS

## 2020-07-08 MED ORDER — ETOMIDATE 2 MG/ML IV SOLN
INTRAVENOUS | Status: AC
  Administered 2020-07-08: 15:00:00 10 mg
  Filled 2020-07-08: qty 20

## 2020-07-08 MED ORDER — PHENYLEPHRINE HCL-NACL 10-0.9 MG/250ML-% IV SOLN
0.0000 ug/min | INTRAVENOUS | Status: DC
  Administered 2020-07-09: 06:00:00 30 ug/min via INTRAVENOUS
  Administered 2020-07-09: 20:00:00 35 ug/min via INTRAVENOUS
  Administered 2020-07-09: 22:00:00 15 ug/min via INTRAVENOUS
  Administered 2020-07-09: 02:00:00 20 ug/min via INTRAVENOUS
  Administered 2020-07-09: 16:00:00 35 ug/min via INTRAVENOUS
  Administered 2020-07-10: 12:00:00 40 ug/min via INTRAVENOUS
  Administered 2020-07-10: 16:00:00 30 ug/min via INTRAVENOUS
  Administered 2020-07-10: 09:00:00 40 ug/min via INTRAVENOUS
  Filled 2020-07-08 (×5): qty 250
  Filled 2020-07-08: qty 500
  Filled 2020-07-08 (×2): qty 250

## 2020-07-08 MED ORDER — CHLORHEXIDINE GLUCONATE CLOTH 2 % EX PADS: 6.0000 | MEDICATED_PAD | Freq: Once | CUTANEOUS | Status: DC

## 2020-07-08 MED ORDER — SUCCINYLCHOLINE CHLORIDE 200 MG/10ML IV SOSY
PREFILLED_SYRINGE | INTRAVENOUS | Status: AC
  Filled 2020-07-08: qty 10

## 2020-07-08 MED ORDER — SODIUM CHLORIDE 0.9 % IV SOLN
2.0000 g | Freq: Two times a day (BID) | INTRAVENOUS | Status: DC
  Administered 2020-07-08 – 2020-07-10 (×4): 2 g via INTRAVENOUS
  Filled 2020-07-08: qty 20
  Filled 2020-07-08 (×2): qty 2
  Filled 2020-07-08: qty 20
  Filled 2020-07-08 (×2): qty 2

## 2020-07-08 MED ORDER — SODIUM CHLORIDE 0.9 % IV SOLN
INTRAVENOUS | Status: DC | PRN
  Administered 2020-07-08: 16:00:00 500 mL via INTRAVENOUS

## 2020-07-08 MED ORDER — HEMOSTATIC AGENTS (NO CHARGE) OPTIME
TOPICAL | Status: DC | PRN
  Administered 2020-07-08: 1 via TOPICAL

## 2020-07-08 MED ORDER — ONDANSETRON HCL 4 MG/2ML IJ SOLN
INTRAMUSCULAR | Status: AC
  Filled 2020-07-08: qty 2

## 2020-07-08 MED ORDER — SODIUM CHLORIDE 0.9 % IV SOLN
2.0000 g | INTRAVENOUS | Status: DC
  Administered 2020-07-08 – 2020-07-10 (×10): 2 g via INTRAVENOUS
  Filled 2020-07-08 (×2): qty 2000
  Filled 2020-07-08: qty 2
  Filled 2020-07-08: qty 2000
  Filled 2020-07-08: qty 2
  Filled 2020-07-08: qty 2000
  Filled 2020-07-08: qty 2
  Filled 2020-07-08: qty 2000
  Filled 2020-07-08 (×3): qty 2
  Filled 2020-07-08 (×4): qty 2000

## 2020-07-08 MED ORDER — 0.9 % SODIUM CHLORIDE (POUR BTL) OPTIME
TOPICAL | Status: DC | PRN
  Administered 2020-07-08 (×3): 1000 mL

## 2020-07-08 MED ORDER — BUPIVACAINE HCL (PF) 0.5 % IJ SOLN
INTRAMUSCULAR | Status: AC
  Filled 2020-07-08: qty 30

## 2020-07-08 MED ORDER — SODIUM CHLORIDE 0.9% FLUSH
10.0000 mL | Freq: Two times a day (BID) | INTRAVENOUS | Status: DC
  Administered 2020-07-08: 21:00:00 40 mL
  Administered 2020-07-08: 12:00:00 20 mL
  Administered 2020-07-09 (×2): 10 mL
  Administered 2020-07-10: 11:00:00 30 mL
  Administered 2020-07-10 – 2020-07-13 (×2): 10 mL

## 2020-07-08 SURGICAL SUPPLY — 73 items
APL SKNCLS STERI-STRIP NONHPOA (GAUZE/BANDAGES/DRESSINGS)
BENZOIN TINCTURE PRP APPL 2/3 (GAUZE/BANDAGES/DRESSINGS) IMPLANT
BLADE CLIPPER SURG (BLADE) ×3 IMPLANT
BNDG GAUZE ELAST 4 BULKY (GAUZE/BANDAGES/DRESSINGS) IMPLANT
BUR ACORN 6.0 PRECISION (BURR) ×2 IMPLANT
BUR ACORN 6.0MM PRECISION (BURR) ×1
BUR MATCHSTICK NEURO 3.0 LAGG (BURR) IMPLANT
BUR SPIRAL ROUTER 2.3 (BUR) ×1 IMPLANT
BUR SPIRAL ROUTER 2.3MM (BUR) ×1
CANISTER SUCT 3000ML PPV (MISCELLANEOUS) ×5 IMPLANT
CARTRIDGE OIL MAESTRO DRILL (MISCELLANEOUS) ×1 IMPLANT
CLIP RANEY DISP (INSTRUMENTS) ×2 IMPLANT
CLIP VESOCCLUDE MED 6/CT (CLIP) IMPLANT
COVER WAND RF STERILE (DRAPES) ×1 IMPLANT
DIFFUSER DRILL AIR PNEUMATIC (MISCELLANEOUS) ×3 IMPLANT
DRAPE NEUROLOGICAL W/INCISE (DRAPES) ×3 IMPLANT
DRAPE SURG 17X23 STRL (DRAPES) IMPLANT
DRAPE WARM FLUID 44X44 (DRAPES) ×3 IMPLANT
DRSG TELFA 3X8 NADH (GAUZE/BANDAGES/DRESSINGS) ×3 IMPLANT
DURAGUARD 06CMX08CM ×4 IMPLANT
DURAPREP 6ML APPLICATOR 50/CS (WOUND CARE) ×3 IMPLANT
ELECT REM PT RETURN 9FT ADLT (ELECTROSURGICAL) ×3
ELECTRODE REM PT RTRN 9FT ADLT (ELECTROSURGICAL) ×1 IMPLANT
EVACUATOR 1/8 PVC DRAIN (DRAIN) IMPLANT
EVACUATOR SILICONE 100CC (DRAIN) IMPLANT
GAUZE 4X4 16PLY RFD (DISPOSABLE) IMPLANT
GAUZE SPONGE 4X4 12PLY STRL (GAUZE/BANDAGES/DRESSINGS) ×1 IMPLANT
GLOVE BIO SURGEON STRL SZ7 (GLOVE) ×2 IMPLANT
GLOVE BIO SURGEON STRL SZ7.5 (GLOVE) IMPLANT
GLOVE BIOGEL PI IND STRL 7.5 (GLOVE) ×2 IMPLANT
GLOVE BIOGEL PI INDICATOR 7.5 (GLOVE) ×8
GLOVE ECLIPSE 7.0 STRL STRAW (GLOVE) ×4 IMPLANT
GLOVE EXAM NITRILE XL STR (GLOVE) IMPLANT
GLOVE SURG SS PI 7.0 STRL IVOR (GLOVE) ×6 IMPLANT
GOWN STRL REUS W/ TWL LRG LVL3 (GOWN DISPOSABLE) ×2 IMPLANT
GOWN STRL REUS W/ TWL XL LVL3 (GOWN DISPOSABLE) IMPLANT
GOWN STRL REUS W/TWL 2XL LVL3 (GOWN DISPOSABLE) IMPLANT
GOWN STRL REUS W/TWL LRG LVL3 (GOWN DISPOSABLE) ×6
GOWN STRL REUS W/TWL XL LVL3 (GOWN DISPOSABLE) ×3
GRAFT DURAGEN MATRIX 5WX7L (Graft) ×2 IMPLANT
HEMOSTAT POWDER KIT SURGIFOAM (HEMOSTASIS) ×3 IMPLANT
HEMOSTAT SURGICEL 2X14 (HEMOSTASIS) ×2 IMPLANT
KIT BASIN OR (CUSTOM PROCEDURE TRAY) ×3 IMPLANT
KIT TURNOVER KIT B (KITS) ×3 IMPLANT
NEEDLE HYPO 22GX1.5 SAFETY (NEEDLE) ×3 IMPLANT
NS IRRIG 1000ML POUR BTL (IV SOLUTION) ×7 IMPLANT
OIL CARTRIDGE MAESTRO DRILL (MISCELLANEOUS) ×3
PACK CRANIOTOMY CUSTOM (CUSTOM PROCEDURE TRAY) ×3 IMPLANT
PAD DRESSING TELFA 3X8 NADH (GAUZE/BANDAGES/DRESSINGS) IMPLANT
PATTIES SURGICAL .5 X.5 (GAUZE/BANDAGES/DRESSINGS) IMPLANT
PATTIES SURGICAL .5 X3 (DISPOSABLE) IMPLANT
PATTIES SURGICAL 1X1 (DISPOSABLE) IMPLANT
SPONGE NEURO XRAY DETECT 1X3 (DISPOSABLE) IMPLANT
SPONGE SURGIFOAM ABS GEL 100 (HEMOSTASIS) ×3 IMPLANT
STAPLER VISISTAT 35W (STAPLE) ×3 IMPLANT
STOCKINETTE 6  STRL (DRAPES)
STOCKINETTE 6 STRL (DRAPES) ×1 IMPLANT
SUT ETHILON 3 0 FSL (SUTURE) IMPLANT
SUT ETHILON 3 0 PS 1 (SUTURE) IMPLANT
SUT NURALON 4 0 TR CR/8 (SUTURE) ×7 IMPLANT
SUT STEEL 0 (SUTURE)
SUT STEEL 0 18XMFL TIE 17 (SUTURE) IMPLANT
SUT VIC AB 0 CT1 18XCR BRD8 (SUTURE) ×2 IMPLANT
SUT VIC AB 0 CT1 8-18 (SUTURE) ×6
SUT VIC AB 3-0 SH 8-18 (SUTURE) ×8 IMPLANT
TAPE CLOTH 1X10 TAN NS (GAUZE/BANDAGES/DRESSINGS) ×1 IMPLANT
TOWEL GREEN STERILE (TOWEL DISPOSABLE) ×3 IMPLANT
TOWEL GREEN STERILE FF (TOWEL DISPOSABLE) ×3 IMPLANT
TRAY FOLEY MTR SLVR 16FR STAT (SET/KITS/TRAYS/PACK) ×3 IMPLANT
TUBE CONNECTING 12'X1/4 (SUCTIONS)
TUBE CONNECTING 12X1/4 (SUCTIONS) ×1 IMPLANT
UNDERPAD 30X36 HEAVY ABSORB (UNDERPADS AND DIAPERS) ×1 IMPLANT
WATER STERILE IRR 1000ML POUR (IV SOLUTION) ×3 IMPLANT

## 2020-07-08 NOTE — Progress Notes (Signed)
eLink Physician-Brief Progress Note Patient Name: Terri Oneal DOB: 02-21-1981 MRN: [id number]   Date of Service  07/04/2020  HPI/Events of Note  RN requests tylenol per tube for fever.  eICU Interventions  Tylenol liquid ordered per tube.     Intervention Category Minor Interventions: Routine modifications to care plan (e.g. PRN medications for pain, fever)  Terri Oneal 06/30/2020, 11:48 PM

## 2020-07-08 NOTE — Procedures (Signed)
Arterial Catheter Insertion Procedure Note  Terri Oneal  [id number]  August 03, 1980  Date:07/18/2020  Time:8:53 AM    Provider Performing: Tomma Lightning    Procedure: Insertion of Arterial Line (51700) with US guidance (17494)   Indication(s) Blood pressure monitoring and/or need for frequent ABGs  Consent Unable to obtain consent due to emergent nature of procedure. Requires blood pressure monitoring in the context of CVA, hypotension  Anesthesia None   Time Out Verified patient identification, verified procedure, site/side was marked, verified correct patient position, special equipment/implants available, medications/allergies/relevant history reviewed, required imaging and test results available.   Sterile Technique Maximal sterile technique including full sterile barrier drape, hand hygiene, sterile gown, sterile gloves, mask, hair covering, sterile ultrasound probe cover (if used).   Procedure Description Area of catheter insertion was cleaned with chlorhexidine and draped in sterile fashion. With real-time ultrasound guidance an arterial catheter was placed into the right radial artery.  Appropriate arterial tracings confirmed on monitor.     Complications/Tolerance None; patient tolerated the procedure well.   EBL Minimal   Specimen(s) None

## 2020-07-08 NOTE — Consult Note (Signed)
  Chief Complaint   Chief Complaint  Patient presents with  . Code Stroke    History of Present Illness  JAMAIA BRUM is a 39 y.o. female presenting after acute onset of left hemiplegia yesterday morning. She has a medical history significant for IV heroin use, hypothyroidism, and mitra prolapse. She was not a candidate for tPA nor mechanical thrombectomy. She has been noted by primary team to be progressively more somnolent and difficult to arouse this morning prompting neurosurgical consult for possible craniectomy.  Past Medical History   Past Medical History:  Diagnosis Date  . Anxiety and depression   . Hypothyroid   . Migraine   . MVP (mitral valve prolapse)   . Opioid abuse, in remission (HCC)   . Polysubstance abuse [facility])     Past Surgical History   Past Surgical History:  Procedure Laterality Date  . KNEE ARTHROPLASTY    . KNEE SURGERY    . MOUTH SURGERY      Social History   Social History   Tobacco Use  . Smoking status: Current Every Day Smoker    Packs/day: 1.00    Types: Cigarettes  . Smokeless tobacco: Never Used  Substance Use Topics  . Alcohol use: Yes  . Drug use: Yes    Types: Heroin, IV    Comment: heroin    Medications   Prior to Admission medications   Medication Sig Start Date End Date Taking? Authorizing Provider  ibuprofen (ADVIL) 200 MG tablet Take 600-800 mg by mouth every 6 (six) hours as needed for headache or mild pain.   Yes [provider]    Allergies  No Known Allergies  Review of Systems  ROS  Neurologic Exam  Somnolent, arouses to stimulus Can answer simple questions, drifts back to sleep Follows commands, good strength RUE/RLE Minimal spontaneous movement LLE, no movement flaccid LUE,   Imaging  CTH reviewed demonstrating large right MCA territory infarction. There is no significant mass effect at this time.  Impression  - 39 y.o. female with large right MCA territory infarction and progressively  declining LOC. I have discussed the situation with Dr. Roda Shutters and I believe with her young age, the size of the infarction, and her exam that she is at very high risk for developing severe intracranial hypertension. I therefore think operative decompression is reasonable at this point.  Plan  - Will proceed with right decompressive hemicraniectomy  I have reviewed the situation and imaging findings with the patient's mother at bedside. We discussed the details of surgery and likely postoperative course. Risks of the surgery were reviewed. All her questions were answered and she provided consent to proceed with surgery.   Lisbeth Renshaw, MD [facility]

## 2020-07-08 NOTE — Progress Notes (Signed)
eLink Physician-Brief Progress Note Patient Name: KEVIN MARIO DOB: 1981-06-26 MRN: [id number]   Date of Service  07/15/2020  HPI/Events of Note  Nursing request for A-line. Patient is a hard stick, Q 6 hour lab work.  eICU Interventions  Plan: 1. RT to place A-line.     Intervention Category Major Interventions: Other:  Lenell Antu 07/05/2020, 3:25 AM

## 2020-07-08 NOTE — Progress Notes (Signed)
Pt belongings upon admission include:  Purple tanktop, black and white pants, fuzzy slipper socks, one pair of stud earrings, gold heart necklace, navy backpack with assorted clothing, cellphone and charger. NO cash.

## 2020-07-08 NOTE — Progress Notes (Signed)
Repeat CT head reviewed. Per Radiology, no appreciable change. There are some new petechial blood products. Will need repeat CT head (which will be CT #3) at 10 AM (6 hours since CT #2). The 10 AM CT scan has been ordered. Pending results of repeat CT, may need a Neurosurgery consult for possible hemicraniectomy.   Electronically signed: Dr. Caryl Pina

## 2020-07-08 NOTE — Anesthesia Postprocedure Evaluation (Addendum)
Anesthesia Post Note  Patient: Terri Oneal  Procedure(s) Performed: RIGHT DECOMPRESSIVE HEMI CRANIECTOMY (N/A )     Patient location during evaluation: ICU Anesthesia Type: General Level of consciousness: sedated and patient remains intubated per anesthesia plan Pain management: pain level controlled Vital Signs Assessment: post-procedure vital signs reviewed and stable Respiratory status: patient remains intubated per anesthesia plan Cardiovascular status: stable and tachycardic Postop Assessment: no apparent nausea or vomiting Anesthetic complications: no   No complications documented.  Last Vitals:  Vitals:   07/14/2020 2000 07/06/2020 2100  BP: 113/60 117/60  Pulse: (!) 126 (!) 135  Resp: 16 16  Temp: 36.8 C   SpO2: 93% 90%    Last Pain:  Vitals:   07/20/2020 2000  TempSrc: Axillary  PainSc:                  Beryle Lathe

## 2020-07-08 NOTE — Progress Notes (Signed)
  Echocardiogram 2D Echocardiogram has been performed.  Augustine Radar 07/20/2020, 3:11 PM

## 2020-07-08 NOTE — Progress Notes (Signed)
Patient has noticeable belly distention with hypoactive belly sounds. Contacted E-Link with concern and told it could be possible anesthesia effect. Told to continue to monitor and report any new extended distention and tightness.  Benson Norway, RN

## 2020-07-08 NOTE — Progress Notes (Signed)
Consulted cardiology for vegetations on echocardiogram  Infective endocarditis

## 2020-07-08 NOTE — Progress Notes (Signed)
New blood leakage noticed around surgical incision site. Dressing reinforced, will continue to monitor.  Benson Norway, RN

## 2020-07-08 NOTE — Transfer of Care (Signed)
Immediate Anesthesia Transfer of Care Note  Patient: Terri Oneal  Procedure(s) Performed: RIGHT DECOMPRESSIVE HEMI CRANIECTOMY (N/A )  Patient Location: NICU  Anesthesia Type:General  Level of Consciousness: Patient remains intubated per anesthesia plan  Airway & Oxygen Therapy: Patient remains intubated per anesthesia plan and Patient placed on Ventilator (see vital sign flow sheet for setting)  Post-op Assessment: Report given to RN and Post -op Vital signs reviewed and stable  Post vital signs: Reviewed and stable  Last Vitals:  Vitals Value Taken Time  BP    Temp    Pulse 119 06/26/2020 1944  Resp 16 06/28/2020 1945  SpO2 91 % 06/25/2020 1944  Vitals shown include unvalidated device data.  Last Pain:  Vitals:   07/11/2020 1635  TempSrc: Axillary  PainSc:          Complications: No complications documented.

## 2020-07-08 NOTE — Progress Notes (Signed)
SLP Cancellation Note  Patient Details Name: Terri Oneal MRN: [id number] DOB: [birth year]   Cancelled treatment:        This morning pt was not adequately responsive after receiving Fentanyl this am. Physician in with pt presently and pt may be going to OR this afternoon. Will defer swallow eval for now.    Royce Macadamia July 27, 2020, 1:11 PM   Breck Coons Lonell Face.Ed Nurse, children's [phone] Office [phone]

## 2020-07-08 NOTE — Progress Notes (Signed)
OT Cancellation Note  Patient Details Name: MARISEL TOSTENSON MRN: [id number] DOB: 1980-10-19   Cancelled Treatment:    Reason Eval/Treat Not Completed: Patient not medically ready. Plan for repeat head CT this am, RN requesting OT hold. Will check back as medically appropriate.  Evern Bio Shyvonne Chastang 07/01/2020, 9:58 AM  Nyoka Cowden OTR/L Acute Rehabilitation Services Pager: [phone] Office: [phone]

## 2020-07-08 NOTE — Progress Notes (Signed)
PHARMACY - PHYSICIAN COMMUNICATION CRITICAL VALUE ALERT - BLOOD CULTURE IDENTIFICATION (BCID)  Terri Oneal is an 39 y.o. female who presented to [facility] on 2020/08/06 with a chief complaint of stroke with left hemiparesis and right gaze.   Assessment: 39 year old female with history of IVDU and prior endocarditis with mitral valve prolapse. Now with new stroke and multiple splenic infarcts. 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   Name of physician (or Provider) Contacted: ID team - Daiva Eves; 4N PharmD  Current antibiotics: Vanc/cefepime  Changes to prescribed antibiotics recommended:  Change to ampicillin/ceftriaxone to target enterococcal endocarditis   Results for orders placed or performed during the hospital encounter of Aug 06, 2020  Blood Culture ID Panel (Reflexed) (Collected: Aug 06, 2020 11:40 PM)  Result Value Ref Range   Enterococcus faecalis DETECTED (A) NOT DETECTED   Enterococcus Faecium NOT DETECTED NOT DETECTED   Listeria monocytogenes NOT DETECTED NOT DETECTED   Staphylococcus species NOT DETECTED NOT DETECTED   Staphylococcus aureus (BCID) NOT DETECTED NOT DETECTED   Staphylococcus epidermidis NOT DETECTED NOT DETECTED   Staphylococcus lugdunensis NOT DETECTED NOT DETECTED   Streptococcus species NOT DETECTED NOT DETECTED   Streptococcus agalactiae NOT DETECTED NOT DETECTED   Streptococcus pneumoniae NOT DETECTED NOT DETECTED   Streptococcus pyogenes NOT DETECTED NOT DETECTED   A.calcoaceticus-baumannii NOT DETECTED NOT DETECTED   Bacteroides fragilis NOT DETECTED NOT DETECTED   Enterobacterales NOT DETECTED NOT DETECTED   Enterobacter cloacae complex NOT DETECTED NOT DETECTED   Escherichia coli NOT DETECTED NOT DETECTED   Klebsiella aerogenes NOT DETECTED NOT DETECTED   Klebsiella oxytoca NOT DETECTED NOT DETECTED   Klebsiella pneumoniae NOT DETECTED NOT DETECTED   Proteus species NOT DETECTED NOT DETECTED   Salmonella species NOT DETECTED  NOT DETECTED   Serratia marcescens NOT DETECTED NOT DETECTED   Haemophilus influenzae NOT DETECTED NOT DETECTED   Neisseria meningitidis NOT DETECTED NOT DETECTED   Pseudomonas aeruginosa NOT DETECTED NOT DETECTED   Stenotrophomonas maltophilia NOT DETECTED NOT DETECTED   Candida albicans NOT DETECTED NOT DETECTED   Candida auris NOT DETECTED NOT DETECTED   Candida glabrata NOT DETECTED NOT DETECTED   Candida krusei NOT DETECTED NOT DETECTED   Candida parapsilosis NOT DETECTED NOT DETECTED   Candida tropicalis NOT DETECTED NOT DETECTED   Cryptococcus neoformans/gattii NOT DETECTED NOT DETECTED   Vancomycin resistance NOT DETECTED NOT DETECTED    Sharin Mons, PharmD, BCPS, BCIDP Infectious Diseases Clinical Pharmacist Phone: [phone] 07/02/2020  2:06 PM

## 2020-07-08 NOTE — Progress Notes (Signed)
CRITICAL VALUE ALERT  Critical Value:  6.5  Date & Time Notied:  12/15 10:40  Provider Notified: Nedra Hai, MD  Orders Received/Actions taken: 1U PRBC ordered

## 2020-07-08 NOTE — Progress Notes (Signed)
PT Cancellation Note  Patient Details Name: Terri Oneal MRN: [id number] DOB: Oct 30, 1980   Cancelled Treatment:    Reason Eval/Treat Not Completed: Medical issues which prohibited therapy - plan for repeat head CT this am, RN requesting PT hold. Will check back as medically appropriate.  Marye Round, PT Acute Rehabilitation Services Pager [phone]  Office [phone]    [name] 2020/07/18, 9:22 AM

## 2020-07-08 NOTE — ED Provider Notes (Addendum)
[facility] EMERGENCY DEPARTMENT Provider Note   CSN: [id number] Arrival date & time: 2020-07-20  2124  An emergency department physician performed an initial assessment on this suspected stroke patient at 2124.  History Chief Complaint  Patient presents with   Code Stroke    Terri Oneal is a 39 y.o. female with history of polysubstance abuse including IV heroin and methamphetamine, hypothyroidism, MVP presents to ER by EMS as code stroke.  Limited history due to acuity of condition. Level 5 caveat. Per triage note boyfriend found patient at 1500 today on the floor, called 911.  Per EMS had left facial droop, left weakness and neglect. By the time I evaluated patient she was in CT scan, neurology at bedside. Patient reports abdominal pain for the last week, constant, severe worse on the right mid abdomen. Some nausea, no vomiting. Had some constipation but did an enema and had a BM yesterday. Denies fevers but states she feels really cold right now. No cough. Reports shortness of breath from a "heart condition" elaborates that she has mitral valve prolapse and is always short of breath, feels like this has worsened lately. No CP. No cough. States his boyfriend had an infection in his "spinal cord" and worries that she got that infection too. No dysuria. No diarrhea. Does not think she has any abscesses or cellulitis from IVDU. No headache. No neck pain.   HPI     Past Medical History:  Diagnosis Date   Anxiety and depression    Hypothyroid    Migraine    MVP (mitral valve prolapse)    Opioid abuse, in remission (HCC)    Polysubstance abuse [facility])     Patient Active Problem List   Diagnosis Date Noted   Acute stroke due to occlusion of right middle cerebral artery (HCC) 07-20-20   Opioid abuse with opioid-induced mood disorder (HCC) 03/29/2017   Opioid withdrawal (HCC) 03/29/2017   Hypothyroid 03/29/2017   Tobacco abuse 03/29/2017    Past  Surgical History:  Procedure Laterality Date   KNEE ARTHROPLASTY     KNEE SURGERY     MOUTH SURGERY       OB History    Gravida  3   Para      Term      Preterm      AB  2   Living        SAB  2   IAB      Ectopic      Multiple      Live Births           Obstetric Comments  Early SAB x 2 (last one was approx 2016)        Family History  Problem Relation Age of Onset   Diabetes Other    Hypertension Other    Thyroid disease Other     Social History   Tobacco Use   Smoking status: Current Every Day Smoker    Packs/day: 1.00    Types: Cigarettes   Smokeless tobacco: Never Used  Substance Use Topics   Alcohol use: Yes   Drug use: Yes    Types: Heroin, IV    Comment: heroin    Home Medications Prior to Admission medications   Medication Sig Start Date End Date Taking? Authorizing Provider  ibuprofen (ADVIL) 200 MG tablet Take 600-800 mg by mouth every 6 (six) hours as needed for headache or mild pain.   Yes [provider]  Allergies    Patient has no known allergies.  Review of Systems   Review of Systems  Constitutional: Positive for chills.  Respiratory: Positive for shortness of breath.   Gastrointestinal: Positive for abdominal pain, constipation (resolved) and nausea.  Neurological: Positive for numbness.  All other systems reviewed and are negative.   Physical Exam Updated Vital Signs BP (!) 109/46    Pulse (!) 118    Temp 97.6 F (36.4 C) (Temporal)    Resp (!) 29    SpO2 97%   Physical Exam Vitals and nursing note reviewed.  Constitutional:      Appearance: She is well-developed and well-nourished. She is ill-appearing.     Comments: NAD.  HENT:     Head: Normocephalic.     Comments: No scalp or facial bone tenderness     Right Ear: External ear normal.     Left Ear: External ear normal.     Nose: Nose normal.  Eyes:     General: No scleral icterus.    Extraocular Movements: EOM normal.      Conjunctiva/sclera: Conjunctivae normal.     Comments: Pupils 2-3 mm, round, reactive. Eyes do not cross midline to left.   Neck:     Comments: No cervical collar in place. No neck tenderness, rigidity. Full ROM Cardiovascular:     Rate and Rhythm: Regular rhythm. Tachycardia present.     Heart sounds: Normal heart sounds. No murmur heard.     Comments: HR in 120s. No LE edema or calf tenderness  Pulmonary:     Effort: Pulmonary effort is normal.     Breath sounds: No wheezing.     Comments: Normal work of breathing, speaking in full sentences. Diminished lung sounds in bilateral bases.  Abdominal:     Tenderness: There is abdominal tenderness. There is right CVA tenderness and guarding.     Comments: Diffuse abdominal tenderness worse in RUQ, right mid abdomen and RLQ. R CVAT. Some firmness but no distention, guarding.   Musculoskeletal:        General: No deformity. Normal range of motion.     Cervical back: Normal range of motion and neck supple.  Skin:    General: Skin is warm and dry.     Capillary Refill: Capillary refill takes less than 2 seconds.     Comments: Several ecchymotic area in LE bilaterally   Neurological:     Mental Status: She is alert.     Cranial Nerves: Cranial nerve deficit present.     Sensory: Sensory deficit present.     Motor: Weakness present.     Comments: Left side facial droop. Patient with left facial, upper/lower extremity neglect. Dysarthric speech. Normal strength, sensation and movement in right face, upper/lower extremities.   Psychiatric:        Mood and Affect: Mood and affect and mood normal.     ED Results / Procedures / Treatments   Labs (all labs ordered are listed, but only abnormal results are displayed) Labs Reviewed  PROTIME-INR - Abnormal; Notable for the following components:      Result Value   Prothrombin Time 16.5 (*)    INR 1.4 (*)    All other components within normal limits  APTT - Abnormal; Notable for the following  components:   aPTT 37 (*)    All other components within normal limits  CBC - Abnormal; Notable for the following components:   WBC 19.9 (*)    RBC 2.82 (*)  Hemoglobin 8.0 (*)    HCT 26.9 (*)    MCHC 29.7 (*)    RDW 22.1 (*)    Platelets 103 (*)    All other components within normal limits  DIFFERENTIAL - Abnormal; Notable for the following components:   Neutro Abs 17.7 (*)    Abs Immature Granulocytes 0.32 (*)    All other components within normal limits  COMPREHENSIVE METABOLIC PANEL - Abnormal; Notable for the following components:   Sodium 132 (*)    CO2 20 (*)    Glucose, Bld 111 (*)    Calcium 7.9 (*)    Total Protein 6.2 (*)    Albumin 1.9 (*)    AST 42 (*)    Alkaline Phosphatase 157 (*)    Total Bilirubin 3.5 (*)    All other components within normal limits  SODIUM - Abnormal; Notable for the following components:   Sodium 130 (*)    All other components within normal limits  LACTIC ACID, PLASMA - Abnormal; Notable for the following components:   Lactic Acid, Venous 3.2 (*)    All other components within normal limits  I-STAT CHEM 8, ED - Abnormal; Notable for the following components:   Sodium 132 (*)    BUN 22 (*)    Glucose, Bld 108 (*)    Calcium, Ion 1.07 (*)    Hemoglobin 8.2 (*)    HCT 24.0 (*)    All other components within normal limits  RESP PANEL BY RT-PCR (FLU A&B, COVID) ARPGX2  CULTURE, BLOOD (SINGLE)  CULTURE, BLOOD (ROUTINE X 2)  CULTURE, BLOOD (ROUTINE X 2)  ETHANOL  SODIUM  RAPID URINE DRUG SCREEN, HOSP PERFORMED  LACTIC ACID, PLASMA  SODIUM  SODIUM  HIV ANTIBODY (ROUTINE TESTING W REFLEX)  CBC  CREATININE, SERUM  CBC  BASIC METABOLIC PANEL  I-STAT BETA HCG BLOOD, ED ([facility], [facility], [facility] ONLY)  CBG MONITORING, ED    EKG EKG Interpretation  Date/Time:  Tuesday Jul 16, 2020 22:35:15 EST Ventricular Rate:  120 PR Interval:    QRS Duration: 76 QT Interval:  337 QTC Calculation: 477 R Axis:   94 Text Interpretation: Sinus  tachycardia Borderline right axis deviation Consider left ventricular hypertrophy No old tracing to compare Confirmed by Linwood Dibbles [id number]) on 07/16/20 10:56:34 PM   Radiology CT ABDOMEN PELVIS WO CONTRAST  Result Date: 07-16-2020 CLINICAL DATA:  Leukocytosis and abdominal pain. Stroke. EXAM: CT ABDOMEN AND PELVIS WITHOUT CONTRAST TECHNIQUE: Multidetector CT imaging of the abdomen and pelvis was performed following the standard protocol without IV contrast. COMPARISON:  None. FINDINGS: Lower chest: Right pleural effusion. There is basilar septal thickening. Patchy and ground-glass opacities in both lung bases, also confluent dependently. Hepatobiliary: No evidence of focal hepatic lesion, evaluation limited by lack of IV contrast. There are hyperdense blood products in the right pericolic gutter abutting the liver tip. Gallbladder appears present, no calcified gallstone. Pancreas: Grossly negative. Spleen: Enlarged spanning 13 x 7.3 x 12.7 cm. Multiple areas of low-density within the spleen, including dominant cystic structure superiorly measuring 8.3 cm. There are also wedge-shaped and peripheral defects inferiorly. There may be trace perisplenic fluid. Adrenals/Urinary Tract: No adrenal nodule. There is excreted IV contrast within both renal collecting systems from prior neck CTA. Nephrograms are slightly striated. There is excreted IV contrast in the urinary bladder. Stomach/Bowel: Bowel evaluation is limited in the absence of contrast, paucity of intra-abdominal fat, generalized edema of abdominal fat. Stomach is unremarkable. There is no evidence of obstruction.  Appendix is not readily identified. Small volume of stool in the proximal colon. Distal colon is decompressed. Vascular/Lymphatic: Normal caliber abdominal aorta. Limited assessment for adenopathy on the current exam. Reproductive: The uterus appears present. There is suggestion of an ovoid high density structure in the right adnexa spanning  5.1 cm, pelvic free fluid appears complex. Streak artifact from dense IV contrast in the bladder obscures detailed assessment. Other: There is generalized edema of the subcutaneous and intra-abdominal fat. Complex free fluid in the right pericolic gutter, as well as in the pelvis. Lesser complex free fluid in the mesentery and possibly left pericolic gutter. Etiology is uncertain. No free air. Musculoskeletal: There are no acute or suspicious osseous abnormalities. IMPRESSION: 1. Complex free fluid in the right pericolic gutter, as well as in the pelvis, suspicious for hemoperitoneum. Etiology is uncertain, however there is suggestion of an ovoid high density structure in the right adnexa spanning 5.1 cm, that may represent a hemorrhagic cyst. 2. Splenomegaly. Multiple areas of low-density within the spleen suspicious for infarcts. There is a dominant cystic structure superiorly spanning 8.3 cm, indeterminate for additional infarct, cyst, or abscess. 3. Right pleural effusion. Patchy and ground-glass opacities in both lung bases, also confluent dependently. Findings may be secondary to pulmonary edema, pneumonia, or combination thereof. 4. Generalized edema of the subcutaneous and intra-abdominal fat. 5. Contrast within both renal collecting systems from prior neck CTA. There is suggestion of mild striated nephrograms, which can be seen with urinary tract infection. 6. Patient may benefit from contrast enhanced exam to further delineate. If she is hemodynamically stable, recommend trending of renal function, and follow-up CT could be performed in the next day. These results were called by telephone at the time of interpretation on 07/18/2020 at 11:28 pm to provider Carolan Avedisian , who verbally acknowledged these results. Electronically Signed   By: Narda Rutherford M.D.   On: 07/04/2020 23:28   CT Code Stroke CTA Head W/WO contrast  Result Date: 07/09/2020 CLINICAL DATA:  Left facial droop.  Right MCA  infarct. EXAM: CT ANGIOGRAPHY HEAD AND NECK TECHNIQUE: Multidetector CT imaging of the head and neck was performed using the standard protocol during bolus administration of intravenous contrast. Multiplanar CT image reconstructions and MIPs were obtained to evaluate the vascular anatomy. Carotid stenosis measurements (when applicable) are obtained utilizing NASCET criteria, using the distal internal carotid diameter as the denominator. CONTRAST:  75mL OMNIPAQUE IOHEXOL 350 MG/ML SOLN COMPARISON:  None. FINDINGS: CTA NECK FINDINGS SKELETON: There is no bony spinal canal stenosis. No lytic or blastic lesion. OTHER NECK: Normal pharynx, larynx and major salivary glands. No cervical lymphadenopathy. Unremarkable thyroid gland. UPPER CHEST: Small pleural effusions with areas of right apical consolidation. AORTIC ARCH: There is no calcific atherosclerosis of the aortic arch. There is no aneurysm, dissection or hemodynamically significant stenosis of the visualized portion of the aorta. Conventional 3 vessel aortic branching pattern. The visualized proximal subclavian arteries are widely patent. RIGHT CAROTID SYSTEM: Normal without aneurysm, dissection or stenosis. LEFT CAROTID SYSTEM: Normal without aneurysm, dissection or stenosis. VERTEBRAL ARTERIES: Left dominant configuration. Both origins are clearly patent. There is no dissection, occlusion or flow-limiting stenosis to the skull base (V1-V3 segments). CTA HEAD FINDINGS POSTERIOR CIRCULATION: --Vertebral arteries: Normal V4 segments. --Inferior cerebellar arteries: Normal. --Basilar artery: Normal. --Superior cerebellar arteries: Normal. --Posterior cerebral arteries (PCA): Normal. ANTERIOR CIRCULATION: --Intracranial internal carotid arteries: Normal. --Anterior cerebral arteries (ACA): Normal. Both A1 segments are present. Patent anterior communicating artery (a-comm). --Middle cerebral arteries (MCA): Proximal  right M1 segment is occluded. The more distal M1  segment opacifies normally. There is limited collateralization within the anterior right MCA territory. M2 branches are otherwise predominantly patent. VENOUS SINUSES: As permitted by contrast timing, patent. ANATOMIC VARIANTS: Both P comms are present. Review of the MIP images confirms the above findings. IMPRESSION: 1. Occlusion of the proximal right M1 segment with limited collateralization within the anterior right MCA territory. 2. Large right MCA territory infarct with previously reported ASPECTS of 0. 3. Small pleural effusions with areas of right apical consolidation. These results were communicated to Dr. Caryl Pina at 10:42 pm on 07/05/2020 by text page via the [facility] messaging system. Electronically Signed   By: Deatra Robinson M.D.   On: 07/04/2020 22:44   CT Code Stroke CTA Neck W/WO contrast  Result Date: 07/12/2020 CLINICAL DATA:  Left facial droop.  Right MCA infarct. EXAM: CT ANGIOGRAPHY HEAD AND NECK TECHNIQUE: Multidetector CT imaging of the head and neck was performed using the standard protocol during bolus administration of intravenous contrast. Multiplanar CT image reconstructions and MIPs were obtained to evaluate the vascular anatomy. Carotid stenosis measurements (when applicable) are obtained utilizing NASCET criteria, using the distal internal carotid diameter as the denominator. CONTRAST:  75mL OMNIPAQUE IOHEXOL 350 MG/ML SOLN COMPARISON:  None. FINDINGS: CTA NECK FINDINGS SKELETON: There is no bony spinal canal stenosis. No lytic or blastic lesion. OTHER NECK: Normal pharynx, larynx and major salivary glands. No cervical lymphadenopathy. Unremarkable thyroid gland. UPPER CHEST: Small pleural effusions with areas of right apical consolidation. AORTIC ARCH: There is no calcific atherosclerosis of the aortic arch. There is no aneurysm, dissection or hemodynamically significant stenosis of the visualized portion of the aorta. Conventional 3 vessel aortic branching pattern. The  visualized proximal subclavian arteries are widely patent. RIGHT CAROTID SYSTEM: Normal without aneurysm, dissection or stenosis. LEFT CAROTID SYSTEM: Normal without aneurysm, dissection or stenosis. VERTEBRAL ARTERIES: Left dominant configuration. Both origins are clearly patent. There is no dissection, occlusion or flow-limiting stenosis to the skull base (V1-V3 segments). CTA HEAD FINDINGS POSTERIOR CIRCULATION: --Vertebral arteries: Normal V4 segments. --Inferior cerebellar arteries: Normal. --Basilar artery: Normal. --Superior cerebellar arteries: Normal. --Posterior cerebral arteries (PCA): Normal. ANTERIOR CIRCULATION: --Intracranial internal carotid arteries: Normal. --Anterior cerebral arteries (ACA): Normal. Both A1 segments are present. Patent anterior communicating artery (a-comm). --Middle cerebral arteries (MCA): Proximal right M1 segment is occluded. The more distal M1 segment opacifies normally. There is limited collateralization within the anterior right MCA territory. M2 branches are otherwise predominantly patent. VENOUS SINUSES: As permitted by contrast timing, patent. ANATOMIC VARIANTS: Both P comms are present. Review of the MIP images confirms the above findings. IMPRESSION: 1. Occlusion of the proximal right M1 segment with limited collateralization within the anterior right MCA territory. 2. Large right MCA territory infarct with previously reported ASPECTS of 0. 3. Small pleural effusions with areas of right apical consolidation. These results were communicated to Dr. Caryl Pina at 10:42 pm on 07/06/2020 by text page via the [facility] messaging system. Electronically Signed   By: Deatra Robinson M.D.   On: 07/19/2020 22:44   CT CEREBRAL PERFUSION W CONTRAST  Addendum Date: 07/04/2020   ADDENDUM REPORT: 07/10/2020 00:10 ADDENDUM: The above numbers refer strictly to the computed data. Given the large amount of hypoattenuation demonstrated on the pre contrast head CT, the degree of core  infarct is likely underestimated by the RAPID software. This is most evident at the supraganglionic level and in the anterior ganglionic level zones. However, it is not  possible to accurately quantify the degree to which the calculated core infarct is underestimated. The calculated area of Tmax>6.0s appears to more closely correspond to the hypodense region on the earlier noncontrast CT than does the area of CBF<30%. I suspect the greatest area of penumbra is in the posterior right MCA territory. These findings were discussed with Dr. Otelia Limes at 12:01 AM on 07/05/2020. Electronically Signed   By: Deatra Robinson M.D.   On: 07/20/2020 00:10   Result Date: 07/04/2020 CLINICAL DATA:  Right MCA stroke EXAM: CT PERFUSION BRAIN TECHNIQUE: Multiphase CT imaging of the brain was performed following IV bolus contrast injection. Subsequent parametric perfusion maps were calculated using RAPID software. CONTRAST:  86mL OMNIPAQUE IOHEXOL 350 MG/ML SOLN COMPARISON:  None. FINDINGS: CT Brain Perfusion Findings: CBF (<30%) Volume: 61mL Perfusion (Tmax>6.0s) volume: Mismatch Volume: ASPECTS on noncontrast CT Head: 0 at 9:38 p.m. today. Infarct Core: 35 mL Infarction Location:Right MCA territory IMPRESSION: 35 mL core infarct in the right MCA territory with 110 mL surrounding ischemic penumbra. Electronically Signed: By: Deatra Robinson M.D. On: 07/27/2020 23:32   DG Chest Portable 1 View  Result Date: 07/27/2020 CLINICAL DATA:  39 year old female with leukocytosis. EXAM: PORTABLE CHEST 1 VIEW COMPARISON:  Chest radiograph dated 03/25/2006. FINDINGS: There is cardiomegaly with diffuse interstitial and interlobular septal prominence and Kerley B-lines most consistent with edema. Pneumonia is not excluded clinical correlation is recommended. There is a small left pleural effusion. No pneumothorax. No acute osseous pathology. IMPRESSION: Cardiomegaly with findings of CHF. Pneumonia is not excluded. Electronically Signed    By: Elgie Collard M.D.   On: 2020-07-27 23:39   CT HEAD CODE STROKE WO CONTRAST  Result Date: 07-27-20 CLINICAL DATA:  Code stroke.  Neuro deficit, acute, stroke suspected EXAM: CT HEAD WITHOUT CONTRAST TECHNIQUE: Contiguous axial images were obtained from the base of the skull through the vertex without intravenous contrast. COMPARISON:  05/16/2010. FINDINGS: Brain: No intracranial hemorrhage. Acute right MCA territory insult with blurring of the gray-white junctions and cerebral edema. Partial effacement of the right lateral ventricle. No mass lesion. No midline shift, ventriculomegaly or extra-axial fluid collection. Vascular: No hyperdense vessel or unexpected calcification. Skull: Negative for fracture or focal lesion. Sinuses/Orbits: No acute orbital finding. Layering sphenoid sinus secretions. Mild ethmoid sinus mucosal thickening. Pneumatized mastoid air cells. Other: None. ASPECTS [facility] Stroke Program Early CT Score) - Ganglionic level infarction (caudate, lentiform nuclei, internal capsule, insula, M1-M3 cortex): 0 - Supraganglionic infarction (M4-M6 cortex): 0 Total score (0-10 with 10 being normal): 0 IMPRESSION: 1. Acute/subacute right MCA territory infarct. No intracranial hemorrhage. 2. ASPECTS is 0 Code stroke imaging results were communicated on 07/27/20 at 9:45 pm to provider Dr. Otelia Limes via secure text paging. Electronically Signed   By: Stana Bunting M.D.   On: 07/27/20 21:49    Procedures Ultrasound ED Peripheral IV (Provider)  Date/Time: 07/04/2020 12:05 AM Performed by: Liberty Handy, PA-C Authorized by: Liberty Handy, PA-C   Procedure details:    Indications: hydration, multiple failed IV attempts and poor IV access     Skin Prep: chlorhexidine gluconate     Location: right upper arm.   Angiocath:  18 G   Bedside Ultrasound Guided: Yes     Images: not archived     Patient tolerated procedure without complications: Yes     Dressing applied:  Yes   .Critical Care Performed by: Liberty Handy, PA-C Authorized by: Liberty Handy, PA-C   Critical care provider statement:  Critical care time (minutes):  45   Critical care was necessary to treat or prevent imminent or life-threatening deterioration of the following conditions:  Sepsis and CNS failure or compromise   Critical care was time spent personally by me on the following activities:  Discussions with consultants, evaluation of patient's response to treatment, examination of patient, ordering and performing treatments and interventions, ordering and review of laboratory studies, ordering and review of radiographic studies, pulse oximetry, re-evaluation of patient's condition, obtaining history from patient or surrogate, review of old charts and development of treatment plan with patient or surrogate   I assumed direction of critical care for this patient from another provider in my specialty: no     (including critical care time)  Medications Ordered in ED Medications  sodium chloride flush (NS) 0.9 % injection 3 mL (3 mLs Intravenous Not Given 07-23-2020 2246)  sodium chloride (hypertonic) 3 % solution ( Intravenous New Bag/Given 07/05/2020 0005)  aspirin suppository 300 mg (300 mg Rectal Given 07-23-20 2345)  lactated ringers bolus 500 mL (500 mLs Intravenous New Bag/Given July 23, 2020 2344)  vancomycin (VANCOREADY) IVPB 1250 mg/250 mL (has no administration in time range)  vancomycin (VANCOCIN) IVPB 1000 mg/200 mL premix (has no administration in time range)  piperacillin-tazobactam (ZOSYN) IVPB 3.375 g (has no administration in time range)  heparin injection 5,000 Units (has no administration in time range)  ondansetron (ZOFRAN) injection 4 mg (has no administration in time range)  0.9 %  sodium chloride infusion (has no administration in time range)  sodium chloride flush (NS) 0.9 % injection 10-40 mL (has no administration in time range)  sodium chloride flush (NS) 0.9  % injection 10-40 mL (has no administration in time range)  iohexol (OMNIPAQUE) 350 MG/ML injection 75 mL (75 mLs Intravenous Contrast Given 2020-07-23 2222)  iohexol (OMNIPAQUE) 350 MG/ML injection 50 mL (50 mLs Intravenous Contrast Given 07/23/20 2316)  piperacillin-tazobactam (ZOSYN) IVPB 3.375 g (3.375 g Intravenous New Bag/Given July 23, 2020 2344)    ED Course  I have reviewed the triage vital signs and the nursing notes.  Pertinent labs & imaging results that were available during my care of the patient were reviewed by me and considered in my medical decision making (see chart for details).  Clinical Course as of 07/06/2020 0029  Tue 07/23/2020  2224 Admit to ICU Concern for FB injection, emboli Tx hypertonix NS Possible endovascular interventions BP goal 130-160 Concern for infectious source [CG]  2300 EKG 12-Lead Sinus tachycardia Borderline right axis deviation Consider left ventricular hypertrophy No old tracing to compare Confirmed by Linwood Dibbles [phone]) on 07/23/20 10:56:34 PM [CG]  2324 High density fluid right abdomen concern for blood, unknown source. One possibility hemorrhagic cyst right ovary. Limited exam. Tracks up to RUQ and liver. Spleen multiple hypodense areas likely infarct and maybe cyst vs abscess. Edematous all fat. Small pleural effusions, pulmonary edema at lung bases.  [CG]  Wed Jul 08, 2020  0004 DG Chest Portable 1 View IMPRESSION: Cardiomegaly with findings of CHF. Pneumonia is not excluded.   [CG]    Clinical Course User Index [CG] Terri Oneal   MDM Rules/Calculators/A&P                          39 year old female with history of polysubstance abuse including IV drug use presents to the ED as code stroke.  Last seen normal 9 AM today.  Found on the ground by boyfriend at 1300.  Patient reports 1 week of mostly right-sided abdominal pain, nausea.  No urinary symptoms.  On exam she is got several areas of ecchymosis in lower  extremities.  Concern for trauma/fall.  At the time of my evaluation patient was in the CT scanner, neurologist Dr. Otelia Limes at bedside.  Scan confirms large right MCA stroke.  Neuro imaging ordered by neurology including CT perfusion, CTA of head and neck.  Lab work, imaging ordered as above.  ER work-up personally visualized and interpreted  Lab - WBC 19.9, lactic acidosis to 8.2.  HR in 120s.  She meets SIRS criteria.  Source unknown at this time.  Rectal temp is 99.3.  Soft blood pressures but maps greater than 65.  No evidence of septic shock at this time.  Broad-spectrum antibiotics ordered.  Given map will defer large IV fluid bolus.  500 cc LR ordered.  Hemoglobin 8, last 11 two years ago. May be related to CT AP findings.   CTA/P without contrast obtain for abdominal pain.  By the time she reported abdominal pain to me she had had significant amount of contrast for neuro imaging.  CTAP reveals large amount of fluid in the right upper quadrant concerning for blood/hemoperiteum, source is unclear at this time.  Patient was found on floor, ?trauma. Several hypodense areas in the spleen concerning for infarct and possible cyst versus abscess.  Small pleural effusion and concern for pulmonary edema as well.  Radiologist recommends CTAP with contrast once more stable.   Ddx includes endocarditis, septic emboli. Anemia may be related to hemoperitoneum?   Discussed with PCCM Dr Denese Killings who will admit patient. Shared with EDP Lynelle Doctor.  Final Clinical Impression(s) / ED Diagnoses Final diagnoses:  Acute right MCA stroke (HCC)  SIRS (systemic inflammatory response syndrome) [facility])    Rx / DC Orders ED Discharge Orders    None       Liberty Handy, PA-C Aug 07, 2020 0027    Liberty Handy, PA-C 2020-08-07 [id number]    Linwood Dibbles, MD August 07, 2020 [phone]

## 2020-07-08 NOTE — Progress Notes (Signed)
NAME:  Terri Oneal, MRN:  [id number], DOB:  November 20, 1980, LOS: 1 ADMISSION DATE:  07/17/2020, CONSULTATION DATE:  07/14/2020 REFERRING MD: Dr.Lindzen CHIEF COMPLAINT: left side weakness  Brief History   39 yo F w/ PMH of IVDU presenting with L hemiparesis and R gaze. Head CT showing large MCA infarct with midline shift  History of present illness   Terri Oneal is a 39 yo F w/ PMH of polysubstance abuse including IV heroin, prior endocarditis w/ mitral valve prolapse, h ypothyroidism who was  In her usual state of health until 9am yesterday. She was found down at home by her boyrfriend around 3pm in the afternoon with Left sided weakness. EMS was called ans she was brough to [facility] for evaluation. She was found to have rM1 occlusion with large MCA infarct on stat CT during code strokes. She was also found to have R paracloic gutter fluid and splenic infarct on CT abd/pelvis  PCCM consulted for ICU admission for further work up and management including hypertonic saline administration  Past Medical History  Polysubstance use Prior endocarditis w/ mitral valve prolapse Hypothyroidism Anxiety/depression  Significant Hospital Events   12/14 admit  Consults:  Neurology  Procedures:  12/15 central line insertion  Significant Diagnostic Tests:   CT angio head and neck 12/14 > occlusion of the proximal right M1 and collaterals resulting in large right MCA infarct  CT abdomen pelvis 12/14 > CT abdomen and pelvis which revealed complex free fluid in the right paracolic gutter suspicious for hemoperitoneum as well as splenomegaly with likely several areas of splenic infarcts  Cerebral perfusion study 12/14 > 35 mL core infarct in the right MCA territory with 110 mL surrounding ischemic penumbra.   Micro Data:  12/14 COVID neg 12/14 Blood culture >>  Antimicrobials:  12/14 vanc, zosyn  Interim history/subjective:  O/N event: Repeat CT w/ petechial bleed    Objective   Blood  pressure (!) 89/45, pulse (!) 121, temperature 99.9 F (37.7 C), temperature source Axillary, resp. rate (!) 28, SpO2 97 %.        Intake/Output Summary (Last 24 hours) at 07/24/2020 0707 Last data filed at 07/05/2020 0600 Gross per 24 hour  Intake 293.77 ml  Output 300 ml  Net -6.23 ml   There were no vitals filed for this visit.  Examination:  Resolved Hospital Problem list     Assessment & Plan:   Sepsis Noted to have leukocytosis, hypotension, tachycardia, tachypnea. Has hx of sepsis with endocarditis due to drug use. Concerning for sepsis. Blood culture pending. Started on vanc/cefepime in ED. Am bp 89/45. Worsening mentation overnight but did received Ativan. High risk for decompensation. May need intubation - May need pressors, central line in place MAP goal >65 - LR bolus if bp drops - F/u blood culture - C/w vanc / cefepime - ABG  Large acute/subacute right MCA worked Present w/ L hemiplegia. Outside tpa window. Not thrombectomy candidate. Neuro on board. Recommend repeat CT at 10am. Possibly neurosurgery consult if worsening midline shift. Started 3% saline  - Appreciate neuro recs - C/w hypertonic saline administration - Aspiration/seizure precautions - Neurochecks  Multiple abnormalities seen on abdominal CT scan including: Splenic infarcts Complex free fluid  Possibly due to ovarian cyst rupture. Would benefit from contrasted imaging but will monitor for now on antibiotics - C/w broad spectrum IV - F/u urine culture  Polysubstance abuse Patient reported use of IV heroin 1 day prior to admission - Monitor for withdrawal sxs -  Need to quit     Best practice (evaluated daily)   Diet: NPO Pain/Anxiety/Delirium protocol (if indicated): fentanyl VAP protocol (if indicated): Y DVT prophylaxis: scd GI prophylaxis: PPI Glucose control: N/A Mobility: BR last date of multidisciplinary goals of care discussion 06/29/2020 Family and staff present N Summary  of discussion N/A Follow up goals of care discussion due 07-16-20 Code Status: Full Disposition: ICU  Labs   CBC: Recent Labs  Lab 07/14/2020 2136 07/15/2020 2210 06/24/2020 2342  WBC 19.9*  --  16.6*  NEUTROABS 17.7*  --   --   HGB 8.0* 8.2* 8.6*  HCT 26.9* 24.0* 28.6*  MCV 95.4  --  94.1  PLT 103*  --  83*    Basic Metabolic Panel: Recent Labs  Lab 07/18/2020 2136 07/05/2020 2210 07/16/2020 2222 07/02/2020 2342  NA 132* 132* 130*  --   K 4.1 4.1  --   --   CL 99 100  --   --   CO2 20*  --   --   --   GLUCOSE 111* 108*  --   --   BUN 20 22*  --   --   CREATININE 0.97 0.80  --  0.75  CALCIUM 7.9*  --   --   --    GFR: CrCl cannot be calculated (Unknown ideal weight.). Recent Labs  Lab 07/12/2020 2136 07/14/2020 2239 07/09/2020 2342  WBC 19.9*  --  16.6*  LATICACIDVEN  --  3.2*  --     Liver Function Tests: Recent Labs  Lab 07/03/2020 2136  AST 42*  ALT 18  ALKPHOS 157*  BILITOT 3.5*  PROT 6.2*  ALBUMIN 1.9*   No results for input(s): LIPASE, AMYLASE in the last 168 hours. No results for input(s): AMMONIA in the last 168 hours.  ABG    Component Value Date/Time   TCO2 23 07/12/2020 2210     Coagulation Profile: Recent Labs  Lab 06/25/2020 2136  INR 1.4*    Cardiac Enzymes: No results for input(s): CKTOTAL, CKMB, CKMBINDEX, TROPONINI in the last 168 hours.  HbA1C: No results found for: HGBA1C  CBG: No results for input(s): GLUCAP in the last 168 hours.  Review of Systems:   Unable to assess  Past Medical History  She,  has a past medical history of Anxiety and depression, Hypothyroid, Migraine, MVP (mitral valve prolapse), Opioid abuse, in remission (HCC), and Polysubstance abuse (HCC).   Surgical History    Past Surgical History:  Procedure Laterality Date  . KNEE ARTHROPLASTY    . KNEE SURGERY    . MOUTH SURGERY       Social History   reports that she has been smoking cigarettes. She has been smoking about 1.00 pack per day. She has never  used smokeless tobacco. She reports current alcohol use. She reports current drug use. Drugs: Heroin and IV.   Family History   Her family history includes Diabetes in an other family member; Hypertension in an other family member; Thyroid disease in an other family member.   Allergies No Known Allergies   Home Medications  Prior to Admission medications   Medication Sig Start Date End Date Taking? Authorizing Provider  ibuprofen (ADVIL) 200 MG tablet Take 600-800 mg by mouth every 6 (six) hours as needed for headache or mild pain.   Yes [provider]    Theotis Barrio, MD 2020-07-16, 9:00 AM PGY-3, [facility] Internal Medicine Pager: [phone]  Critical care time:

## 2020-07-08 NOTE — Progress Notes (Signed)
STROKE TEAM PROGRESS NOTE   INTERVAL HISTORY Her fiance is at the bedside. I also talked with mom over the phone. Pt received ativan 5am for CT head which showed large right MCA infarct with 2.21mm MLS. Since then pt drowsy sleepy, with increased WOB and RR. Hard to arouse. Hb down to 6.5, needing PRBC transfusion. Also discussed with NSG Dr. Conchita Paris, may be candidate for hemicrani.   OBJECTIVE Vitals:   07/14/2020 0200 07/12/2020 0300 06/28/2020 0400 07/11/2020 0600  BP:  (!) 118/57 (!) 102/57 (!) 89/45  Pulse: (!) 122 (!) 133 (!) 128 (!) 121  Resp: (!) 29 (!) 38 (!) 33 (!) 28  Temp:   99.9 F (37.7 C)   TempSrc:   Axillary   SpO2:  (!) 89% 99% 97%    CBC:  Recent Labs  Lab 07/21/2020 2136 07/03/2020 2210 07/01/2020 2342  WBC 19.9*  --  16.6*  NEUTROABS 17.7*  --   --   HGB 8.0* 8.2* 8.6*  HCT 26.9* 24.0* 28.6*  MCV 95.4  --  94.1  PLT 103*  --  83*    Basic Metabolic Panel:  Recent Labs  Lab 07/17/2020 2136 07/15/2020 2210 07/03/2020 2222 07/06/2020 2342  NA 132* 132* 130*  --   K 4.1 4.1  --   --   CL 99 100  --   --   CO2 20*  --   --   --   GLUCOSE 111* 108*  --   --   BUN 20 22*  --   --   CREATININE 0.97 0.80  --  0.75  CALCIUM 7.9*  --   --   --     Lipid Panel: No results found for: CHOL, TRIG, HDL, CHOLHDL, VLDL, LDLCALC HgbA1c: No results found for: HGBA1C Urine Drug Screen:     Component Value Date/Time   LABOPIA NONE DETECTED 07/23/2020 0049   COCAINSCRNUR NONE DETECTED 07/14/2020 0049   LABBENZ NONE DETECTED 07/11/2020 0049   AMPHETMU NONE DETECTED 06/29/2020 0049   THCU NONE DETECTED 07/06/2020 0049   LABBARB NONE DETECTED 06/26/2020 0049    Alcohol Level     Component Value Date/Time   ETH <10 07/05/2020 2239    IMAGING  CT ABDOMEN PELVIS WO CONTRAST 06/26/2020 IMPRESSION:  1. Complex free fluid in the right pericolic gutter, as well as in the pelvis, suspicious for hemoperitoneum. Etiology is uncertain, however there is suggestion of an ovoid high  density structure in the right adnexa spanning 5.1 cm, that may represent a hemorrhagic cyst.  2. Splenomegaly. Multiple areas of low-density within the spleen suspicious for infarcts. There is a dominant cystic structure superiorly spanning 8.3 cm, indeterminate for additional infarct, cyst, or abscess.  3. Right pleural effusion. Patchy and ground-glass opacities in both lung bases, also confluent dependently. Findings may be secondary to pulmonary edema, pneumonia, or combination thereof.  4. Generalized edema of the subcutaneous and intra-abdominal fat.  5. Contrast within both renal collecting systems from prior neck CTA. There is suggestion of mild striated nephrograms, which can be seen with urinary tract infection.  6. Patient may benefit from contrast enhanced exam to further delineate. If she is hemodynamically stable, recommend trending of renal function, and follow-up CT could be performed in the next day.  CT Code Stroke CTA Head W/WO contrast CT Code Stroke CTA Neck W/WO contrast 07/20/2020 IMPRESSION:  1. Occlusion of the proximal right M1 segment with limited collateralization within the anterior right MCA territory.  2.  Large right MCA territory infarct with previously reported ASPECTS of 0.  3. Small pleural effusions with areas of right apical consolidation.   CT HEAD WO CONTRAST 07/18/2020 IMPRESSION:  1. Extensive Right MCA territory infarct with cytotoxic edema and mild intracranial mass effect not significantly changed from 2138 hours yesterday. Possible petechial hemorrhage, but no malignant hemorrhagic transformation.  2. Stable trace leftward midline shift. Basilar cisterns remain patent.  3. Other vascular territories remain within normal limits.   CT CEREBRAL PERFUSION W CONTRAST 07/09/2020   ADDENDUM REPORT: 07/06/2020 00:10 ADDENDUM: The above numbers refer strictly to the computed data. Given the large amount of hypoattenuation demonstrated on the pre contrast  head CT, the degree of core infarct is likely underestimated by the RAPID software. This is most evident at the supraganglionic level and in the anterior ganglionic level zones. However, it is not possible to accurately quantify the degree to which the calculated core infarct is underestimated. The calculated area of Tmax>6.0s appears to more closely correspond to the hypodense region on the earlier noncontrast CT than does the area of CBF<30%. I suspect the greatest area of penumbra is in the posterior right MCA territory. Result Date: 06/25/2020 CLINICAL DATA:  Right MCA stroke EXAM: CT PERFUSION BRAIN TECHNIQUE: Multiphase CT imaging of the brain was performed following IV bolus contrast injection. Subsequent parametric perfusion maps were calculated using RAPID software. CONTRAST:  54mL OMNIPAQUE IOHEXOL 350 MG/ML SOLN COMPARISON:  None. FINDINGS: CT Brain Perfusion Findings: CBF (<30%) Volume: 75mL Perfusion (Tmax>6.0s) volume: Mismatch Volume: ASPECTS on noncontrast CT Head: 0 at 9:38 p.m. today. Infarct Core: 35 mL Infarction Location:Right MCA territory  IMPRESSION:  35 mL core infarct in the right MCA territory with 110 mL surrounding ischemic penumbra.   DG Chest Portable 1 View 07-12-2020 IMPRESSION:  Cardiomegaly with findings of CHF. Pneumonia is not excluded.   CT HEAD CODE STROKE WO CONTRAST 07-12-20 IMPRESSION:  1. Acute/subacute right MCA territory infarct. No intracranial hemorrhage.  2. ASPECTS is 0   Transthoracic Echocardiogram  00/00/2021 Pending  ECG - ST rate 120 BPM. (See cardiology reading for complete details)  PHYSICAL EXAM  Temp:  [97.6 F (36.4 C)-100.4 F (38 C)] 99.3 F (37.4 C) (12/15 0800) Pulse Rate:  [115-133] 126 (12/15 0900) Resp:  [22-38] 34 (12/15 0900) BP: (89-124)/(45-64) 124/61 (12/15 1000) SpO2:  [89 %-99 %] 93 % (12/15 0900) Arterial Line BP: (93)/(47) 93/47 (12/15 0900) Weight:  [54.3 kg] 54.3 kg (12/15 1000)  General -  Well nourished, well developed, increase WOB and RR, drowsy sleepy.  Ophthalmologic - fundi not visualized due to noncooperation.  Cardiovascular - Regular rhythm and tachycardia 120s.  Neuro - eyes closed, not open with voice. Drowsy sleepy but moans with stimulation. No language output but followed simple commands on the right hand and foot. With forced eye opening, right gaze preference, barely cross midline. Not blinking to visual threat bilaterally. Left facial droop. Left UE flaccid, but with pain, mild withdraw. RUE spontaneous and purposeful movement against gravity. LLE 2/5 withdraw to pain, RLE spontaneous movement and strong withdraw to pain. Sensation, coordination not cooperative and gait not tested.    ASSESSMENT/PLAN Ms. Terri Oneal is a 39 y.o. female with history of active IV heroin abuser with a history of anxiety, depression, tobacco use, hypothyroidism, migraines and mitral valve prolapse who is brought in by EMS as a Code Stroke for acute onset of left hemiplegia with rightward gaze and left hemineglect. She did not receive  IV t-PA due to late presentation (>4.5 hours from time of onset). Not an IR candidate based on perfusion images.  Stroke: large Rt MCA infarct - likely cardio embolic - source unknown, DDx including endocarditis given hx of IVDU, paradoxical emboli, hypercoagulable state with ? Abdominal mass  CT Head - Acute/subacute right MCA territory infarct. No intracranial hemorrhage. ASPECTS is 0   CTA H&N - Occlusion of the proximal right M1 segment with limited collateralization within the anterior right MCA territory.   CTP - 35 mL core infarct in the right MCA territory with 110 mL surrounding ischemic penumbra. (see full report above)  CT head - 12/15 - Extensive Right MCA territory infarct with cytotoxic edema and mild intracranial mass effect not significantly changed from 2138 hours yesterday.   LE venous doppler pending  2D Echo - pending  Sars  Corona Virus 2 - negative  LDL - pending  HgbA1c - pending  UDS - negative  VTE prophylaxis - SCDsheparin  No antithrombotic prior to admission, now on no antithrombotics due to anemia needing PRBC  Ongoing aggressive stroke risk factor management  Therapy recommendations:  pending  Disposition:  Pending  Cerebral edema Chronic hyponatremia  CT x 3 large right MCA infarct with mass effect  Na 130->138->140  3% saline @ 50-> NS @ 50  Given chronic hyponatremia will be careful for Na rise, no more than increase in 24h  Na monitoring  Discussed with mom over the phone, she is Ok with hemicrani if needed.  NSG on board   Acute blood loss anemia Abdominal mass vs. Hemorrhagic cyst hemoperitoneum  Hb 8.0->8.6->6.5->6.1  CT A/P x 2 showed possible hemoperitoneum, spleen infarct vs. Abscess, large adnexal mass  PRBC transfusion  CBC monitoring  Sepsis   Tachycardia HR 120s with soft BP  Tmax 100.4  WBC 19.9->16.6->20.7  LA 3.2->1.6  CT A/P ? Spleen infarct vs. Abscess  IVDU  TTE pending  May need TEE to rule out endocarditis  Blood culture pending  On cefepime and vanco  Tobacco abuse  Current smoker  Smoking cessation counseling will be provided  IVDU  Addicted to heroin  Cessation counseling will be provided  Other Stroke Risk Factors  ETOH use, advised to drink no more than 1 alcoholic beverage per day.  Migraines  Other Active Problems, Findings and Recommendations  Code status - Full code  Thrombocytopenia - platelets - 103->83  CXR - CHF vs pneumonia  Hospital day # 1  This patient is critically ill due to large right MCA infarct, cerebral edema, acute blood loss anemia, abdominal mass, hemoperitoneum, sepsis, fever and at significant risk of neurological worsening, death form septic shock, cerebral edema, brain herniation, heart failure, seizure, severe anemia. This patient's care requires constant monitoring of  vital signs, hemodynamics, respiratory and cardiac monitoring, review of multiple databases, neurological assessment, discussion with family, other specialists and medical decision making of high complexity. I spent 55 minutes of neurocritical care time in the care of this patient. I had long discussion with fiance at bedside and mom over the phone, updated pt current condition, treatment plan and potential prognosis, and answered all the questions. They expressed understanding and appreciation. I also discussed with NSG Dr. Conchita Paris and CCM resident Dr. Nedra Hai.   Marvel Plan, MD PhD Stroke Neurology 06/30/2020 12:58 PM  To contact Stroke Continuity provider, please refer to [email or web address]. After hours, contact General Neurology

## 2020-07-08 NOTE — Procedures (Signed)
Intubation Procedure Note  Terri Oneal  [id number]  12/29/1980  Date:07/05/2020  Time:3:10 PM   Provider Performing:Teshia Mahone Kirtland Bouchard Nedra Hai    Procedure: Intubation (31500)  Indication(s) Respiratory Failure  Consent Risks of the procedure as well as the alternatives and risks of each were explained to the patient and/or caregiver.  Consent for the procedure was obtained and is signed in the bedside chart   Anesthesia Etomidate Versed Fentanyl Rocuronium  Time Out Verified patient identification, verified procedure, site/side was marked, verified correct patient position, special equipment/implants available, medications/allergies/relevant history reviewed, required imaging and test results available.   Sterile Technique Usual hand hygeine, masks, and gloves were used   Procedure Description Patient positioned in bed supine.  Sedation given as noted above.  Patient was intubated with endotracheal tube using Glidescope.  View was Grade 1 full glottis .  Number of attempts was 1.  Colorimetric CO2 detector was consistent with tracheal placement.   Complications/Tolerance None; patient tolerated the procedure well. Chest X-ray is ordered to verify placement.   EBL Minimal   Specimen(s) None

## 2020-07-08 NOTE — Consult Note (Addendum)
Date of Admission:  07/06/2020          Reason for Consult: Enterococcal bacteremia    Referring Provider:CHAMP auto consult  Assessment:  1. 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 Large R MCA infarction flacid hemplegia and progressively worsening LOC 3. Hx of prior infectious endocarditis 4. MVP 5. IVDU with heroin 6. Hx of TKA   Plan:  1. Change to high-dose ampicillin and ceftriaxone 2 g every 12 2. Follow-up 2D echocardiogram 3. She will need a line holiday when she is stable to no longer have a PICC line 4. She is undergoing decompressive craniectomy today 5. Rescreen for viral hepatitides  Active Problems:   Acute stroke due to occlusion of right middle cerebral artery (HCC)   Scheduled Meds: . sodium chloride   Intravenous Once  . Chlorhexidine Gluconate Cloth  6 each Topical Daily  . heparin  5,000 Units Subcutaneous Q8H  . LORazepam      . pantoprazole (PROTONIX) IV  40 mg Intravenous Q24H  . sodium chloride flush  10-40 mL Intracatheter Q12H   Continuous Infusions: . sodium chloride    . ampicillin (OMNIPEN) IV    . cefTRIAXone (ROCEPHIN)  IV     PRN Meds:.fentaNYL (SUBLIMAZE) injection, ondansetron (ZOFRAN) IV, sodium chloride flush  HPI: Terri Oneal is a 39 y.o. female with hx of active IV drug use (heroin), prior endocarditis who had been normal at 9 in the morning when her boyfriend left her home.  He came back at 3 PM and found her with complete flaccid paralysis on the left side.  After failing to build to bring her to the ER himself he eventually called EMS at 8:37 PM patient was densely hemiplegic on the left rightward gaze.  CT angiogram showed a large MCA infarct occlusion of the right proximal M1 segment.  CT of the abdomen pelvis showed complex fluid in the paracolic gutter with area suspicious for hemoperitoneum, splenomegaly multiple infarcts present  Blood  cultures were taken and she was placed on broad-spectrum antibiotics.  She is not a candidate for mechanical thrombectomy but is having worsening progression of her infarct and is scheduled today for decompressive craniectomy with neurosurgery.  Blood cultures have in the interim turned positive for Enterococcus faecalis.  She undoubtedly has endocarditis with multiple areas of embolization.  We will switch her to ampicillin and ceftriaxone and follow-up 2D echocardiogram.  With left sided endocarditis we will want to involve CT surgery at appropriate time but I am very skeptical they would operate on her.   Review of Systems: Review of Systems  Unable to perform ROS: Patient unresponsive    Past Medical History:  Diagnosis Date  . Anxiety and depression   . Hypothyroid   . Migraine   . MVP (mitral valve prolapse)   . Opioid abuse, in remission (HCC)   . Polysubstance abuse (HCC)     Social History   Tobacco Use  . Smoking status: Current Every Day Smoker    Packs/day: 1.00    Types: Cigarettes  . Smokeless tobacco: Never Used  Substance Use Topics  . Alcohol use: Yes  . Drug use: Yes    Types: Heroin, IV    Comment: heroin    Family History  Problem Relation Age of Onset  . Diabetes Other   . Hypertension Other   . Thyroid disease Other    No Known Allergies  OBJECTIVE:  Blood pressure (!) 107/51, pulse (!) 120, temperature 99.7 F (37.6 C), temperature source Axillary, resp. rate (!) 28, height 5\' 4"  (1.626 m), weight 54.3 kg, SpO2 96 %.  Physical Exam Constitutional:      General: She is not in acute distress.    Appearance: She is ill-appearing. She is not diaphoretic.  HENT:     Head: Normocephalic and atraumatic.     Right Ear: External ear normal.     Left Ear: External ear normal.     Nose: Nose normal.     Mouth/Throat:     Mouth: Oropharynx is clear and moist.     Pharynx: No oropharyngeal exudate.  Eyes:     General: No scleral icterus.     Extraocular Movements: EOM normal.  Cardiovascular:     Rate and Rhythm: Normal rate and regular rhythm.     Heart sounds: Murmur heard.  No friction rub. No gallop.   Pulmonary:     Effort: Tachypnea, accessory muscle usage and respiratory distress present.     Breath sounds: No wheezing or rales.  Abdominal:     General: Bowel sounds are normal. There is distension.     Palpations: Abdomen is soft.     Tenderness: There is abdominal tenderness. There is guarding. There is no rebound.  Musculoskeletal:        General: Tenderness present. No edema.     Cervical back: Normal range of motion and neck supple.  Lymphadenopathy:     Cervical: No cervical adenopathy.  Skin:    General: Skin is warm.     Coloration: Skin is jaundiced. Skin is not pale.  Neurological:     Mental Status: She is unresponsive.    Left sided hemiplegia  Lab Results Lab Results  Component Value Date   WBC 20.7 (H) Mar 20, 2020   HGB 6.1 (LL) Mar 20, 2020   HCT 18.0 (L) Mar 20, 2020   MCV 92.5 Mar 20, 2020   PLT 113 (L) Mar 20, 2020    Lab Results  Component Value Date   CREATININE 0.77 Mar 20, 2020   BUN 19 Mar 20, 2020   NA 140 Mar 20, 2020   K 3.7 Mar 20, 2020   CL 106 Mar 20, 2020   CO2 21 (L) Mar 20, 2020    Lab Results  Component Value Date   ALT 18 07/05/2020   AST 42 (H) 07/10/2020   ALKPHOS 157 (H) 07/20/2020   BILITOT 3.5 (H) 07/19/2020     Microbiology: Recent Results (from the past 240 hour(s))  Resp Panel by RT-PCR (Flu A&B, Covid) Nasopharyngeal Swab     Status: None   Collection Time: 06/28/2020  9:53 PM   Specimen: Nasopharyngeal Swab; Nasopharyngeal(NP) swabs in vial transport medium  Result Value Ref Range Status   SARS Coronavirus 2 by RT PCR NEGATIVE NEGATIVE Final    Comment: (NOTE) SARS-CoV-2 target nucleic acids are NOT DETECTED.  The SARS-CoV-2 RNA is generally detectable in upper respiratory specimens during the acute phase of infection. The lowest concentration of SARS-CoV-2 viral  copies this assay can detect is 138 copies/mL. A negative result does not preclude SARS-Cov-2 infection and should not be used as the sole basis for treatment or other patient management decisions. A negative result may occur with  improper specimen collection/handling, submission of specimen other than nasopharyngeal swab, presence of viral mutation(s) within the areas targeted by this assay, and inadequate number of viral copies(<138 copies/mL). A negative result must be combined with clinical observations, patient history, and epidemiological information. The expected result is Negative.  Fact Sheet for  Patients:  BloggerCourse.com  Fact Sheet for Healthcare Providers:  SeriousBroker.it  This test is no t yet approved or cleared by the [place] FDA and  has been authorized for detection and/or diagnosis of SARS-CoV-2 by FDA under an Emergency Use Authorization (EUA). This EUA will remain  in effect (meaning this test can be used) for the duration of the COVID-19 declaration under Section 564(b)(1) of the Act, 21 U.S.C.section 360bbb-3(b)(1), unless the authorization is terminated  or revoked sooner.       Influenza A by PCR NEGATIVE NEGATIVE Final   Influenza B by PCR NEGATIVE NEGATIVE Final    Comment: (NOTE) The Xpert Xpress SARS-CoV-2/FLU/RSV plus assay is intended as an aid in the diagnosis of influenza from Nasopharyngeal swab specimens and should not be used as a sole basis for treatment. Nasal washings and aspirates are unacceptable for Xpert Xpress SARS-CoV-2/FLU/RSV testing.  Fact Sheet for Patients: BloggerCourse.com  Fact Sheet for Healthcare Providers: SeriousBroker.it  This test is not yet approved or cleared by the [place] FDA and has been authorized for detection and/or diagnosis of SARS-CoV-2 by FDA under an Emergency Use Authorization (EUA). This  EUA will remain in effect (meaning this test can be used) for the duration of the COVID-19 declaration under Section 564(b)(1) of the Act, 21 U.S.C. section 360bbb-3(b)(1), unless the authorization is terminated or revoked.  Performed at [facility], [address], [place], [place] [address]   Culture, blood (single)     Status: None (Preliminary result)   Collection Time: 07/16/2020 11:40 PM   Specimen: BLOOD  Result Value Ref Range Status   Specimen Description BLOOD RIGHT ANTECUBITAL  Final   Special Requests   Final    BOTTLES DRAWN AEROBIC AND ANAEROBIC Blood Culture adequate volume   Culture  Setup Time   Final    GRAM POSITIVE COCCI IN CHAINS IN BOTH AEROBIC AND ANAEROBIC BOTTLES Organism ID to follow CRITICAL RESULT CALLED TO, READ BACK BY AND VERIFIED WITH: Antoine Primas PharmD 14:00 August 03, 2020 ([email or web address]) Performed at [facility], [address] [place], [place] [address]    Culture GRAM POSITIVE COCCI  Final   Report Status PENDING  Incomplete  Culture, blood (routine x 2)     Status: None (Preliminary result)   Collection Time: 07/04/2020 11:40 PM   Specimen: BLOOD  Result Value Ref Range Status   Specimen Description BLOOD RIGHT BICEP  Final   Special Requests   Final    BOTTLES DRAWN AEROBIC AND ANAEROBIC Blood Culture adequate volume   Culture  Setup Time   Final    GRAM POSITIVE COCCI IN CHAINS IN BOTH AEROBIC AND ANAEROBIC BOTTLES IDENTIFICATION TO FOLLOW CRITICAL RESULT CALLED TO, READ BACK BY AND VERIFIED WITH: Antoine Primas PharmD 14:00 08-03-2020 ([email or web address]) Performed at [facility], [address], [place], [place] [address]    Culture PENDING  Incomplete   Report Status PENDING  Incomplete  Blood Culture ID Panel (Reflexed)     Status: Abnormal   Collection Time: 07/09/2020 11:40 PM  Result Value Ref Range Status   Enterococcus faecalis DETECTED (A) NOT DETECTED Final    Comment: CRITICAL RESULT CALLED TO, READ BACK BY AND VERIFIED WITH: Antoine Primas  PharmD 14:00 2020-08-03 ([email or web address])    Enterococcus Faecium NOT DETECTED NOT DETECTED Final   Listeria monocytogenes NOT DETECTED NOT DETECTED Final   Staphylococcus species NOT DETECTED NOT DETECTED Final   Staphylococcus aureus (BCID) NOT DETECTED NOT DETECTED Final  Staphylococcus epidermidis NOT DETECTED NOT DETECTED Final   Staphylococcus lugdunensis NOT DETECTED NOT DETECTED Final   Streptococcus species NOT DETECTED NOT DETECTED Final   Streptococcus agalactiae NOT DETECTED NOT DETECTED Final   Streptococcus pneumoniae NOT DETECTED NOT DETECTED Final   Streptococcus pyogenes NOT DETECTED NOT DETECTED Final   A.calcoaceticus-baumannii NOT DETECTED NOT DETECTED Final   Bacteroides fragilis NOT DETECTED NOT DETECTED Final   Enterobacterales NOT DETECTED NOT DETECTED Final   Enterobacter cloacae complex NOT DETECTED NOT DETECTED Final   Escherichia coli NOT DETECTED NOT DETECTED Final   Klebsiella aerogenes NOT DETECTED NOT DETECTED Final   Klebsiella oxytoca NOT DETECTED NOT DETECTED Final   Klebsiella pneumoniae NOT DETECTED NOT DETECTED Final   Proteus species NOT DETECTED NOT DETECTED Final   Salmonella species NOT DETECTED NOT DETECTED Final   Serratia marcescens NOT DETECTED NOT DETECTED Final   Haemophilus influenzae NOT DETECTED NOT DETECTED Final   Neisseria meningitidis NOT DETECTED NOT DETECTED Final   Pseudomonas aeruginosa NOT DETECTED NOT DETECTED Final   Stenotrophomonas maltophilia NOT DETECTED NOT DETECTED Final   Candida albicans NOT DETECTED NOT DETECTED Final   Candida auris NOT DETECTED NOT DETECTED Final   Candida glabrata NOT DETECTED NOT DETECTED Final   Candida krusei NOT DETECTED NOT DETECTED Final   Candida parapsilosis NOT DETECTED NOT DETECTED Final   Candida tropicalis NOT DETECTED NOT DETECTED Final   Cryptococcus neoformans/gattii NOT DETECTED NOT DETECTED Final   Vancomycin resistance NOT DETECTED NOT DETECTED Final    Comment: Performed at [facility], [address], [place], [place] [address]    Terri Lav, MD [facility] [phone] pager  Aug 07, 2020, 2:38 PM

## 2020-07-08 NOTE — Progress Notes (Signed)
RT NOTE:  RT unable to place aline in left radial artery due to pt fighting and jerking arm even w/ RT X 2 and RN attempting to hold patients arm. Pt's heart rate increased to 140-150's.

## 2020-07-08 NOTE — Progress Notes (Signed)
eLink Physician-Brief Progress Note Patient Name: Terri Oneal DOB: 01-Nov-1980 MRN: [id number]   Date of Service  08/04/2020  HPI/Events of Note  Patient is s/p crani for late presenting MCA stroke. Goal SBP 120-140 mmHg. Had a brief drip in SBP to 90-100 mmHg, although spontaneously recovered to 120s. RN asks for pressor to maintain BP at goal in case she dips again.   eICU Interventions  Ordered Neo drip to maintain SBP 120-140 mmHg, though this should only be started for sustained SBP < 120.     Intervention Category Intermediate Interventions: Hypotension - evaluation and management  Janae Bridgeman 08/04/20, 11:36 PM

## 2020-07-08 NOTE — Op Note (Signed)
NEUROSURGERY OPERATIVE NOTE   PREOP DIAGNOSIS:  1. Intracranial hypertension 2. Right MCA Stroke   POSTOP DIAGNOSIS: Same  PROCEDURE: 1. Decompressive right hemicraniectomy  SURGEON: Dr. Lisbeth Renshaw, MD  ASSISTANT: None  ANESTHESIA: General Endotracheal  EBL: 50cc  SPECIMENS: None  DRAINS: None  COMPLICATIONS: None immediate  CONDITION: Hemodynamically stable to neuro ICU  HISTORY: Terri Oneal is a 39 y.o. female initially presenting to the hospital after being found by her boyfriend with dense left hemiplegia.  Initial work-up included CT scan demonstrating a large right-sided MCA territory infarct.  She was noted to become progressively more lethargic throughout the course of the day ultimately requiring intubation for airway protection.  After discussion with the neurology service, she was felt to be at high risk for developing severe intracranial hypertension, and given the size of her infarct, young age, and progressive lethargy it was felt that early decompressive craniectomy was indicated.  The situation was discussed with the patient's mother including the alternative treatments, risks of the procedure, and expected postoperative course and recovery.  After all questions were answered, the patient's mother provided consent to proceed with surgery.  PROCEDURE IN DETAIL: The patient was brought to the operating room. After induction of general anesthesia, the patient was positioned on the operative table in the supine position. All pressure points were meticulously padded.  Large right-sided reverse question-mark frontotemporoparietal skin incision was then marked out and prepped and draped in the usual sterile fashion.  After timeout was conducted, the incision was infiltrated with local anesthetic with epinephrine.  Incision was then made sharply and carried down through the galea.  Raney clips were applied for hemostasis on the skin edges.  Bovie electrocautery  was used to then dissect through the periosteum as well as the temporal fascia and muscle.  A single piece myocutaneous flap was then reflected anteriorly.  Multiple bur holes were then created and connected with a craniotome, and a large single piece frontotemporoparietal craniotomy flap was elevated.  Leksell rongeurs were then used to remove a portion of the squamous temporal bone in order to achieve good subtemporal decompression.  This was nearly flush with the middle cranial fossa floor and nearly anterior enough to reach the temporal pole.  Hemostasis on the epidural plane was easily secured using a combination of bipolar electrocautery and morselized Gelfoam with thrombin.  At this point the dura was opened sharply in a stellate fashion.  Within minutes, the brain was noted to be under a significant amount of tension and herniated through the craniectomy defect to a level at least as high as the bone.  There was no active bleeding noted on the pial surface.   A large piece of collagen onlay graft was then placed over the dural surface.  A second piece of bovine pericardial graft was then placed over the DuraGen.  The wound was then irrigated with normal saline irrigation.  The galea and temporalis fascia were then reapproximated using interrupted 0 and 3-0 Vicryl stitches.  Skin was closed with staples.  Bacitracin ointment and sterile dressing was applied.  Given the patient's body habitus with minimal abdominal fat, I did not feel there was significant enough room in the subcutaneous tissue to implant the bone flap.  The bone flap was therefore discarded with the plan for custom implant when the patient returns for cranioplasty.  At the end of the case all sponge, needle, and instrument counts were correct. The patient was then transferred to the stretcher, and  taken to the neuro intensive care unit intubated in stable hemodynamic condition.   Lisbeth Renshaw, MD [address] [facility]

## 2020-07-08 NOTE — Progress Notes (Signed)
eLink Physician-Brief Progress Note Patient Name: Terri Oneal DOB: Jun 17, 1981 MRN: [id number]   Date of Service  2020/07/27  HPI/Events of Note  Patient c/o abdominal pain - DDx includes 1. Narcotic withdrawal vs Ischemia d/t embolic issue to gut circulation.   eICU Interventions  Plan: 1. Fentanyl 25-50 mcg IV Q 2 hours PRN pain.  2. If pain not improved with Fentanyl, will need to w/u for abdominal ischemia.      Intervention Category Major Interventions: Other:  [name] Jul 27, 2020, 2:22 AM

## 2020-07-08 NOTE — Procedures (Signed)
Central Venous Catheter Insertion Procedure Note  Terri Oneal  [id number]  1981/07/03  Date:07/07/2020  Time:5:55 AM   Provider Performing:Nethaniel Mattie Carmon Ginsberg Earlene Plater   Procedure: Insertion of Non-tunneled Central Venous [phone]) with US guidance ([id number])   Indication(s) Medication administration and Difficult access  Consent Risks of the procedure as well as the alternatives and risks of each were explained to the patient and/or caregiver.  Consent for the procedure was obtained and is signed in the bedside chart  Anesthesia Topical only with 1% lidocaine   Timeout Verified patient identification, verified procedure, site/side was marked, verified correct patient position, special equipment/implants available, medications/allergies/relevant history reviewed, required imaging and test results available.  Sterile Technique Maximal sterile technique including full sterile barrier drape, hand hygiene, sterile gown, sterile gloves, mask, hair covering, sterile ultrasound probe cover (if used).  Procedure Description Area of catheter insertion was cleaned with chlorhexidine and draped in sterile fashion.  With real-time ultrasound guidance a central venous catheter was placed into the right internal jugular vein. Nonpulsatile blood flow and easy flushing noted in all ports.  The catheter was sutured in place and sterile dressing applied.  Complications/Tolerance None; patient tolerated the procedure well. Chest X-ray is ordered to verify placement for internal jugular or subclavian cannulation.   Chest x-ray is not ordered for femoral cannulation.  EBL Minimal  Specimen(s) None  Delfin Gant, NP-C  Pulmonary & Critical Care Contact / Pager information can be found on Amion  06/25/2020, 5:55 AM

## 2020-07-08 NOTE — Progress Notes (Signed)
Notified bedside nurse of need to draw repeat lactic acid. RN informed Clinical research associate pt is difficult stick and RT will attempt to place aline.

## 2020-07-08 NOTE — Progress Notes (Signed)
Pt BP low after ativan administration for central line placement. Pt still sedated making neuro assessment difficult. CCM MD aware. Will repeat head CT at 10AM and place Aline.

## 2020-07-08 NOTE — Anesthesia Preprocedure Evaluation (Signed)
Anesthesia Evaluation  Patient identified by MRN, date of birth, ID band Patient unresponsive    Reviewed: Allergy & Precautions, Patient's Chart, lab work & pertinent test results, Unable to perform ROS - Chart review only  History of Anesthesia Complications Negative for: history of anesthetic complications  Airway Mallampati: Intubated       Dental   Pulmonary Current Smoker,   Intubated    breath sounds clear to auscultation   + intubated    Cardiovascular + Valvular Problems/Murmurs  Rhythm:Regular Rate:Tachycardia   Aortic valve vegetation    Neuro/Psych  Headaches, Anxiety Depression CVA, Residual Symptoms negative psych ROS   GI/Hepatic negative GI ROS, (+)     substance abuse  IV drug use,   Endo/Other  Hypothyroidism  Hypocalcemia   Renal/GU negative Renal ROS     Musculoskeletal negative musculoskeletal ROS (+)   Abdominal   Peds  Hematology  (+) Blood dyscrasia, anemia ,  WBC 20.7k Hct 25 Plt 113k INR 1.5    Anesthesia Other Findings Covid test negative   Reproductive/Obstetrics                             Anesthesia Physical Anesthesia Plan  ASA: IV  Anesthesia Plan: General   Post-op Pain Management:    Induction: Inhalational  PONV Risk Score and Plan: 3 and Treatment may vary due to age or medical condition  Airway Management Planned: Oral ETT  Additional Equipment:   Intra-op Plan:   Post-operative Plan: Post-operative intubation/ventilation  Informed Consent:     History available from chart only  Plan Discussed with: CRNA and Anesthesiologist  Anesthesia Plan Comments:         Anesthesia Quick Evaluation

## 2020-07-09 ENCOUNTER — Encounter (HOSPITAL_COMMUNITY): Payer: Self-pay | Admitting: Pulmonary Disease

## 2020-07-09 ENCOUNTER — Inpatient Hospital Stay (HOSPITAL_COMMUNITY): Payer: Self-pay

## 2020-07-09 ENCOUNTER — Encounter (HOSPITAL_COMMUNITY): Payer: Medicaid Other

## 2020-07-09 DIAGNOSIS — I76 Septic arterial embolism: Secondary | ICD-10-CM

## 2020-07-09 DIAGNOSIS — I330 Acute and subacute infective endocarditis: Secondary | ICD-10-CM

## 2020-07-09 DIAGNOSIS — J9602 Acute respiratory failure with hypercapnia: Secondary | ICD-10-CM

## 2020-07-09 DIAGNOSIS — D735 Infarction of spleen: Secondary | ICD-10-CM

## 2020-07-09 DIAGNOSIS — F1990 Other psychoactive substance use, unspecified, uncomplicated: Secondary | ICD-10-CM

## 2020-07-09 DIAGNOSIS — A419 Sepsis, unspecified organism: Secondary | ICD-10-CM

## 2020-07-09 DIAGNOSIS — I351 Nonrheumatic aortic (valve) insufficiency: Secondary | ICD-10-CM

## 2020-07-09 DIAGNOSIS — I358 Other nonrheumatic aortic valve disorders: Secondary | ICD-10-CM

## 2020-07-09 DIAGNOSIS — J9601 Acute respiratory failure with hypoxia: Secondary | ICD-10-CM

## 2020-07-09 DIAGNOSIS — F17200 Nicotine dependence, unspecified, uncomplicated: Secondary | ICD-10-CM

## 2020-07-09 DIAGNOSIS — G936 Cerebral edema: Secondary | ICD-10-CM

## 2020-07-09 DIAGNOSIS — I340 Nonrheumatic mitral (valve) insufficiency: Secondary | ICD-10-CM

## 2020-07-09 DIAGNOSIS — I339 Acute and subacute endocarditis, unspecified: Secondary | ICD-10-CM

## 2020-07-09 HISTORY — DX: Other psychoactive substance use, unspecified, uncomplicated: F19.90

## 2020-07-09 HISTORY — DX: Septic arterial embolism: I76

## 2020-07-09 HISTORY — DX: Acute and subacute infective endocarditis: I33.0

## 2020-07-09 HISTORY — DX: Infarction of spleen: D73.5

## 2020-07-09 LAB — URINALYSIS, ROUTINE W REFLEX MICROSCOPIC
Glucose, UA: NEGATIVE mg/dL
Hgb urine dipstick: NEGATIVE
Ketones, ur: NEGATIVE mg/dL
Leukocytes,Ua: NEGATIVE
Nitrite: NEGATIVE
Protein, ur: 100 mg/dL — AB
Specific Gravity, Urine: 1.042 — ABNORMAL HIGH (ref 1.005–1.030)
pH: 5 (ref 5.0–8.0)

## 2020-07-09 LAB — TYPE AND SCREEN
ABO/RH(D): A POS
Antibody Screen: NEGATIVE
Unit division: 0
Unit division: 0

## 2020-07-09 LAB — BPAM RBC
Blood Product Expiration Date: 202201112359
Blood Product Expiration Date: 202201112359
ISSUE DATE / TIME: 202112151410
ISSUE DATE / TIME: 202112151410
Unit Type and Rh: 6200
Unit Type and Rh: 6200

## 2020-07-09 LAB — COMPREHENSIVE METABOLIC PANEL
ALT: 15 U/L (ref 0–44)
AST: 34 U/L (ref 15–41)
Albumin: 1.7 g/dL — ABNORMAL LOW (ref 3.5–5.0)
Alkaline Phosphatase: 102 U/L (ref 38–126)
Anion gap: 11 (ref 5–15)
BUN: 20 mg/dL (ref 6–20)
CO2: 19 mmol/L — ABNORMAL LOW (ref 22–32)
Calcium: 7.6 mg/dL — ABNORMAL LOW (ref 8.9–10.3)
Chloride: 116 mmol/L — ABNORMAL HIGH (ref 98–111)
Creatinine, Ser: 0.85 mg/dL (ref 0.44–1.00)
GFR, Estimated: >60 mL/min (ref >60)
Glucose, Bld: 94 mg/dL (ref 70–99)
Potassium: 3.7 mmol/L (ref 3.5–5.1)
Sodium: 146 mmol/L — ABNORMAL HIGH (ref 135–145)
Total Bilirubin: 7.6 mg/dL — ABNORMAL HIGH (ref 0.3–1.2)
Total Protein: 4.7 g/dL — ABNORMAL LOW (ref 6.5–8.1)

## 2020-07-09 LAB — POCT I-STAT 7, (LYTES, BLD GAS, ICA,H+H)
Acid-base deficit: 4 mmol/L — ABNORMAL HIGH (ref 0.0–2.0)
Bicarbonate: 18.5 mmol/L — ABNORMAL LOW (ref 20.0–28.0)
Calcium, Ion: 1.14 mmol/L — ABNORMAL LOW (ref 1.15–1.40)
HCT: 24 % — ABNORMAL LOW (ref 36.0–46.0)
Hemoglobin: 8.2 g/dL — ABNORMAL LOW (ref 12.0–15.0)
O2 Saturation: 95 %
Patient temperature: 101.8
Potassium: 3.5 mmol/L (ref 3.5–5.1)
Sodium: 147 mmol/L — ABNORMAL HIGH (ref 135–145)
TCO2: 19 mmol/L — ABNORMAL LOW (ref 22–32)
pCO2 arterial: 25.1 mmHg — ABNORMAL LOW (ref 32.0–48.0)
pH, Arterial: 7.481 — ABNORMAL HIGH (ref 7.350–7.450)
pO2, Arterial: 73 mmHg — ABNORMAL LOW (ref 83.0–108.0)

## 2020-07-09 LAB — URINALYSIS, MICROSCOPIC (REFLEX)

## 2020-07-09 LAB — HEPATITIS PANEL, ACUTE
HCV Ab: REACTIVE — AB
Hep A IgM: NONREACTIVE
Hep B C IgM: NONREACTIVE
Hepatitis B Surface Ag: NONREACTIVE

## 2020-07-09 LAB — CBC
HCT: 25.8 % — ABNORMAL LOW (ref 36.0–46.0)
Hemoglobin: 8.4 g/dL — ABNORMAL LOW (ref 12.0–15.0)
MCH: 29.8 pg (ref 26.0–34.0)
MCHC: 32.6 g/dL (ref 30.0–36.0)
MCV: 91.5 fL (ref 80.0–100.0)
Platelets: 108 10*3/uL — ABNORMAL LOW (ref 150–400)
RBC: 2.82 MIL/uL — ABNORMAL LOW (ref 3.87–5.11)
RDW: 20.3 % — ABNORMAL HIGH (ref 11.5–15.5)
WBC: 18.5 10*3/uL — ABNORMAL HIGH (ref 4.0–10.5)
nRBC: 1 % — ABNORMAL HIGH (ref 0.0–0.2)

## 2020-07-09 LAB — GLUCOSE, CAPILLARY
Glucose-Capillary: 90 mg/dL (ref 70–99)
Glucose-Capillary: 89 mg/dL (ref 70–99)
Glucose-Capillary: 107 mg/dL — ABNORMAL HIGH (ref 70–99)
Glucose-Capillary: 44 mg/dL — CL (ref 70–99)
Glucose-Capillary: 93 mg/dL (ref 70–99)
Glucose-Capillary: 58 mg/dL — ABNORMAL LOW (ref 70–99)

## 2020-07-09 LAB — MAGNESIUM: Magnesium: 2.1 mg/dL (ref 1.7–2.4)

## 2020-07-09 LAB — RETICULOCYTES
Immature Retic Fract: 34.7 % — ABNORMAL HIGH (ref 2.3–15.9)
RBC.: 2.76 MIL/uL — ABNORMAL LOW (ref 3.87–5.11)
Retic Count, Absolute: 154.3 10*3/uL (ref 19.0–186.0)
Retic Ct Pct: 5.6 % — ABNORMAL HIGH (ref 0.4–3.1)

## 2020-07-09 LAB — TRIGLYCERIDES: Triglycerides: 202 mg/dL — ABNORMAL HIGH (ref <150)

## 2020-07-09 LAB — LACTATE DEHYDROGENASE: LDH: 501 U/L — ABNORMAL HIGH (ref 98–192)

## 2020-07-09 LAB — PHOSPHORUS: Phosphorus: 3.9 mg/dL (ref 2.5–4.6)

## 2020-07-09 LAB — SODIUM: Sodium: 145 mmol/L (ref 135–145)

## 2020-07-09 MED ORDER — SODIUM CHLORIDE 0.9 % IV BOLUS
500.0000 mL | Freq: Once | INTRAVENOUS | Status: AC
  Administered 2020-07-09: 18:00:00 500 mL via INTRAVENOUS

## 2020-07-09 MED ORDER — VITAL HIGH PROTEIN PO LIQD
1000.0000 mL | ORAL | Status: DC
  Administered 2020-07-09: 20:00:00 1000 mL
  Filled 2020-07-09: qty 1000

## 2020-07-09 MED ORDER — DEXTROSE 50 % IV SOLN
25.0000 g | INTRAVENOUS | Status: AC
  Administered 2020-07-09: 21:00:00 25 g via INTRAVENOUS

## 2020-07-09 MED ORDER — ALBUMIN HUMAN 25 % IV SOLN
25.0000 g | Freq: Once | INTRAVENOUS | Status: AC
  Administered 2020-07-09: 18:00:00 25 g via INTRAVENOUS
  Filled 2020-07-09: qty 100

## 2020-07-09 MED ORDER — IOHEXOL 300 MG/ML  SOLN
100.0000 mL | Freq: Once | INTRAMUSCULAR | Status: AC | PRN
  Administered 2020-07-09: 100 mL via INTRAVENOUS

## 2020-07-09 MED ORDER — DEXTROSE IN LACTATED RINGERS 5 % IV SOLN: INTRAVENOUS | Status: DC

## 2020-07-09 MED ORDER — LORAZEPAM 2 MG/ML IJ SOLN: 2.0000 mg | Freq: Once | INTRAMUSCULAR | Status: AC

## 2020-07-09 MED ORDER — DEXTROSE 50 % IV SOLN
INTRAVENOUS | Status: AC
  Filled 2020-07-09: qty 50

## 2020-07-09 MED ORDER — DEXMEDETOMIDINE HCL IN NACL 400 MCG/100ML IV SOLN
0.0000 ug/kg/h | INTRAVENOUS | Status: DC
  Administered 2020-07-09: 13:00:00 0.3 ug/kg/h via INTRAVENOUS
  Filled 2020-07-09: qty 100

## 2020-07-09 MED ORDER — LORAZEPAM 2 MG/ML IJ SOLN
INTRAMUSCULAR | Status: AC
  Administered 2020-07-09: 14:00:00 2 mg via INTRAVENOUS
  Filled 2020-07-09: qty 1

## 2020-07-09 MED ORDER — SODIUM CHLORIDE 0.9 % IV SOLN: INTRAVENOUS | Status: DC

## 2020-07-09 MED ORDER — ROCURONIUM BROMIDE 10 MG/ML (PF) SYRINGE
PREFILLED_SYRINGE | INTRAVENOUS | Status: AC
  Filled 2020-07-09: qty 10

## 2020-07-09 MED ORDER — ROCURONIUM BROMIDE 50 MG/5ML IV SOLN
40.0000 mg | Freq: Once | INTRAVENOUS | Status: AC
  Administered 2020-07-09: 14:00:00 50 mg via INTRAVENOUS

## 2020-07-09 MED ORDER — PROSOURCE TF PO LIQD
45.0000 mL | Freq: Two times a day (BID) | ORAL | Status: DC
  Administered 2020-07-09: 22:00:00 45 mL
  Filled 2020-07-09: qty 45

## 2020-07-09 NOTE — Consult Note (Signed)
[address]       [place] [address]             [id number]        Terri Oneal [facility] Medical Record #[id number] Date of Birth: Oct 11, 1980  Referring: No ref. provider found Primary Care: Patient, No Pcp Per Primary Cardiologist:No primary care provider on file.  Chief Complaint:    Chief Complaint  Patient presents with  . Code Stroke    History of Present Illness:     39 year old female admitted to the hospital after suffering a large R MCA stroke.  She has current hx of IV drug abuse, and when she presented to the hospital, a code stroke was activated due to left hemiparesis and a rightward gaze.  She was also noted to be septic and had enterococcus growing from her blood cultures.  Echocardiogram was concerning for aortic valve vegetation, and severe AI.  She was intubated last night for tachypnea, and increased work of breathing.  Per nursing, she was alert, and oriented prior to intubation.  She underwent a decompressive craniotomy last night due to concern for potential herniation.  TEE today confirms the aortic valve vegetation, and severe AI.  CTS was consulted to assisted with management.    Past Medical History:  Diagnosis Date  . Anxiety and depression   . 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 Hypothyroid   . IVDU (intravenous drug user) 07/09/2020  . Migraine   . MVP (mitral valve prolapse)   . Opioid abuse, in remission ([place])   . Polysubstance abuse ([place])   . Septic embolism ([place]) 07/09/2020  . 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 CRANIOTOMY N/A 07/22/2020   Procedure: RIGHT DECOMPRESSIVE HEMI CRANIECTOMY;  Surgeon: Consuella Lose, MD;  Location: [facility];  Service: Neurosurgery;  Laterality: N/A;  . KNEE ARTHROPLASTY    . KNEE SURGERY    . MOUTH SURGERY        Social History   Tobacco Use  Smoking Status Current Every Day Smoker  . Packs/day: 1.00  . Types: Cigarettes   Smokeless Tobacco Never Used    Social History   Substance and Sexual Activity  Alcohol Use Yes     No Known Allergies   Current Facility-Administered Medications  Medication Dose Route Frequency Provider Last Rate Last Admin  . 0.9 %  sodium chloride infusion   Intravenous Continuous Rosalin Hawking, MD 50 mL/hr at 07/09/20 1000 Infusion Verify at 07/09/20 1000  . 0.9 %  sodium chloride infusion   Intravenous PRN Laurin Coder, MD   Paused at 06/25/2020 1758  . 0.9 %  sodium chloride infusion   Intravenous Continuous Darreld Mclean, PA-C      . acetaminophen (TYLENOL) 160 MG/5ML solution 650 mg  650 mg Per Tube Q4H PRN Stretch, Marily Lente, MD   650 mg at 07/09/20 1240  . ampicillin (OMNIPEN) 2 g in sodium chloride 0.9 % 100 mL IVPB  2 g Intravenous Q4H Tommy Medal, Lavell Islam, MD 300 mL/hr at 07/09/20 1242 2 g at 07/09/20 1242  . cefTRIAXone (ROCEPHIN) 2 g in sodium chloride 0.9 % 100 mL IVPB  2 g Intravenous Q12H Tommy Medal, Lavell Islam, MD 200 mL/hr at 07/09/20 0500 Infusion Verify at 07/09/20 0500  . chlorhexidine gluconate (MEDLINE KIT) (PERIDEX) 0.12 % solution 15 mL  15 mL Mouth Rinse BID Olalere, Adewale A, MD   15  mL at 07/09/20 0855  . Chlorhexidine Gluconate Cloth 2 % PADS 6 each  6 each Topical Q0600 Olalere, Adewale A, MD      . Chlorhexidine Gluconate Cloth 2 % PADS 6 each  6 each Topical Daily Olalere, Adewale A, MD   6 each at 07/09/20 0525  . dexmedetomidine (PRECEDEX) 400 MCG/100ML (4 mcg/mL) infusion  0-1.2 mcg/kg/hr Intravenous Continuous Theotis Barrio, MD 4.07 mL/hr at 07/09/20 1247 0.3 mcg/kg/hr at 07/09/20 1247  . docusate (COLACE) 50 MG/5ML liquid 100 mg  100 mg Per Tube BID Olalere, Adewale A, MD   100 mg at 07/09/20 1056  . fentaNYL (SUBLIMAZE) injection 25-50 mcg  25-50 mcg Intravenous Q2H PRN Karl Ito, MD   50 mcg at 07/12/2020 1459  . fentaNYL (SUBLIMAZE) injection 50 mcg  50 mcg Intravenous Q15 min PRN Olalere, Adewale A, MD      . fentaNYL (SUBLIMAZE)  injection 50-200 mcg  50-200 mcg Intravenous Q30 min PRN Olalere, Adewale A, MD   100 mcg at 07/09/20 1330  . heparin injection 5,000 Units  5,000 Units Subcutaneous Q8H Lynnell Catalan, MD   5,000 Units at 07/09/20 0607  . MEDLINE mouth rinse  15 mL Mouth Rinse 10 times per day Olalere, Adewale A, MD   15 mL at 07/09/20 1242  . mupirocin ointment (BACTROBAN) 2 % 1 application  1 application Nasal BID Olalere, Adewale A, MD   1 application at 07/09/20 1057  . ondansetron (ZOFRAN) injection 4 mg  4 mg Intravenous Q6H PRN Agarwala, Daleen Bo, MD      . pantoprazole (PROTONIX) injection 40 mg  40 mg Intravenous Q24H Marvel Plan, MD   40 mg at 07/09/20 1056  . phenylephrine (NEOSYNEPHRINE) 10-0.9 MG/250ML-% infusion  0-200 mcg/min Intravenous Titrated Stretch, Marveen Reeks, MD 37.5 mL/hr at 07/09/20 1000 25 mcg/min at 07/09/20 1000  . polyethylene glycol (MIRALAX / GLYCOLAX) packet 17 g  17 g Per Tube Daily Olalere, Adewale A, MD   17 g at 07/09/20 1056  . propofol (DIPRIVAN) 1000 MG/100ML infusion  0-50 mcg/kg/min Intravenous Continuous Olalere, Adewale A, MD 13.03 mL/hr at 07/09/20 1339 40 mcg/kg/min at 07/09/20 1339  . sodium chloride flush (NS) 0.9 % injection 10-40 mL  10-40 mL Intracatheter Q12H Agarwala, Ravi, MD   10 mL at 07/09/20 1057  . sodium chloride flush (NS) 0.9 % injection 10-40 mL  10-40 mL Intracatheter PRN Lynnell Catalan, MD        Medications Prior to Admission  Medication Sig Dispense Refill Last Dose  . ibuprofen (ADVIL) 200 MG tablet Take 600-800 mg by mouth every 6 (six) hours as needed for headache or mild pain.   07/06/2020 at Unknown time    Family History  Problem Relation Age of Onset  . Diabetes Other   . Hypertension Other   . Thyroid disease Other      Review of Systems:   Review of Systems  Unable to perform ROS: Intubated      Physical Exam: BP (!) 89/46   Pulse (!) 102   Temp (!) 101.7 F (38.7 C) (Axillary)   Resp (!) 23   Ht 5\' 4"  (1.626 m)   Wt 54.3 kg    SpO2 96%   BMI 20.55 kg/m  Physical Exam Constitutional:      Comments: sedated  Cardiovascular:     Rate and Rhythm: Tachycardia present.     Heart sounds: Murmur heard.    Pulmonary:     Comments: Vent Mode:  PRVC FiO2 (%):  (40 %-100 %) 60 % Set Rate:  (16 bmp) 16 bmp Vt Set:  (430 mL) 430 mL PEEP:  (8 cmH20) 8 cmH20 Plateau Pressure:  (23 cmH20-27 cmH20) 24 cmH20  Abdominal:     General: There is distension.       Diagnostic Studies & Laboratory data:    Echo: Large aortic valve vegetation, with severe AI.  Preserved LV and RV function.  Moderate MR   I have independently reviewed the above radiologic studies and discussed with the patient   Recent Lab Findings: Lab Results  Component Value Date   WBC 18.5 (H) 07/09/2020   HGB 8.2 (L) 07/09/2020   HCT 24.0 (L) 07/09/2020   PLT 108 (L) 07/09/2020   GLUCOSE 94 07/09/2020   CHOL 102 07/02/2020   TRIG 202 (H) 07/09/2020   HDL <10 (L) 07/24/2020   LDLCALC NOT CALCULATED 07/10/2020   ALT 15 07/09/2020   AST 34 07/09/2020   NA 145 07/09/2020   K 3.5 07/09/2020   CL 116 (H) 07/09/2020   CREATININE 0.85 07/09/2020   BUN 20 07/09/2020   CO2 19 (L) 07/09/2020   TSH 3.537 03/29/2017   INR 1.5 (H) 07/23/2020   HGBA1C 4.5 (L) 07/10/2020      Assessment / Plan:   39 yo female with a hx of IV drug abuse, E. Faecalis bacteremia, aortic valve endocarditis, septic emboli resulting in a R MCA stroke with left hemiparesis.  She is also s/p a hemicraniectomy for impending herniation.  Based on the echocardiogram, she will require an aortic valve replacement, but her neuro status is prohibitive.  Despite being decompressed, she still would be a very high risk of hemorrhagic conversion with systemic heparinzation for valve replacement.  For now, we will hold off on any surgical plans until her neuro prognosis is more defined.       I  spent 40 minutes counseling the patient face to face.   Lajuana Matte 07/09/2020 3:09 PM

## 2020-07-09 NOTE — Progress Notes (Signed)
Patient consistently not meeting BP goals or running border line with MAP in the 50s. Communicated this change with Dr. Benjamin Stain. Neo started.  Benson Norway, RN

## 2020-07-09 NOTE — Progress Notes (Signed)
OT Cancellation/Discharge Note  Patient Details Name: Terri Oneal MRN: [id number] DOB: [birth year]   Cancelled Treatment:    Reason Eval/Treat Not Completed: Patient not medically ready.  Spoke with NP, will sign off at this time as pt not medically appropriate.   Please reorder if she becomes appropriate.  Eber Jones., OTR/L Acute Rehabilitation Services Pager [phone] Office [phone]   Jeani Hawking M 07/09/2020, 11:50 AM

## 2020-07-09 NOTE — Progress Notes (Signed)
PT Cancellation Note  Patient Details Name: Terri Oneal MRN: [id number] DOB: 08-08-1980   Cancelled Treatment:    Reason Eval/Treat Not Completed: Medical issues which prohibited therapy.  RN asks to hold today. 07/09/2020  Jacinto Halim., PT Acute Rehabilitation Services [phone]  (pager) [phone]  (office)   [name] 07/09/2020, 11:54 AM

## 2020-07-09 NOTE — Progress Notes (Signed)
STROKE TEAM PROGRESS NOTE   INTERVAL HISTORY Terri Oneal at bedside.  Patient intubated, on ventilation, not open eyes on voice, withdrawal on right side with pain supination.  Had decompressive hemicraniectomy yesterday with Dr. Conchita Paris.  Repeat CT showed no midline shift.  2D echo and TEE showed large vegetation at AV, consistent with endocarditis.  Cardiovascular surgery consulted, not a candidate for surgery at this time.  OBJECTIVE Vitals:   07/09/20 0730 07/09/20 0745 07/09/20 0800 07/09/20 0815  BP:   (!) 94/48   Pulse: (!) 106 (!) 110 (!) 104 (!) 104  Resp: (!) 25 (!) 23 (!) 25 (!) 22  Temp:   99.7 F (37.6 C)   TempSrc:   Axillary   SpO2: 95% 93% 94% 97%  Weight:      Height:       CBC:  Recent Labs  Lab 06/30/2020 2136 07/22/2020 2210 06/26/2020 2117 07/09/20 0309 07/09/20 0757  WBC 19.9*   < > 24.5* 18.5*  --   NEUTROABS 17.7*  --   --   --   --   HGB 8.0*   < > 8.7* 8.4* 8.2*  HCT 26.9*   < > 28.6* 25.8* 24.0*  MCV 95.4   < > 94.4 91.5  --   PLT 103*   < > 115* 108*  --    < > = values in this interval not displayed.   Basic Metabolic Panel:  Recent Labs  Lab 07/20/2020 0757 07/02/2020 1021 07/09/20 0309 07/09/20 0757  NA 138   < > 146* 147*  K 3.8   < > 3.7 3.5  CL 106  --  116*  --   CO2 21*  --  19*  --   GLUCOSE 93  --  94  --   BUN 19  --  20  --   CREATININE 0.77  --  0.85  --   CALCIUM 7.5*  --  7.6*  --   MG  --   --  2.1  --   PHOS  --   --  3.9  --    < > = values in this interval not displayed.   Lipid Panel:     Component Value Date/Time   CHOL 102 07/23/2020 0757   TRIG 202 (H) 07/09/2020 0309   HDL <10 (L) 06/30/2020 0757   CHOLHDL NOT CALCULATED 07/19/2020 0757   VLDL 38 06/25/2020 0757   LDLCALC NOT CALCULATED 06/30/2020 0757   HgbA1c:  Lab Results  Component Value Date   HGBA1C 4.5 (L) 07/06/2020   Urine Drug Screen:     Component Value Date/Time   LABOPIA NONE DETECTED 07/22/2020 0049   COCAINSCRNUR NONE DETECTED 07/14/2020 0049    LABBENZ NONE DETECTED 07/16/2020 0049   AMPHETMU NONE DETECTED 07/05/2020 0049   THCU NONE DETECTED 06/25/2020 0049   LABBARB NONE DETECTED 07/16/2020 0049    Alcohol Level     Component Value Date/Time   ETH <10 07/22/2020 2239    IMAGING  CT HEAD CODE STROKE WO CONTRAST 06/24/2020 IMPRESSION:  1. Acute/subacute right MCA territory infarct. No intracranial hemorrhage.  2. ASPECTS is 0   CT Code Stroke CTA Head W/WO contrast CT Code Stroke CTA Neck W/WO contrast 07/16/2020 IMPRESSION:  1. Occlusion of the proximal right M1 segment with limited collateralization within the anterior right MCA territory.  2. Large right MCA territory infarct with previously reported ASPECTS of 0.  3. Small pleural effusions with areas of right apical consolidation.  CT ABDOMEN PELVIS WO CONTRAST 07-12-20 IMPRESSION:  1. Complex free fluid in the right pericolic gutter, as well as in the pelvis, suspicious for hemoperitoneum. Etiology is uncertain, however there is suggestion of an ovoid high density structure in the right adnexa spanning 5.1 cm, that may represent a hemorrhagic cyst.  2. Splenomegaly. Multiple areas of low-density within the spleen suspicious for infarcts. There is a dominant cystic structure superiorly spanning 8.3 cm, indeterminate for additional infarct, cyst, or abscess.  3. Right pleural effusion. Patchy and ground-glass opacities in both lung bases, also confluent dependently. Findings may be secondary to pulmonary edema, pneumonia, or combination thereof.  4. Generalized edema of the subcutaneous and intra-abdominal fat.  5. Contrast within both renal collecting systems from prior neck CTA. There is suggestion of mild striated nephrograms, which can be seen with urinary tract infection.  6. Patient may benefit from contrast enhanced exam to further delineate. If she is hemodynamically stable, recommend trending of renal function, and follow-up CT could be performed in the  next day.  CT HEAD WO CONTRAST 07/02/2020 IMPRESSION:  1. Extensive Right MCA territory infarct with cytotoxic edema and mild intracranial mass effect not significantly changed from 2138 hours yesterday. Possible petechial hemorrhage, but no malignant hemorrhagic transformation.  2. Stable trace leftward midline shift. Basilar cisterns remain patent.  3. Other vascular territories remain within normal limits.   CT CEREBRAL PERFUSION W CONTRAST 07/10/2020   ADDENDUM REPORT: 07/16/2020 00:10 ADDENDUM: The above numbers refer strictly to the computed data. Given the large amount of hypoattenuation demonstrated on the pre contrast head CT, the degree of core infarct is likely underestimated by the RAPID software. This is most evident at the supraganglionic level and in the anterior ganglionic level zones. However, it is not possible to accurately quantify the degree to which the calculated core infarct is underestimated. The calculated area of Tmax>6.0s appears to more closely correspond to the hypodense region on the earlier noncontrast CT than does the area of CBF<30%. I suspect the greatest area of penumbra is in the posterior right MCA territory. IMPRESSION:  35 mL core infarct in the right MCA territory with 110 mL surrounding ischemic penumbra.   CT ABDOMEN PELVIS WO CONTRAST CT ABDOMEN PELVIS WO CONTRAST CT CHEST ABDOMEN PELVIS W CONTRAST 07/17/2020 1. Small-to-moderate volume hyperdense fluid predominantly within the right pericolic gutter and pelvis suggestive of hemoperitoneum, similar in volume to the prior study. Etiology remains uncertain on non-contrasted exam. 2. No significant interval change in size and appearance of large somewhat ill-defined area of fluid density within the upper pole of the spleen measuring up to 9.1 cm, which may reflect splenic infarct, cyst, or possibly abscess. Additional areas of low density within the mid and inferior portions of the spleen with a more  wedge-shaped configuration are similar to the prior study and are more likely to reflect splenic infarcts. 3. Redemonstration of a ovoid hyperdense structure within the right adnexal region measuring 5.0 x 3.1 cm, possibly large hyperdense adnexal cystic lesion. Recommend pelvic ultrasound for further evaluation. 4. Small right pleural effusion, similar to slightly progressed from yesterday's study. Increasing trace left pleural effusion. Dependent bibasilar atelectasis and patchy ground-glass opacities within the visualized lung bases.   CT HEAD WO CONTRAST 07/16/2020 Evolving large right MCA territory infarction with possible petechial hemorrhage. No discrete hematoma. No significant mass effect at this time.   CT HEAD WO CONTRAST 07/09/2020 Evolving large right MCA territory infarct status post decompression. No hemorrhagic transformation or midline  shift.   DG Chest Portable 1 View 07/05/2020 1. Interval intubation. Endotracheal tube terminates 2 cm above the carina. 2. Right IJ line is stable. 3. Low lung volumes and diffuse interstitial and airspace opacities bilaterally likely representing edema.  07/23/2020 1. Right IJ approach central line placed, tip at the lower SVC level. No adverse features. 2. Continued extensive perihilar opacity and indistinct vasculature favoring acute pulmonary edema.  07/09/2020 Cardiomegaly with findings of CHF. Pneumonia is not excluded.   Transthoracic Echocardiogram  07/06/2020 1. Left ventricular ejection fraction, by estimation, is 60 to 65%. The left ventricle has normal function. The left ventricle has no regional wall motion abnormalities. Left ventricular diastolic function could not be evaluated.  2. Right ventricular systolic function is normal. The right ventricular size is normal.  3. The mitral valve is normal in structure. Mild mitral valve regurgitation. No evidence of mitral stenosis.  4. Tricuspid valve regurgitation is mild to moderate.      5. There is at least one mobile shaggy density on the ventricular side of the AV. The valve is not visualized well enough to discern which cusp it emanates from but possibly involves more than 1 cusp. There is also concern for perivalvular abcess with flow into a small echolucent space in the area of the right coronary cusp. There is at least moderate and possibly severe aortic regurgiation. This is consistent with endocarditis. Recommend TEE.  5. The inferior vena cava is normal in size with greater than 50% respiratory variability, suggesting right atrial pressure of 3 mmHg.   TEE 07/09/2020  1. Left ventricular ejection fraction, by estimation, is 50 to 55%. The left ventricle has low normal function.  2. Right ventricular systolic function is normal. The right ventricular size is normal.  3. No left atrial/left atrial appendage thrombus was detected.  4. Large pleural effusion in the left lateral region.  5. The mitral valve is grossly normal. Moderate mitral valve regurgitation.  6. Aortic valve vegetation is visualized on the right.  7. The aortic valve is bicuspid. There is a large vegetation on the right coronary cusp. There is severe Aortic insufficiency.. The aortic valve is bicuspid. Aortic valve regurgitation is severe.   ECG - ST rate 120 BPM. (See cardiology reading for complete details)   PHYSICAL EXAM  Temp:  [98.3 F (36.8 C)-102 F (38.9 C)] 101.7 F (38.7 C) (12/16 1200) Pulse Rate:  [83-135] 91 (12/16 1800) Resp:  [16-35] 33 (12/16 1800) BP: (79-137)/(41-61) 89/48 (12/16 1800) SpO2:  [90 %-100 %] 100 % (12/16 1800) Arterial Line BP: (101-161)/(39-53) 121/47 (12/16 1800) FiO2 (%):  [40 %-80 %] 60 % (12/16 1603)  General - Well nourished, well developed, intubated on sedation.  Ophthalmologic - fundi not visualized due to noncooperation.  Cardiovascular - Regular rhythm and intermittent tachycardia.  Neuro - intubated on sedation, eyes closed, not following commands.  With forced eye opening, eyes in mid position, not blinking to visual threat, doll's eyes present, not tracking, PERRL. Corneal reflex weakly present bilaterally, gag and cough present. Breathing over the vent.  Facial symmetry not able to test due to ET tube.  Tongue protrusion not cooperative. On pain stimulation, withdrawal of right upper and lower extremity, slight withdrawal of left lower extremity, no movement of left upper extremity. DTR 1+ and no babinski. Sensation, coordination and gait not tested.   ASSESSMENT/PLAN Terri Oneal is a 39 y.o. female with history of active IV heroin abuser with a history of anxiety, depression,  tobacco use, hypothyroidism, migraines and mitral valve prolapse who is brought in by EMS as a Code Stroke for acute onset of left hemiplegia with rightward gaze and left hemineglect. She did not receive IV t-PA due to late presentation (>4.5 hours from time of onset). Not an IR candidate based on perfusion images.  Stroke: large Rt MCA infarct - septic emboli due to endocarditis   CT Head - Acute/subacute right MCA territory infarct. No intracranial hemorrhage. ASPECTS is 0   CTA H&N - Occlusion of the proximal right M1 segment with limited collateralization within the anterior right MCA territory.   CTP - 35 mL core infarct in the right MCA territory with 110 mL surrounding ischemic penumbra. (see full report above)  CT head - 12/15 - Extensive Right MCA territory infarct with cytotoxic edema and mild intracranial mass effect not significantly changed from 2138 hours yesterday.   2D Echo - EF 60-65%. Mobile shaggy density AV. Moderate to severe AR.  TEE EF 50-55%, large L pleural effusion. AV large vegetation on R coronary cusp. Severe aortic insufficiency and regurgitation. No PFO  [employer] Corporation[employer] Corona Virus 2 - negative  LDL - pending   HgbA1c - 4.5  UDS - negative  VTE prophylaxis - SCDsheparin  No antithrombotic prior to admission, now on no  antithrombotics due to anemia needing PRBC and endocarditis as etiology of stroke  Therapy recommendations:  reconsult when pt able to tolerate  Disposition:  Pending  Cerebral edema Chronic hyponatremia  CT x 3 large right MCA infarct with mass effect  S/p decompressive hemicraniectomy  Na 130->138->140->146->147->145  3% saline @ 50-> NS @ 50  Given chronic hyponatremia will be careful for Na rise, no more than 12mEq increase in 24h  Na monitoring  Acute blood loss anemia Abdominal mass vs. Hemorrhagic cyst hemoperitoneum  Hb 8.0->8.6->6.5->6.1->8.4->8.2  CT A/P x 2 showed possible hemoperitoneum, spleen infarct vs. Abscess, large adnexal mass  PRBC transfusion  Albumin   CBC monitoring  NSG on board  Sepsis Large AV endocarditis Septic emboli (lung, spleen, SMA, adnexa)  Tachycardia HR 120s with soft BP  Tmax 100.4->101.7  WBC 19.9->16.6->20.7->18.5  LA 3.2->1.6  CT C/A/P Spleen infarct vs. Abscess, bilateral lung septic emboli, SMA 7 mm aneurysm, right adnexa hyperdense lesion  IVDU  2D Echo - EF 60-65%. Mobile shaggy density AV. Moderate to severe AR.  TEE EF 50-55%, large L pleural effusion. AV vegetation on R coronary cusp. Severe aortic insufficiency and regurgitation.   Blood culture enterococcus faecalis  On cefepime and vanco -> Rocephin and ampicillin  CTS consult ([place]) no surgical plans until neuro prognosis more defined  UTI  UA WBC 6-10  UCx >100k ecoli  On cefepime and vanco -> Rocephin and ampicillin  Respiratory failure  Intubated  On vent  On propofol/Neo/Precedex  CCM on board  Tobacco abuse  Current smoker  Smoking cessation counseling will be provided  IVDU  Addicted to heroin  Cessation counseling will be provided  Dysphagia . Secondary to stroke . NPO . Cortrak w/ TF @ 40 and IV fluid at 100 . Speech on board  Other Stroke Risk Factors  ETOH use, advised to drink no more than 1 alcoholic  beverage per day.  Migraines  Other Active Problems, Findings and Recommendations  Code status - Full code  Thrombocytopenia - platelets - 103->83->108  MRSA nasal swab +  Hospital day # 2  This patient is critically ill due to right large MCA stroke, status post decompressive hemicraniectomy  due to cerebral edema, sepsis, septic emboli, septic shock, respiratory failure, severe anemia needing blood transfusion, severe endocarditis and at significant risk of neurological worsening, death form septic shock, heart failure, brain herniation, hemorrhagic conversion, seizure. This patient's care requires constant monitoring of vital signs, hemodynamics, respiratory and cardiac monitoring, review of multiple databases, neurological assessment, discussion with family, other specialists and medical decision making of high complexity. I spent 50 minutes of neurocritical care time in the care of this patient. I had long discussion with Terri Oneal at bedside, updated pt current condition, treatment plan and potential prognosis, and answered all the questions.  She expressed understanding and appreciation.    Marvel Plan, MD PhD Stroke Neurology 07/09/2020 8:56 AM  To contact Stroke Continuity provider, please refer to [email or web address]. After hours, contact General Neurology

## 2020-07-09 NOTE — Progress Notes (Signed)
   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  After careful review of history and examination, the risks and benefits of transesophageal echocardiogram have been explained including risks of esophageal damage, perforation (1:10,000 risk), bleeding, pharyngeal hematoma as well as other potential complications associated with conscious sedation including aspiration, arrhythmia, respiratory failure and death. Alternatives to treatment were discussed, questions were answered. Patient is currently intubated and sedated and cannot consent. Discussed above with mother - Seraphim Affinito - who agrees to proceed.  Corrin Parker, PA-C 07/09/2020 10:56 AM

## 2020-07-09 NOTE — Progress Notes (Addendum)
SLP Cancellation Note  Patient Details Name: Terri Oneal MRN: [id number] DOB: 1980-10-20   Cancelled treatment:        Attempted to see pt for swallowing evaluation.  Pt is presently intubated s/p decompressive hemicraniotomy.  Briefly spoke with MD and pt will likely not be ready for extubation imminently. SLP will sign off at present.  Please reconsult when pt is medically appropriate for PO trials and cognitive linguistic assessment.  Kerrie Pleasure, MA, CCC-SLP Acute Rehabilitation Services Office: [phone]  07/09/2020, 9:25 AM

## 2020-07-09 NOTE — Progress Notes (Signed)
Updated mother on patient's current status   Discussed with neurology   Patient will be going for CT head and follow-up of craniectomy  CT abdomen and pelvis is ordered to evaluate significant increase in girth, abdominal distention with tenderness this morning  CT chest to assess for septic emboli-valve abscess/vegetation present

## 2020-07-09 NOTE — Progress Notes (Signed)
eLink Physician-Brief Progress Note Patient Name: SYNIA DOUGLASS DOB: 12-05-1980 MRN: [id number]   Date of Service  07/09/2020  HPI/Events of Note  RN requests UA with reflex. Foul urine.  eICU Interventions  UA with reflex ordered.     Intervention Category Minor Interventions: Clinical assessment - ordering diagnostic tests  Janae Bridgeman 07/09/2020, 6:31 AM

## 2020-07-09 NOTE — CV Procedure (Signed)
    Transesophageal Echocardiogram Note  Terri Oneal [id number] 11-Aug-1980  Procedure: Transesophageal Echocardiogram Indications: aortic valve vegetation   Procedure Details Consent: Obtained Time Out: Verified patient identification, verified procedure, site/side was marked, verified correct patient position, special equipment/implants available, Radiology Safety Procedures followed,  medications/allergies/relevent history reviewed, required imaging and test results available.  Performed  Medications:  During this procedure the patient is administered a  Propofol 70 mg iv, fentanyl 200 mcg IV, Roxicuron 50 mg iv, ativan 2 mg IV  sedation.   We had the assistance of the critical care team for sedation , paralytics.     The patient's heart rate, blood pressure, and oxygen saturation are monitored continuously during the procedure. The period of conscious sedation is 60  minutes, of which I was present face-to-face 100% of this time.  Left Ventrical:  Low normal LV function - EF 50-55%  Mitral Valve: structurally normal,  Moderate MR.   There is no reversal of flow in the PV  Aortic Valve: bicuspid AV.  There is a large vegetation on the Right coronary cups.   Severe AI,   Tricuspid Valve: trace TR,  Structurally normal   Pulmonic Valve: normal   Left Atrium/ Left atrial appendage:  No thrombi   Atrial septum: intact visually   Aorta: normal    Complications: No apparent complications Patient did tolerate procedure well.   [name], Montez Hageman., MD, [facility] 07/09/2020, 2:11 PM

## 2020-07-09 NOTE — Progress Notes (Signed)
Called and alerted Gretchin at [facility] that patient's CBG dropped below 60 for a second time despite administering an whole ampule of D50.  Will await new orders.

## 2020-07-09 NOTE — Progress Notes (Signed)
Subjective:  on the ventilator   Antibiotics:  Anti-infectives (From admission, onward)   Start     Dose/Rate Route Frequency Ordered Stop   07/09/2020 1700  cefTRIAXone (ROCEPHIN) 2 g in sodium chloride 0.9 % 100 mL IVPB        2 g 200 mL/hr over 30 Minutes Intravenous Every 12 hours 07/06/2020 1405     07/11/2020 1600  ampicillin (OMNIPEN) 2 g in sodium chloride 0.9 % 100 mL IVPB        2 g 300 mL/hr over 20 Minutes Intravenous Every 4 hours 07/19/2020 1405     07/15/2020 1545  ceFAZolin (ANCEF) IVPB 2g/100 mL premix  Status:  Discontinued        2 g 200 mL/hr over 30 Minutes Intravenous On call to O.R. 06/26/2020 1445 07/05/2020 1447   07/22/2020 0830  ceFEPIme (MAXIPIME) 2 g in sodium chloride 0.9 % 100 mL IVPB  Status:  Discontinued        2 g 200 mL/hr over 30 Minutes Intravenous Every 8 hours 07/07/2020 0720 06/28/2020 1405   07/09/2020 0800  vancomycin (VANCOCIN) IVPB 1000 mg/200 mL premix  Status:  Discontinued        1,000 mg 200 mL/hr over 60 Minutes Intravenous Every 8 hours 07/19/2020 2339 06/24/2020 1405   07/07/2020 0800  piperacillin-tazobactam (ZOSYN) IVPB 3.375 g  Status:  Discontinued        3.375 g 12.5 mL/hr over 240 Minutes Intravenous Every 8 hours 07/12/2020 2339 07/17/2020 0720   06/24/2020 2345  vancomycin (VANCOREADY) IVPB 1250 mg/250 mL        1,250 mg 166.7 mL/hr over 90 Minutes Intravenous STAT 06/27/2020 2335 06/29/2020 0331   07/16/2020 2345  piperacillin-tazobactam (ZOSYN) IVPB 3.375 g        3.375 g 100 mL/hr over 30 Minutes Intravenous  Once 07/04/2020 2336 06/29/2020 0036   07/23/2020 2330  ceFEPIme (MAXIPIME) 2 g in sodium chloride 0.9 % 100 mL IVPB  Status:  Discontinued        2 g 200 mL/hr over 30 Minutes Intravenous  Once 06/24/2020 2327 07/24/2020 2336   07/06/2020 2330  metroNIDAZOLE (FLAGYL) IVPB 500 mg  Status:  Discontinued        500 mg 100 mL/hr over 60 Minutes Intravenous  Once 07/06/2020 2327 07/03/2020 2336   07/21/2020 2330  vancomycin (VANCOCIN) IVPB 1000 mg/200 mL  premix  Status:  Discontinued        1,000 mg 200 mL/hr over 60 Minutes Intravenous  Once 07/06/2020 2327 07/22/2020 2335      Medications: Scheduled Meds: . chlorhexidine gluconate (MEDLINE KIT)  15 mL Mouth Rinse BID  . Chlorhexidine Gluconate Cloth  6 each Topical Q0600  . Chlorhexidine Gluconate Cloth  6 each Topical Daily  . docusate  100 mg Per Tube BID  . heparin  5,000 Units Subcutaneous Q8H  . mouth rinse  15 mL Mouth Rinse 10 times per day  . mupirocin ointment  1 application Nasal BID  . pantoprazole (PROTONIX) IV  40 mg Intravenous Q24H  . polyethylene glycol  17 g Per Tube Daily  . sodium chloride flush  10-40 mL Intracatheter Q12H   Continuous Infusions: . sodium chloride 50 mL/hr at 07/09/20 0500  . sodium chloride Stopped (06/26/2020 1758)  . ampicillin (OMNIPEN) IV Stopped (07/09/20 0428)  . cefTRIAXone (ROCEPHIN)  IV 200 mL/hr at 07/09/20 0500  . dexmedetomidine (PRECEDEX) IV infusion    . phenylephrine (NEO-SYNEPHRINE) Adult  infusion 30 mcg/min (07/09/20 0629)  . propofol (DIPRIVAN) infusion 20 mcg/kg/min (07/09/20 0500)   PRN Meds:.sodium chloride, acetaminophen (TYLENOL) oral liquid 160 mg/5 mL, fentaNYL (SUBLIMAZE) injection, fentaNYL (SUBLIMAZE) injection, fentaNYL (SUBLIMAZE) injection, ondansetron (ZOFRAN) IV, sodium chloride flush    Objective: Weight change:   Intake/Output Summary (Last 24 hours) at 07/09/2020 1031 Last data filed at 07/09/2020 0600 Gross per 24 hour  Intake 3169.19 ml  Output 325 ml  Net 2844.19 ml   Blood pressure (!) 90/47, pulse (!) 102, temperature 99.7 F (37.6 C), temperature source Axillary, resp. rate (!) 22, height $RemoveBe'5\' 4"'YBHHdoFvl$  (1.626 m), weight 54.3 kg, SpO2 96 %. Temp:  [97.9 F (36.6 C)-102 F (38.9 C)] 99.7 F (37.6 C) (12/16 0800) Pulse Rate:  [102-137] 102 (12/16 0930) Resp:  [16-40] 22 (12/16 0930) BP: (89-137)/(41-60) 90/47 (12/16 0900) SpO2:  [90 %-100 %] 96 % (12/16 0930) Arterial Line BP: (110-163)/(38-62) 127/46  (12/16 0930) FiO2 (%):  [40 %-100 %] 40 % (12/16 0748)  Physical Exam: Physical Exam Constitutional:      Appearance: She is ill-appearing and diaphoretic.     Interventions: She is intubated.  HENT:     Head:   Cardiovascular:     Rate and Rhythm: Tachycardia present.     Heart sounds: Murmur heard.    Pulmonary:     Effort: She is intubated.  Abdominal:     Tenderness: There is abdominal tenderness.  Skin:    Coloration: Skin is pale.   Track marks  Sedated CBC:    BMET Recent Labs    07/15/2020 0757 07/10/2020 1021 07/09/20 0309 07/09/20 0757  NA 138   < > 146* 147*  K 3.8   < > 3.7 3.5  CL 106  --  116*  --   CO2 21*  --  19*  --   GLUCOSE 93  --  94  --   BUN 19  --  20  --   CREATININE 0.77  --  0.85  --   CALCIUM 7.5*  --  7.6*  --    < > = values in this interval not displayed.     Liver Panel  Recent Labs    07/04/2020 2136 07/09/20 0309  PROT 6.2* 4.7*  ALBUMIN 1.9* 1.7*  AST 42* 34  ALT 18 15  ALKPHOS 157* 102  BILITOT 3.5* 7.6*       Sedimentation Rate No results for input(s): ESRSEDRATE in the last 72 hours. C-Reactive Protein No results for input(s): CRP in the last 72 hours.  Micro Results: Recent Results (from the past 720 hour(s))  Resp Panel by RT-PCR (Flu A&B, Covid) Nasopharyngeal Swab     Status: None   Collection Time: 07/11/2020  9:53 PM   Specimen: Nasopharyngeal Swab; Nasopharyngeal(NP) swabs in vial transport medium  Result Value Ref Range Status   SARS Coronavirus 2 by RT PCR NEGATIVE NEGATIVE Final    Comment: (NOTE) SARS-CoV-2 target nucleic acids are NOT DETECTED.  The SARS-CoV-2 RNA is generally detectable in upper respiratory specimens during the acute phase of infection. The lowest concentration of SARS-CoV-2 viral copies this assay can detect is 138 copies/mL. A negative result does not preclude SARS-Cov-2 infection and should not be used as the sole basis for treatment or other patient management  decisions. A negative result may occur with  improper specimen collection/handling, submission of specimen other than nasopharyngeal swab, presence of viral mutation(s) within the areas targeted by this assay, and inadequate number of  viral copies(<138 copies/mL). A negative result must be combined with clinical observations, patient history, and epidemiological information. The expected result is Negative.  Fact Sheet for Patients:  EntrepreneurPulse.com.au  Fact Sheet for Healthcare Providers:  IncredibleEmployment.be  This test is no t yet approved or cleared by the [place] FDA and  has been authorized for detection and/or diagnosis of SARS-CoV-2 by FDA under an Emergency Use Authorization (EUA). This EUA will remain  in effect (meaning this test can be used) for the duration of the COVID-19 declaration under Section 564(b)(1) of the Act, 21 U.S.C.section 360bbb-3(b)(1), unless the authorization is terminated  or revoked sooner.       Influenza A by PCR NEGATIVE NEGATIVE Final   Influenza B by PCR NEGATIVE NEGATIVE Final    Comment: (NOTE) The Xpert Xpress SARS-CoV-2/FLU/RSV plus assay is intended as an aid in the diagnosis of influenza from Nasopharyngeal swab specimens and should not be used as a sole basis for treatment. Nasal washings and aspirates are unacceptable for Xpert Xpress SARS-CoV-2/FLU/RSV testing.  Fact Sheet for Patients: EntrepreneurPulse.com.au  Fact Sheet for Healthcare Providers: IncredibleEmployment.be  This test is not yet approved or cleared by the [place] FDA and has been authorized for detection and/or diagnosis of SARS-CoV-2 by FDA under an Emergency Use Authorization (EUA). This EUA will remain in effect (meaning this test can be used) for the duration of the COVID-19 declaration under Section 564(b)(1) of the Act, 21 U.S.C. section 360bbb-3(b)(1), unless the  authorization is terminated or revoked.  Performed at [facility], [place] [address] [place], [place] [address]   Culture, blood (single)     Status: Abnormal (Preliminary result)   Collection Time: 07/22/2020 11:40 PM   Specimen: BLOOD  Result Value Ref Range Status   Specimen Description BLOOD RIGHT ANTECUBITAL  Final   Special Requests   Final    BOTTLES DRAWN AEROBIC AND ANAEROBIC Blood Culture adequate volume   Culture  Setup Time   Final    GRAM POSITIVE COCCI IN CHAINS IN BOTH AEROBIC AND ANAEROBIC BOTTLES Organism ID to follow CRITICAL RESULT CALLED TO, READ BACK BY AND VERIFIED WITH: Ferne Coe PharmD 14:00 06/30/2020 ([email or web address])    Culture (A)  Final    ENTEROCOCCUS FAECALIS SUSCEPTIBILITIES TO FOLLOW Performed at [facility], [place] [address], [place], [place] [address]    Report Status PENDING  Incomplete  Culture, blood (routine x 2)     Status: Abnormal (Preliminary result)   Collection Time: 07/06/2020 11:40 PM   Specimen: BLOOD  Result Value Ref Range Status   Specimen Description BLOOD RIGHT BICEP  Final   Special Requests   Final    BOTTLES DRAWN AEROBIC AND ANAEROBIC Blood Culture adequate volume   Culture  Setup Time   Final    GRAM POSITIVE COCCI IN CHAINS IN BOTH AEROBIC AND ANAEROBIC BOTTLES IDENTIFICATION TO FOLLOW CRITICAL RESULT CALLED TO, READ BACK BY AND VERIFIED WITH: Ferne Coe PharmD 14:00 06/24/2020 ([email or web address]) Performed at [facility], [address] [place], [place] [address]    Culture ENTEROCOCCUS FAECALIS (A)  Final   Report Status PENDING  Incomplete  Blood Culture ID Panel (Reflexed)     Status: Abnormal   Collection Time: 07/16/2020 11:40 PM  Result Value Ref Range Status   Enterococcus faecalis DETECTED (A) NOT DETECTED Final    Comment: CRITICAL RESULT CALLED TO, READ BACK BY AND VERIFIED WITH: Ferne Coe PharmD 14:00 07/07/2020 ([email or web address])    Enterococcus Faecium  NOT DETECTED NOT DETECTED Final   Listeria monocytogenes NOT  DETECTED NOT DETECTED Final   Staphylococcus species NOT DETECTED NOT DETECTED Final   Staphylococcus aureus (BCID) NOT DETECTED NOT DETECTED Final   Staphylococcus epidermidis NOT DETECTED NOT DETECTED Final   Staphylococcus lugdunensis NOT DETECTED NOT DETECTED Final   Streptococcus species NOT DETECTED NOT DETECTED Final   Streptococcus agalactiae NOT DETECTED NOT DETECTED Final   Streptococcus pneumoniae NOT DETECTED NOT DETECTED Final   Streptococcus pyogenes NOT DETECTED NOT DETECTED Final   A.calcoaceticus-baumannii NOT DETECTED NOT DETECTED Final   Bacteroides fragilis NOT DETECTED NOT DETECTED Final   Enterobacterales NOT DETECTED NOT DETECTED Final   Enterobacter cloacae complex NOT DETECTED NOT DETECTED Final   Escherichia coli NOT DETECTED NOT DETECTED Final   Klebsiella aerogenes NOT DETECTED NOT DETECTED Final   Klebsiella oxytoca NOT DETECTED NOT DETECTED Final   Klebsiella pneumoniae NOT DETECTED NOT DETECTED Final   Proteus species NOT DETECTED NOT DETECTED Final   Salmonella species NOT DETECTED NOT DETECTED Final   Serratia marcescens NOT DETECTED NOT DETECTED Final   Haemophilus influenzae NOT DETECTED NOT DETECTED Final   Neisseria meningitidis NOT DETECTED NOT DETECTED Final   Pseudomonas aeruginosa NOT DETECTED NOT DETECTED Final   Stenotrophomonas maltophilia NOT DETECTED NOT DETECTED Final   Candida albicans NOT DETECTED NOT DETECTED Final   Candida auris NOT DETECTED NOT DETECTED Final   Candida glabrata NOT DETECTED NOT DETECTED Final   Candida krusei NOT DETECTED NOT DETECTED Final   Candida parapsilosis NOT DETECTED NOT DETECTED Final   Candida tropicalis NOT DETECTED NOT DETECTED Final   Cryptococcus neoformans/gattii NOT DETECTED NOT DETECTED Final   Vancomycin resistance NOT DETECTED NOT DETECTED Final    Comment: Performed at [facility], [address] [place], [place] [address]  Urine Culture     Status: None (Preliminary result)    Collection Time: 07/19/2020 12:36 AM   Specimen: Urine, Catheterized  Result Value Ref Range Status   Specimen Description URINE, CATHETERIZED  Final   Special Requests NONE  Final   Culture   Final    CULTURE REINCUBATED FOR BETTER GROWTH Performed at [facility], [address], [place], [place] [address]    Report Status PENDING  Incomplete  Surgical pcr screen     Status: Abnormal   Collection Time: 07/07/2020  2:24 PM   Specimen: Nasal Mucosa; Nasal Swab  Result Value Ref Range Status   MRSA, PCR NEGATIVE NEGATIVE Final   Staphylococcus aureus POSITIVE (A) NEGATIVE Final    Comment: (NOTE) The Xpert SA Assay (FDA approved for NASAL specimens in patients 47 years of age and older), is one component of a comprehensive surveillance program. It is not intended to diagnose infection nor to guide or monitor treatment. Performed at [facility], [address] [place], [place] [address]     Studies/Results: CT ABDOMEN PELVIS WO CONTRAST  Result Date: 06/27/2020 CLINICAL DATA:  Follow-up hemoperitoneum EXAM: CT ABDOMEN AND PELVIS WITHOUT CONTRAST TECHNIQUE: Multidetector CT imaging of the abdomen and pelvis was performed following the standard protocol without IV contrast. COMPARISON:  07/09/2020 FINDINGS: Lower chest: Small right pleural effusion, similar to slightly progressed from yesterday's study. Increasing trace left pleural effusion. Dependent bibasilar atelectasis and patchy ground-glass opacities within the visualized lung bases. Hepatobiliary: Unremarkable unenhanced appearance of the liver. No focal liver lesion identified. Excreted contrast within the gallbladder. Gallbladder is otherwise within normal limits. No hyperdense gallstone. No biliary dilatation.  Pancreas: Grossly unremarkable Spleen: Splenomegaly. Large somewhat ill-defined area of fluid density within the upper pole of the spleen measuring up to 9.1 cm is similar in size and appearance to the previous  study, remeasured. Additional areas of low density within the mid and inferior portions of the spleen with a more wedge-shaped configuration are similar to the prior study and may reflect splenic infarcts. Adrenals/Urinary Tract: Unremarkable adrenal glands. Small amount of excreted contrast within the kidneys and ureters. Kidneys are normal in size without focal lesion, stone, or hydronephrosis. Excreted contrast within the urinary bladder. Stomach/Bowel: Stomach and bowel are within normal limits. No dilated loops of bowel. No inflammatory changes are evident. Vascular/Lymphatic: No significant vascular findings evident on non contrasted exam. Evaluation of adenopathy is somewhat limited in the absence of intravenous contrast and relative paucity of intra-abdominal fat. No definite abdominopelvic lymphadenopathy. Reproductive: Uterus is grossly unremarkable. Redemonstration of a ovoid somewhat hyperdense structure within the right adnexal region measuring 5.0 x 3.1 cm (series 3, image 74), possibly large hyperdense adnexal cyst. Other: Small to moderate volume hyperdense fluid is again seen predominantly within the right pericolic gutter and pelvis, similar in volume to the prior study. No pneumoperitoneum. Musculoskeletal: Mild anasarca. No acute osseous finding. No suspicious bone lesion. No significant degenerative findings. IMPRESSION: 1. Small-to-moderate volume hyperdense fluid predominantly within the right pericolic gutter and pelvis suggestive of hemoperitoneum, similar in volume to the prior study. Etiology remains uncertain on non-contrasted exam. 2. No significant interval change in size and appearance of large somewhat ill-defined area of fluid density within the upper pole of the spleen measuring up to 9.1 cm, which may reflect splenic infarct, cyst, or possibly abscess. Additional areas of low density within the mid and inferior portions of the spleen with a more wedge-shaped configuration are  similar to the prior study and are more likely to reflect splenic infarcts. 3. Redemonstration of a ovoid hyperdense structure within the right adnexal region measuring 5.0 x 3.1 cm, possibly large hyperdense adnexal cystic lesion. Recommend pelvic ultrasound for further evaluation. 4. Small right pleural effusion, similar to slightly progressed from yesterday's study. Increasing trace left pleural effusion. Dependent bibasilar atelectasis and patchy ground-glass opacities within the visualized lung bases. Electronically Signed   By: Davina Poke D.O.   On: 06/29/2020 12:38   CT ABDOMEN PELVIS WO CONTRAST  Result Date: 06/24/2020 CLINICAL DATA:  Leukocytosis and abdominal pain. Stroke. EXAM: CT ABDOMEN AND PELVIS WITHOUT CONTRAST TECHNIQUE: Multidetector CT imaging of the abdomen and pelvis was performed following the standard protocol without IV contrast. COMPARISON:  None. FINDINGS: Lower chest: Right pleural effusion. There is basilar septal thickening. Patchy and ground-glass opacities in both lung bases, also confluent dependently. Hepatobiliary: No evidence of focal hepatic lesion, evaluation limited by lack of IV contrast. There are hyperdense blood products in the right pericolic gutter abutting the liver tip. Gallbladder appears present, no calcified gallstone. Pancreas: Grossly negative. Spleen: Enlarged spanning 13 x 7.3 x 12.7 cm. Multiple areas of low-density within the spleen, including dominant cystic structure superiorly measuring 8.3 cm. There are also wedge-shaped and peripheral defects inferiorly. There may be trace perisplenic fluid. Adrenals/Urinary Tract: No adrenal nodule. There is excreted IV contrast within both renal collecting systems from prior neck CTA. Nephrograms are slightly striated. There is excreted IV contrast in the urinary bladder. Stomach/Bowel: Bowel evaluation is limited in the absence of contrast, paucity of intra-abdominal fat, generalized edema of abdominal fat.  Stomach is unremarkable. There is no evidence of obstruction.  Appendix is not readily identified. Small volume of stool in the proximal colon. Distal colon is decompressed. Vascular/Lymphatic: Normal caliber abdominal aorta. Limited assessment for adenopathy on the current exam. Reproductive: The uterus appears present. There is suggestion of an ovoid high density structure in the right adnexa spanning 5.1 cm, pelvic free fluid appears complex. Streak artifact from dense IV contrast in the bladder obscures detailed assessment. Other: There is generalized edema of the subcutaneous and intra-abdominal fat. Complex free fluid in the right pericolic gutter, as well as in the pelvis. Lesser complex free fluid in the mesentery and possibly left pericolic gutter. Etiology is uncertain. No free air. Musculoskeletal: There are no acute or suspicious osseous abnormalities. IMPRESSION: 1. Complex free fluid in the right pericolic gutter, as well as in the pelvis, suspicious for hemoperitoneum. Etiology is uncertain, however there is suggestion of an ovoid high density structure in the right adnexa spanning 5.1 cm, that may represent a hemorrhagic cyst. 2. Splenomegaly. Multiple areas of low-density within the spleen suspicious for infarcts. There is a dominant cystic structure superiorly spanning 8.3 cm, indeterminate for additional infarct, cyst, or abscess. 3. Right pleural effusion. Patchy and ground-glass opacities in both lung bases, also confluent dependently. Findings may be secondary to pulmonary edema, pneumonia, or combination thereof. 4. Generalized edema of the subcutaneous and intra-abdominal fat. 5. Contrast within both renal collecting systems from prior neck CTA. There is suggestion of mild striated nephrograms, which can be seen with urinary tract infection. 6. Patient may benefit from contrast enhanced exam to further delineate. If she is hemodynamically stable, recommend trending of renal function, and  follow-up CT could be performed in the next day. These results were called by telephone at the time of interpretation on 07/02/2020 at 11:28 pm to provider CLAUDIA GIBBONS , who verbally acknowledged these results. Electronically Signed   By: Keith Rake M.D.   On: 07/11/2020 23:28   CT Code Stroke CTA Head W/WO contrast  Result Date: 07/12/2020 CLINICAL DATA:  Left facial droop.  Right MCA infarct. EXAM: CT ANGIOGRAPHY HEAD AND NECK TECHNIQUE: Multidetector CT imaging of the head and neck was performed using the standard protocol during bolus administration of intravenous contrast. Multiplanar CT image reconstructions and MIPs were obtained to evaluate the vascular anatomy. Carotid stenosis measurements (when applicable) are obtained utilizing NASCET criteria, using the distal internal carotid diameter as the denominator. CONTRAST:  44mL OMNIPAQUE IOHEXOL 350 MG/ML SOLN COMPARISON:  None. FINDINGS: CTA NECK FINDINGS SKELETON: There is no bony spinal canal stenosis. No lytic or blastic lesion. OTHER NECK: Normal pharynx, larynx and major salivary glands. No cervical lymphadenopathy. Unremarkable thyroid gland. UPPER CHEST: Small pleural effusions with areas of right apical consolidation. AORTIC ARCH: There is no calcific atherosclerosis of the aortic arch. There is no aneurysm, dissection or hemodynamically significant stenosis of the visualized portion of the aorta. Conventional 3 vessel aortic branching pattern. The visualized proximal subclavian arteries are widely patent. RIGHT CAROTID SYSTEM: Normal without aneurysm, dissection or stenosis. LEFT CAROTID SYSTEM: Normal without aneurysm, dissection or stenosis. VERTEBRAL ARTERIES: Left dominant configuration. Both origins are clearly patent. There is no dissection, occlusion or flow-limiting stenosis to the skull base (V1-V3 segments). CTA HEAD FINDINGS POSTERIOR CIRCULATION: --Vertebral arteries: Normal V4 segments. --Inferior cerebellar arteries:  Normal. --Basilar artery: Normal. --Superior cerebellar arteries: Normal. --Posterior cerebral arteries (PCA): Normal. ANTERIOR CIRCULATION: --Intracranial internal carotid arteries: Normal. --Anterior cerebral arteries (ACA): Normal. Both A1 segments are present. Patent anterior communicating artery (a-comm). --Middle cerebral arteries (MCA): Proximal  right M1 segment is occluded. The more distal M1 segment opacifies normally. There is limited collateralization within the anterior right MCA territory. M2 branches are otherwise predominantly patent. VENOUS SINUSES: As permitted by contrast timing, patent. ANATOMIC VARIANTS: Both P comms are present. Review of the MIP images confirms the above findings. IMPRESSION: 1. Occlusion of the proximal right M1 segment with limited collateralization within the anterior right MCA territory. 2. Large right MCA territory infarct with previously reported ASPECTS of 0. 3. Small pleural effusions with areas of right apical consolidation. These results were communicated to Dr. Kerney Elbe at 10:42 pm on 07/04/2020 by text page via the [facility] messaging system. Electronically Signed   By: Ulyses Jarred M.D.   On: 07/03/2020 22:44   CT HEAD WO CONTRAST  Result Date: 06/27/2020 CLINICAL DATA:  Stroke, follow-up EXAM: CT HEAD WITHOUT CONTRAST TECHNIQUE: Contiguous axial images were obtained from the base of the skull through the vertex without intravenous contrast. COMPARISON:  Earlier same day FINDINGS: Brain: Evolving right MCA territory infarction is again identified with hypoattenuation and loss of gray-white differentiation. Relative sparing of the right basal ganglia and some cortex could reflect areas of petechial hemorrhage. However, there is no discrete hematoma. No midline shift or hydrocephalus. Vascular: Persistent hyperdensity along the right M1 MCA and focal calcification probably along a right M2 MCA branch in the sylvian fissure. Skull: Calvarium is unremarkable.  Sinuses/Orbits: No acute finding. Other: None. IMPRESSION: Evolving large right MCA territory infarction with possible petechial hemorrhage. No discrete hematoma. No significant mass effect at this time. Electronically Signed   By: Macy Mis M.D.   On: 06/29/2020 10:18   CT HEAD WO CONTRAST  Result Date: 07/22/2020 CLINICAL DATA:  39 year old female code stroke presentation, right MCA E LV 0. EXAM: CT HEAD WITHOUT CONTRAST TECHNIQUE: Contiguous axial images were obtained from the base of the skull through the vertex without intravenous contrast. COMPARISON:  CT head, CTA and CTP yesterday. FINDINGS: Brain: Confluent vasogenic edema in much of the right MCA territory as before. Apparent sparing of the right caudate and lentiform might reflect petechial hemorrhage in those areas. Along probable 2nd the right posterior MCA area of petechial hemorrhage/PCA watershed on series 4, image 23, stable. No malignant hemorrhagic transformation. Stable extent of cytotoxic edema since 20/1 38 hours yesterday. Mass effect with trace leftward midline shift (coronal image 34) has not significantly changed. Stable mild mass effect on the ventricles with no ventriculomegaly. Basilar cisterns remain patent. Gray-white matter differentiation outside of the right MCA territory is stable and within normal limits. Vascular: Abnormal increased density and heterogeneity of the right M1, with small calcifications in right MCA sylvian fissure branches which is suspicious for calcified thromboembolic disease. Skull: Stable, intact. Sinuses/Orbits: Stable small fluid level in the right sphenoid. Other Visualized paranasal sinuses and mastoids are stable and well pneumatized. Other: No acute orbit or scalp soft tissue finding. IMPRESSION: 1. Extensive Right MCA territory infarct with cytotoxic edema and mild intracranial mass effect not significantly changed from 2138 hours yesterday. Possible petechial hemorrhage, but no malignant  hemorrhagic transformation. 2. Stable trace leftward midline shift. Basilar cisterns remain patent. 3. Other vascular territories remain within normal limits. These results were communicated to Dr. Cheral Marker at 4:28 am on 07/04/2020 by text page via the [facility] messaging system. Electronically Signed   By: Genevie Ann M.D.   On: 07/09/2020 04:28   CT Code Stroke CTA Neck W/WO contrast  Result Date: 07/15/2020 CLINICAL DATA:  Left facial droop.  Right MCA infarct. EXAM: CT ANGIOGRAPHY HEAD AND NECK TECHNIQUE: Multidetector CT imaging of the head and neck was performed using the standard protocol during bolus administration of intravenous contrast. Multiplanar CT image reconstructions and MIPs were obtained to evaluate the vascular anatomy. Carotid stenosis measurements (when applicable) are obtained utilizing NASCET criteria, using the distal internal carotid diameter as the denominator. CONTRAST:  33mL OMNIPAQUE IOHEXOL 350 MG/ML SOLN COMPARISON:  None. FINDINGS: CTA NECK FINDINGS SKELETON: There is no bony spinal canal stenosis. No lytic or blastic lesion. OTHER NECK: Normal pharynx, larynx and major salivary glands. No cervical lymphadenopathy. Unremarkable thyroid gland. UPPER CHEST: Small pleural effusions with areas of right apical consolidation. AORTIC ARCH: There is no calcific atherosclerosis of the aortic arch. There is no aneurysm, dissection or hemodynamically significant stenosis of the visualized portion of the aorta. Conventional 3 vessel aortic branching pattern. The visualized proximal subclavian arteries are widely patent. RIGHT CAROTID SYSTEM: Normal without aneurysm, dissection or stenosis. LEFT CAROTID SYSTEM: Normal without aneurysm, dissection or stenosis. VERTEBRAL ARTERIES: Left dominant configuration. Both origins are clearly patent. There is no dissection, occlusion or flow-limiting stenosis to the skull base (V1-V3 segments). CTA HEAD FINDINGS POSTERIOR CIRCULATION: --Vertebral arteries:  Normal V4 segments. --Inferior cerebellar arteries: Normal. --Basilar artery: Normal. --Superior cerebellar arteries: Normal. --Posterior cerebral arteries (PCA): Normal. ANTERIOR CIRCULATION: --Intracranial internal carotid arteries: Normal. --Anterior cerebral arteries (ACA): Normal. Both A1 segments are present. Patent anterior communicating artery (a-comm). --Middle cerebral arteries (MCA): Proximal right M1 segment is occluded. The more distal M1 segment opacifies normally. There is limited collateralization within the anterior right MCA territory. M2 branches are otherwise predominantly patent. VENOUS SINUSES: As permitted by contrast timing, patent. ANATOMIC VARIANTS: Both P comms are present. Review of the MIP images confirms the above findings. IMPRESSION: 1. Occlusion of the proximal right M1 segment with limited collateralization within the anterior right MCA territory. 2. Large right MCA territory infarct with previously reported ASPECTS of 0. 3. Small pleural effusions with areas of right apical consolidation. These results were communicated to Dr. Kerney Elbe at 10:42 pm on 07/05/2020 by text page via the [facility] messaging system. Electronically Signed   By: Ulyses Jarred M.D.   On: 07/02/2020 22:44   CT CEREBRAL PERFUSION W CONTRAST  Addendum Date: 07/17/2020   ADDENDUM REPORT: 07/03/2020 00:10 ADDENDUM: The above numbers refer strictly to the computed data. Given the large amount of hypoattenuation demonstrated on the pre contrast head CT, the degree of core infarct is likely underestimated by the RAPID software. This is most evident at the supraganglionic level and in the anterior ganglionic level zones. However, it is not possible to accurately quantify the degree to which the calculated core infarct is underestimated. The calculated area of Tmax>6.0s appears to more closely correspond to the hypodense region on the earlier noncontrast CT than does the area of CBF<30%. I suspect the greatest  area of penumbra is in the posterior right MCA territory. These findings were discussed with Dr. Cheral Marker at 12:01 AM on 07/21/2020. Electronically Signed   By: Ulyses Jarred M.D.   On: 07/07/2020 00:10   Result Date: 07/15/2020 CLINICAL DATA:  Right MCA stroke EXAM: CT PERFUSION BRAIN TECHNIQUE: Multiphase CT imaging of the brain was performed following IV bolus contrast injection. Subsequent parametric perfusion maps were calculated using RAPID software. CONTRAST:  76mL OMNIPAQUE IOHEXOL 350 MG/ML SOLN COMPARISON:  None. FINDINGS: CT Brain Perfusion Findings: CBF (<30%) Volume: 29mL Perfusion (Tmax>6.0s) volume: 137mL Mismatch Volume: 150mL ASPECTS on noncontrast CT  Head: 0 at 9:38 p.m. today. Infarct Core: 35 mL Infarction Location:Right MCA territory IMPRESSION: 35 mL core infarct in the right MCA territory with 110 mL surrounding ischemic penumbra. Electronically Signed: By: Ulyses Jarred M.D. On: 07/19/2020 23:32   DG CHEST PORT 1 VIEW  Result Date: 06/24/2020 CLINICAL DATA:  Acute respiratory failure.  Intubation. EXAM: PORTABLE CHEST 1 VIEW COMPARISON:  One-view chest x-ray 07/03/2020 at 7 o'clock a.m. FINDINGS: Heart size is at right low lung volumes. Patient has been intubated. Endotracheal tube terminates 2 cm above the carina. Right IJ line is stable. Diffuse interstitial and airspace opacities are again noted bilaterally. Aeration is slightly improved from the prior study. IMPRESSION: 1. Interval intubation. Endotracheal tube terminates 2 cm above the carina. 2. Right IJ line is stable. 3. Low lung volumes and diffuse interstitial and airspace opacities bilaterally likely representing edema. Electronically Signed   By: San Morelle M.D.   On: 07/02/2020 15:40   DG CHEST PORT 1 VIEW  Result Date: 06/27/2020 CLINICAL DATA:  39 year old female central line placement. Right MCA ELVO, large infarct. EXAM: PORTABLE CHEST 1 VIEW COMPARISON:  Portable chest 06/30/2020. FINDINGS: Portable AP  semi upright view at 0700 hours. Right IJ approach central line placed, tip at the lower SVC level likely just above the cavoatrial junction. No pneumothorax. Stable cardiac size and mediastinal contours. Bilateral ventilation not significantly changed from last night. Confluent perihilar opacity and indistinct pulmonary vasculature. Negative visible bowel gas, osseous structures. IMPRESSION: 1. Right IJ approach central line placed, tip at the lower SVC level. No adverse features. 2. Continued extensive perihilar opacity and indistinct vasculature favoring acute pulmonary edema. Electronically Signed   By: Genevie Ann M.D.   On: 07/09/2020 07:13   DG Chest Portable 1 View  Result Date: 07/12/2020 CLINICAL DATA:  39 year old female with leukocytosis. EXAM: PORTABLE CHEST 1 VIEW COMPARISON:  Chest radiograph dated 03/25/2006. FINDINGS: There is cardiomegaly with diffuse interstitial and interlobular septal prominence and Kerley B-lines most consistent with edema. Pneumonia is not excluded clinical correlation is recommended. There is a small left pleural effusion. No pneumothorax. No acute osseous pathology. IMPRESSION: Cardiomegaly with findings of CHF. Pneumonia is not excluded. Electronically Signed   By: Anner Crete M.D.   On: 07/14/2020 23:39   ECHOCARDIOGRAM COMPLETE  Result Date: 07/16/2020    ECHOCARDIOGRAM REPORT   Patient Name:   Terri Oneal Date of Exam: 07/12/2020 Medical Rec #:  [id number]       Height:       64.0 in Accession #:    [id number]      Weight:       119.7 lb Date of Birth:  1980/10/27        BSA:          1.573 m Patient Age:    68 years        BP:           113/54 mmHg Patient Gender: F               HR:           120 bpm. Exam Location:  Inpatient Procedure: 2D Echo, Cardiac Doppler and Color Doppler Indications:    Endocarditis I38  History:        Patient has no prior history of Echocardiogram examinations.                 Mitral Valve Prolapse.  Sonographer:    Bernadene Person RDCS Referring  Phys: [id number] RAVI AGARWALA IMPRESSIONS  1. Left ventricular ejection fraction, by estimation, is 60 to 65%. The left ventricle has normal function. The left ventricle has no regional wall motion abnormalities. Left ventricular diastolic function could not be evaluated.  2. Right ventricular systolic function is normal. The right ventricular size is normal.  3. The mitral valve is normal in structure. Mild mitral valve regurgitation. No evidence of mitral stenosis.  4. Tricuspid valve regurgitation is mild to moderate.     5. There is at least one mobile shaggy density on the ventricular side of the AV. The valve is not visualized well enough to discern which cusp it emanates from but possibly involves more than 1 cusp. There is also concern for perivalvular abcess with flow into a small echolucent space in the area of the right coronary cusp. There is at least moderate and possibly severe aortic regurgiation. This is consistent with endocarditis. Recommend TEE.  5. The inferior vena cava is normal in size with greater than 50% respiratory variability, suggesting right atrial pressure of 3 mmHg. FINDINGS  Left Ventricle: Left ventricular ejection fraction, by estimation, is 60 to 65%. The left ventricle has normal function. The left ventricle has no regional wall motion abnormalities. The left ventricular internal cavity size was normal in size. There is  no left ventricular hypertrophy. Left ventricular diastolic function could not be evaluated. Right Ventricle: The right ventricular size is normal. No increase in right ventricular wall thickness. Right ventricular systolic function is normal. Left Atrium: Left atrial size was normal in size. Right Atrium: Right atrial size was normal in size. Pericardium: There is no evidence of pericardial effusion. Mitral Valve: The mitral valve is normal in structure. There is mild thickening of the mitral valve leaflet(s). Mild mitral valve  regurgitation. No evidence of mitral valve stenosis. Tricuspid Valve: The tricuspid valve is normal in structure. Tricuspid valve regurgitation is mild to moderate. No evidence of tricuspid stenosis. Aortic Valve: There is at least one mobile shaggy density on the ventricular side of the AV. The valve is not visualized well enough to discern which cusp it emanates from but possibly involves more than 1 cusp. There is also concern for perivalvular abcess with flow into a small echolucent space in the area of the right coronary cusp. There is at least moderate and possibly severe aortic regurgiation. This is consistent with endocarditis. Recommend TEE. The aortic valve is abnormal. Aortic valve regurgitation is moderate to severe. No aortic stenosis is present. Pulmonic Valve: The pulmonic valve was normal in structure. Pulmonic valve regurgitation is not visualized. No evidence of pulmonic stenosis. Aorta: The aortic root is normal in size and structure. Venous: The inferior vena cava is normal in size with greater than 50% respiratory variability, suggesting right atrial pressure of 3 mmHg. IAS/Shunts: No atrial level shunt detected by color flow Doppler.  LEFT VENTRICLE PLAX 2D LVIDd:         4.60 cm LVIDs:         2.80 cm LV PW:         0.80 cm LV IVS:        0.80 cm LVOT diam:     2.00 cm LV SV:         91 LV SV Index:   58 LVOT Area:     3.14 cm  RIGHT VENTRICLE TAPSE (M-mode): 2.1 cm LEFT ATRIUM             Index  RIGHT ATRIUM           Index LA diam:        3.40 cm 2.16 cm/m  RA Area:     11.30 cm LA Vol (A2C):   34.1 ml 21.68 ml/m RA Volume:   24.50 ml  15.58 ml/m LA Vol (A4C):   90.5 ml 57.53 ml/m LA Biplane Vol: 58.5 ml 37.19 ml/m  AORTIC VALVE LVOT Vmax:   181.00 cm/s LVOT Vmean:  114.000 cm/s LVOT VTI:    0.291 m  AORTA Ao Root diam: 3.20 cm Ao Asc diam:  3.60 cm  SHUNTS Systemic VTI:  0.29 m Systemic Diam: 2.00 cm Fransico Him MD Electronically signed by Fransico Him MD Signature Date/Time:  07/22/2020/4:45:48 PM    Final    CT HEAD CODE STROKE WO CONTRAST  Result Date: 07/01/2020 CLINICAL DATA:  Code stroke.  Neuro deficit, acute, stroke suspected EXAM: CT HEAD WITHOUT CONTRAST TECHNIQUE: Contiguous axial images were obtained from the base of the skull through the vertex without intravenous contrast. COMPARISON:  05/16/2010. FINDINGS: Brain: No intracranial hemorrhage. Acute right MCA territory insult with blurring of the gray-white junctions and cerebral edema. Partial effacement of the right lateral ventricle. No mass lesion. No midline shift, ventriculomegaly or extra-axial fluid collection. Vascular: No hyperdense vessel or unexpected calcification. Skull: Negative for fracture or focal lesion. Sinuses/Orbits: No acute orbital finding. Layering sphenoid sinus secretions. Mild ethmoid sinus mucosal thickening. Pneumatized mastoid air cells. Other: None. ASPECTS [facility] Stroke Program Early CT Score) - Ganglionic level infarction (caudate, lentiform nuclei, internal capsule, insula, M1-M3 cortex): 0 - Supraganglionic infarction (M4-M6 cortex): 0 Total score (0-10 with 10 being normal): 0 IMPRESSION: 1. Acute/subacute right MCA territory infarct. No intracranial hemorrhage. 2. ASPECTS is 0 Code stroke imaging results were communicated on 07/06/2020 at 9:45 pm to provider Dr. Cheral Marker via secure text paging. Electronically Signed   By: Primitivo Gauze M.D.   On: 07/12/2020 21:49      Assessment/Plan:  INTERVAL HISTORY:   Patient is status post craniectomy by Dr. Kathyrn Sheriff TTE with shaggy vegetation on the ventricular side of the atrial valve with area concerning for perivalvular abscess   Active Problems:   Acute stroke due to occlusion of right middle cerebral artery ([place])    RAGINA FENTER is a 39 y.o. 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     LOS: 2 days   Alcide Evener 07/09/2020, 10:31 AM

## 2020-07-09 NOTE — Progress Notes (Addendum)
NAME:  Terri GOGUEN, MRN:  [id number], DOB:  [birth year], LOS: 2 ADMISSION DATE:  07/02/2020, CONSULTATION DATE:  07/24/20 REFERRING MD: Dr.Lindzen CHIEF COMPLAINT: left side weakness  Brief History   39 yo F w/ PMH of IVDU presenting with L hemiparesis and R gaze. Head CT showing large MCA infarct with midline shift  History of present illness   Terri Oneal is a 39 yo F w/ PMH of polysubstance abuse including IV heroin, prior endocarditis w/ mitral valve prolapse, h ypothyroidism who was  In her usual state of health until 9am yesterday. She was found down at home by her boyrfriend around 3pm in the afternoon with Left sided weakness. EMS was called ans she was brough to [facility] for evaluation. She was found to have rM1 occlusion with large MCA infarct on stat CT during code strokes. She was also found to have R paracloic gutter fluid and splenic infarct on CT abd/pelvis  PCCM consulted for ICU admission for further work up and management including hypertonic saline administration  Past Medical History  Polysubstance use Prior endocarditis w/ mitral valve prolapse Hypothyroidism Anxiety/depression  Significant Hospital Events   12/14 admit 12/15 resp distress, intubated  Consults:  Neurology, Neurosurgery, Cardiology, Infectious disease  Procedures:  12/15 central line insertion 12/15 intubation 12/15 Right decompressive craniotomy  Significant Diagnostic Tests:   CT angio head and neck 12/14 > occlusion of the proximal right M1 and collaterals resulting in large right MCA infarct  CT abdomen pelvis 12/14 > CT abdomen and pelvis which revealed complex free fluid in the right paracolic gutter suspicious for hemoperitoneum as well as splenomegaly with likely several areas of splenic infarcts  Cerebral perfusion study 12/14 > 35 mL core infarct in the right MCA territory with 110 mL surrounding ischemic penumbra.  Echocardiogram 12/15 > aortic valve vegetation   Micro Data:   12/14 COVID neg 12/14 Blood culture >> enterococcus faecalies  Antimicrobials:  12/14 vanc >> d/c 12/14 cefepime >> d/c 12/15 ampicillin >> 12/15 ceftriaxone >>  Interim history/subjective:  O/N event: Noted to have purulent urine output, Hypotension requiring initiation of phenylephrine for pressor support  Terri Oneal was examined and evaluated at bedside this am. She was observed well-sedated and minimally responsive. Does move with painful stimuli.  Objective   Blood pressure (!) 93/50, pulse (!) 106, temperature (!) 101.8 F (38.8 C), temperature source Axillary, resp. rate (!) 25, height 5\' 4"  (1.626 m), weight 54.3 kg, SpO2 93 %.    Vent Mode: PRVC FiO2 (%):  [40 %-100 %] 40 % Set Rate:  [16 bmp] 16 bmp Vt Set:  [430 mL] 430 mL PEEP:  [5 cmH20-8 cmH20] 8 cmH20 Plateau Pressure:  [20 cmH20-27 cmH20] 24 cmH20   Intake/Output Summary (Last 24 hours) at 07/09/2020 0701 Last data filed at 07/09/2020 0600 Gross per 24 hour  Intake 3520.11 ml  Output 325 ml  Net 3195.11 ml   Filed Weights   2020-07-24 1000  Weight: 54.3 kg   Examination: Gen: Well-developed, ill-appearing HEENT: Craniotomy surgical site intact, intubated, icteric sclerae Neck: supple, ROM intact CV: tachycardic, regular rhythm Pulm: Bibasilar rales, no wheezes Abd: Soft, BS+, Distended Extm: ROM intact, Peripheral pulses intact, No peripheral edema Skin: Dry, Warm, jaundiced  Neuro: well-sedated  Resolved Hospital Problem list     Assessment & Plan:   Sepsis Enterococcus faecalis bacteremia Noted to have leukocytosis, hypotension, tachycardia, tachypnea. Has hx of sepsis with endocarditis due to IV drug use. Started on Neo yesterday. Blood culture +  for enterococcus faecalis. ID on board. Getting ampicillin, ceftriaxone. Wbc down-trending 24.5->18.5. Continues to be tachycardic, febrile.  - Appreciate ID recs - C/w ampicillin, ceftriaxone - Wean off pressors while keeping mAP goal >65 - F/u  blood culture for sensitivities  Endocarditis Aortic Valve vegetation Noted endocarditis with aortic valve vegetation on TTE. Need follow up TEE to investigate further. On antibiotics. Cardiology consulted yesterday - F/u TEE (planned for this pm per cardiology)  Large acute/subacute right MCA Present w/ L hemiplegia. Outside tpa window. Not thrombectomy candidate. Neuro on board. S/p craniotomy w/ neurosurgery. Off hypertonic saline overnight - Appreciate neuro / neurosurgery recs - Aspiration/seizure precautions - Neurochecks  Hyperbilirubinemia Acute on chronic anemia + Hep C antiboyd Noted to have acute rise in bilirubin from 3.4->7.6. No biliary disease based on CT abd/pelvis w/o contrast. Required 2 transfusions yesterday due to hgb drop from 8.0->6.1. Currently stable. 8.5->8.7->8.4 Possibly due to hemolysis from aortic valve vegetation or sequelae of hepatitis infection or splenic dysfunction - Need contrasted study to further investigate - F/u ldh, haptoglobin, reticulocyte count - F/u hep C quantitative viral RNA - Monitor cbc - Transfuse if <7  Complex free peritoneal fluid  Possibly due to ovarian cyst rupture. Would benefit from contrasted imaging but will monitor for now on antibiotics  - C/w broad spectrum IV - F/u urine culture - May need gen surg consult for splenectomy  Polysubstance abuse Patient reported use of IV heroin 1 day prior to admission - Monitor for withdrawal sxs - Need to quit  Best practice (evaluated daily)   Diet: NPO Pain/Anxiety/Delirium protocol (if indicated): fentanyl VAP protocol (if indicated): Y DVT prophylaxis: scd GI prophylaxis: PPI Glucose control: N/A Mobility: BR last date of multidisciplinary goals of care discussion 07/24/2020 Family and staff present Y Summary of discussion Continue full scope Follow up goals of care discussion due 07/09/20 Code Status: Full Disposition: ICU  Labs   CBC: Recent Labs  Lab  Aug 03, 2020 2136 08-03-2020 2210 08/03/2020 2342 07/24/2020 0757 06/25/2020 1021 07/04/2020 1648 07/14/2020 2117 07/09/20 0309  WBC 19.9*  --  16.6* 20.7*  --   --  24.5* 18.5*  NEUTROABS 17.7*  --   --   --   --   --   --   --   HGB 8.0*   < > 8.6* 6.5* 6.1* 8.5* 8.7* 8.4*  HCT 26.9*   < > 28.6* 21.1* 18.0* 25.0* 28.6* 25.8*  MCV 95.4  --  94.1 92.5  --   --  94.4 91.5  PLT 103*  --  83* 113*  --   --  115* 108*   < > = values in this interval not displayed.    Basic Metabolic Panel: Recent Labs  Lab 08-03-2020 2136 2020/08/03 2210 03-Aug-2020 2222 2020/08/03 2342 07/20/2020 0757 07/19/2020 1021 07/05/2020 1137 07/10/2020 1648 07/02/2020 2117 07/09/20 0309  NA 132* 132*   < >  --  138 140 140 144 144 146*  K 4.1 4.1  --   --  3.8 3.7  --  3.7  --  3.7  CL 99 100  --   --  106  --   --   --   --  116*  CO2 20*  --   --   --  21*  --   --   --   --  19*  GLUCOSE 111* 108*  --   --  93  --   --   --   --  94  BUN 20 22*  --   --  19  --   --   --   --  20  CREATININE 0.97 0.80  --  0.75 0.77  --   --   --   --  0.85  CALCIUM 7.9*  --   --   --  7.5*  --   --   --   --  7.6*  MG  --   --   --   --   --   --   --   --   --  2.1  PHOS  --   --   --   --   --   --   --   --   --  3.9   < > = values in this interval not displayed.   GFR: Estimated Creatinine Clearance: 74.7 mL/min (by C-G formula based on SCr of 0.85 mg/dL). Recent Labs  Lab Apr 12, 2020 2239 Apr 12, 2020 2342 07/12/2020 0757 07/12/2020 2117 07/09/20 0309  WBC  --  16.6* 20.7* 24.5* 18.5*  LATICACIDVEN 3.2*  --  1.6  --   --     Liver Function Tests: Recent Labs  Lab Apr 12, 2020 2136 07/09/20 0309  AST 42* 34  ALT 18 15  ALKPHOS 157* 102  BILITOT 3.5* 7.6*  PROT 6.2* 4.7*  ALBUMIN 1.9* 1.7*   No results for input(s): LIPASE, AMYLASE in the last 168 hours. No results for input(s): AMMONIA in the last 168 hours.  ABG    Component Value Date/Time   PHART 7.436 07/03/2020 1648   PCO2ART 32.6 07/19/2020 1648   PO2ART 277 (H)  07/07/2020 1648   HCO3 21.9 07/06/2020 1648   TCO2 23 07/02/2020 1648   ACIDBASEDEF 2.0 06/24/2020 1648   O2SAT 100.0 06/26/2020 1648     Coagulation Profile: Recent Labs  Lab Apr 12, 2020 2136 07/14/2020 1137  INR 1.4* 1.5*    Cardiac Enzymes: No results for input(s): CKTOTAL, CKMB, CKMBINDEX, TROPONINI in the last 168 hours.  HbA1C: Hgb A1c MFr Bld  Date/Time Value Ref Range Status  07/04/2020 07:57 AM 4.5 (L) 4.8 - 5.6 % Final    Comment:    (NOTE) Pre diabetes:          5.7%-6.4%  Diabetes:              >6.4%  Glycemic control for   <7.0% adults with diabetes     CBG: Recent Labs  Lab 06/28/2020 1249 07/02/2020 1553 07/05/2020 1945 07/07/2020 2337 07/09/20 0336  GLUCAP 92 115* 104* 92 90    Review of Systems:   Unable to assess  Past Medical History  She,  has a past medical history of Anxiety and depression, Hypothyroid, Migraine, MVP (mitral valve prolapse), Opioid abuse, in remission (HCC), and Polysubstance abuse (HCC).   Surgical History    Past Surgical History:  Procedure Laterality Date  . KNEE ARTHROPLASTY    . KNEE SURGERY    . MOUTH SURGERY       Social History   reports that she has been smoking cigarettes. She has been smoking about 1.00 pack per day. She has never used smokeless tobacco. She reports current alcohol use. She reports current drug use. Drugs: Heroin and IV.   Family History   Her family history includes Diabetes in an other family member; Hypertension in an other family member; Thyroid disease in an other family member.   Allergies No Known Allergies   Home Medications  Prior  to Admission medications   Medication Sig Start Date End Date Taking? Authorizing Provider  ibuprofen (ADVIL) 200 MG tablet Take 600-800 mg by mouth every 6 (six) hours as needed for headache or mild pain.   Yes [provider]    Theotis Barrio, MD 07/09/2020, 7:01 AM PGY-3, [facility] Internal Medicine Pager: [phone]  Critical care  time:

## 2020-07-09 NOTE — Progress Notes (Signed)
  NEUROSURGERY PROGRESS NOTE   Events of last pm reviewed.   EXAM:  BP (!) 90/47   Pulse (!) 102   Temp 99.7 F (37.6 C) (Axillary)   Resp (!) 22   Ht 5\' 4"  (1.626 m)   Wt 54.3 kg   SpO2 96%   BMI 20.55 kg/m   Remains intubated, sedated Noted by RN to be moving RUE/RLE, no movement LUE/LLE Flap soft, wound with minimal dried blood  IMPRESSION:  39 y.o. female s/p RMCA stroke and clinical decline, POD#1 s/p right decompressive hemicraniectomy. Enterococcal bacteremia with endocarditis  PLAN: - Cont mgmt per PCCM, neurology, cardiology, ID - Can remove dressing tomorrow   44, MD [facility]

## 2020-07-09 NOTE — Progress Notes (Signed)
  Echocardiogram Echocardiogram Transesophageal has been performed.  Tye Savoy 07/09/2020, 2:31 PM

## 2020-07-09 NOTE — Progress Notes (Signed)
eLink Physician-Brief Progress Note Patient Name: Terri Oneal DOB: 1981-04-28 MRN: [id number]   Date of Service  07/09/2020  HPI/Events of Note  Patient with hypoglycemia.  eICU Interventions  Iv fluid changed from NS at 100 ml / hour to D 5 % LR  @  100 ml / hour.        Thomasene Lot [name] 07/09/2020, 11:45 PM

## 2020-07-09 NOTE — TOC CAGE-AID Note (Signed)
Transition of Care [facility]) - CAGE-AID Screening   Patient Details  Name: Terri Oneal MRN: [id number] Date of Birth: 1980/11/16  Transition of Care [facility]) CM/SW Contact:    Lorri Frederick, LCSW Phone Number: 07/09/2020, 1:17 PM   Clinical Narrative:    CAGE-AID Screening: Substance Abuse Screening unable to be completed due to: : Patient unable to participate (Pt remains intubated.)

## 2020-07-09 NOTE — Progress Notes (Signed)
Pt abdomen noted to be more distended. No bowel sounds auscultated. ELink made aware. No new orders at this time. RN will continue to monitor.

## 2020-07-09 NOTE — Progress Notes (Signed)
eLink Physician-Brief Progress Note Patient Name: Terri Oneal DOB: Apr 01, 1981 MRN: [id number]   Date of Service  07/09/2020  HPI/Events of Note  RN reports somewhat increased abdominal distension. Patient has had hemoperitoneum identified earlier in hospital course. H/H stable at 2100 hrs on 12/15, but had previously dropped requiring transfusion. Hemodynamically, patient is also relatively stable with SBP goal being met overall albeit with some intermittent low/borderline values.   eICU Interventions  We do need to be cognizant of the risk for recurrent hemoperitoneum in this patient, however she has relatively stable vitals and stable H/H at 2100 hrs (being checked Q6H). I have recommended close clinical observation and drawing AM labs at 0300 hrs (or earlier if she develops hypotension) to ensure there is no drop in H/H.     Intervention Category Intermediate Interventions: Abdominal pain - evaluation and management  Marily Lente Lisandro Meggett 07/09/2020, 1:04 AM

## 2020-07-10 ENCOUNTER — Inpatient Hospital Stay (HOSPITAL_COMMUNITY): Payer: Self-pay

## 2020-07-10 DIAGNOSIS — I76 Septic arterial embolism: Secondary | ICD-10-CM

## 2020-07-10 DIAGNOSIS — I70221 Atherosclerosis of native arteries of extremities with rest pain, right leg: Secondary | ICD-10-CM

## 2020-07-10 DIAGNOSIS — R198 Other specified symptoms and signs involving the digestive system and abdomen: Secondary | ICD-10-CM

## 2020-07-10 DIAGNOSIS — D65 Disseminated intravascular coagulation [defibrination syndrome]: Secondary | ICD-10-CM

## 2020-07-10 LAB — GLUCOSE, CAPILLARY
Glucose-Capillary: 91 mg/dL (ref 70–99)
Glucose-Capillary: 105 mg/dL — ABNORMAL HIGH (ref 70–99)
Glucose-Capillary: 44 mg/dL — CL (ref 70–99)
Glucose-Capillary: 122 mg/dL — ABNORMAL HIGH (ref 70–99)

## 2020-07-10 LAB — COMPREHENSIVE METABOLIC PANEL
ALT: 16 U/L (ref 0–44)
AST: 92 U/L — ABNORMAL HIGH (ref 15–41)
Albumin: 1.8 g/dL — ABNORMAL LOW (ref 3.5–5.0)
Alkaline Phosphatase: 90 U/L (ref 38–126)
Anion gap: 19 — ABNORMAL HIGH (ref 5–15)
BUN: 31 mg/dL — ABNORMAL HIGH (ref 6–20)
CO2: 10 mmol/L — ABNORMAL LOW (ref 22–32)
Calcium: 7.5 mg/dL — ABNORMAL LOW (ref 8.9–10.3)
Chloride: 120 mmol/L — ABNORMAL HIGH (ref 98–111)
Creatinine, Ser: 1.4 mg/dL — ABNORMAL HIGH (ref 0.44–1.00)
GFR, Estimated: 48 mL/min — ABNORMAL LOW (ref >60)
Glucose, Bld: 91 mg/dL (ref 70–99)
Potassium: 4.3 mmol/L (ref 3.5–5.1)
Sodium: 149 mmol/L — ABNORMAL HIGH (ref 135–145)
Total Bilirubin: 9.5 mg/dL — ABNORMAL HIGH (ref 0.3–1.2)
Total Protein: 5 g/dL — ABNORMAL LOW (ref 6.5–8.1)

## 2020-07-10 LAB — CULTURE, BLOOD (ROUTINE X 2): Special Requests: ADEQUATE

## 2020-07-10 LAB — CBC WITH DIFFERENTIAL/PLATELET
Abs Immature Granulocytes: 1.37 10*3/uL — ABNORMAL HIGH (ref 0.00–0.07)
Basophils Absolute: 0 10*3/uL (ref 0.0–0.1)
Basophils Relative: 0 %
Eosinophils Absolute: 0 10*3/uL (ref 0.0–0.5)
Eosinophils Relative: 0 %
HCT: 29.3 % — ABNORMAL LOW (ref 36.0–46.0)
Hemoglobin: 8.6 g/dL — ABNORMAL LOW (ref 12.0–15.0)
Immature Granulocytes: 7 %
Lymphocytes Relative: 18 %
Lymphs Abs: 3.8 10*3/uL (ref 0.7–4.0)
MCH: 29.2 pg (ref 26.0–34.0)
MCHC: 29.4 g/dL — ABNORMAL LOW (ref 30.0–36.0)
MCV: 99.3 fL (ref 80.0–100.0)
Monocytes Absolute: 0.6 10*3/uL (ref 0.1–1.0)
Monocytes Relative: 3 %
Neutro Abs: 15.2 10*3/uL — ABNORMAL HIGH (ref 1.7–7.7)
Neutrophils Relative %: 72 %
Platelets: 31 10*3/uL — ABNORMAL LOW (ref 150–400)
RBC: 2.95 MIL/uL — ABNORMAL LOW (ref 3.87–5.11)
RDW: 22.3 % — ABNORMAL HIGH (ref 11.5–15.5)
WBC: 21 10*3/uL — ABNORMAL HIGH (ref 4.0–10.5)
nRBC: 2 % — ABNORMAL HIGH (ref 0.0–0.2)

## 2020-07-10 LAB — POCT I-STAT 7, (LYTES, BLD GAS, ICA,H+H)
Acid-base deficit: 12 mmol/L — ABNORMAL HIGH (ref 0.0–2.0)
Bicarbonate: 11.5 mmol/L — ABNORMAL LOW (ref 20.0–28.0)
Calcium, Ion: 1.13 mmol/L — ABNORMAL LOW (ref 1.15–1.40)
HCT: 26 % — ABNORMAL LOW (ref 36.0–46.0)
Hemoglobin: 8.8 g/dL — ABNORMAL LOW (ref 12.0–15.0)
O2 Saturation: 98 %
Patient temperature: 100.9
Potassium: 4.1 mmol/L (ref 3.5–5.1)
Sodium: 150 mmol/L — ABNORMAL HIGH (ref 135–145)
TCO2: 12 mmol/L — ABNORMAL LOW (ref 22–32)
pCO2 arterial: 21.2 mmHg — ABNORMAL LOW (ref 32.0–48.0)
pH, Arterial: 7.349 — ABNORMAL LOW (ref 7.350–7.450)
pO2, Arterial: 121 mmHg — ABNORMAL HIGH (ref 83.0–108.0)

## 2020-07-10 LAB — DIC (DISSEMINATED INTRAVASCULAR COAGULATION)PANEL
D-Dimer, Quant: >20 ug/mL-FEU — ABNORMAL HIGH (ref 0.00–0.50)
Fibrinogen: 189 mg/dL — ABNORMAL LOW (ref 210–475)
INR: 2.3 — ABNORMAL HIGH (ref 0.8–1.2)
Platelets: 30 10*3/uL — ABNORMAL LOW (ref 150–400)
Prothrombin Time: 24.8 seconds — ABNORMAL HIGH (ref 11.4–15.2)
Smear Review: NONE SEEN
aPTT: 54 seconds — ABNORMAL HIGH (ref 24–36)

## 2020-07-10 LAB — URINE CULTURE: Culture: >=100000 — AB

## 2020-07-10 LAB — TRIGLYCERIDES: Triglycerides: 192 mg/dL — ABNORMAL HIGH (ref <150)

## 2020-07-10 LAB — PHOSPHORUS: Phosphorus: 5.8 mg/dL — ABNORMAL HIGH (ref 2.5–4.6)

## 2020-07-10 LAB — LDL CHOLESTEROL, DIRECT: Direct LDL: 26.4 mg/dL (ref 0–99)

## 2020-07-10 LAB — CK: Total CK: 36 U/L — ABNORMAL LOW (ref 38–234)

## 2020-07-10 LAB — CULTURE, BLOOD (SINGLE): Special Requests: ADEQUATE

## 2020-07-10 LAB — MAGNESIUM: Magnesium: 2.2 mg/dL (ref 1.7–2.4)

## 2020-07-10 LAB — LACTIC ACID, PLASMA: Lactic Acid, Venous: 5.6 mmol/L (ref 0.5–1.9)

## 2020-07-10 LAB — HAPTOGLOBIN: Haptoglobin: <10 mg/dL — ABNORMAL LOW (ref 42–296)

## 2020-07-10 MED ORDER — HALOPERIDOL 0.5 MG PO TABS
0.5000 mg | ORAL_TABLET | ORAL | Status: DC | PRN
  Filled 2020-07-10: qty 1

## 2020-07-10 MED ORDER — ACETAMINOPHEN 325 MG PO TABS: 650.0000 mg | ORAL_TABLET | Freq: Four times a day (QID) | ORAL | Status: DC | PRN

## 2020-07-10 MED ORDER — MORPHINE 100MG IN NS 100ML (1MG/ML) PREMIX INFUSION: 5.0000 mg/h | INTRAVENOUS | Status: DC

## 2020-07-10 MED ORDER — SODIUM BICARBONATE 8.4 % IV SOLN
INTRAVENOUS | Status: DC
  Filled 2020-07-10: qty 100

## 2020-07-10 MED ORDER — HALOPERIDOL LACTATE 5 MG/ML IJ SOLN: 0.5000 mg | INTRAMUSCULAR | Status: DC | PRN

## 2020-07-10 MED ORDER — HALOPERIDOL LACTATE 2 MG/ML PO CONC
0.5000 mg | ORAL | Status: DC | PRN
  Filled 2020-07-10: qty 0.3

## 2020-07-10 MED ORDER — LORAZEPAM 1 MG PO TABS: 1.0000 mg | ORAL_TABLET | ORAL | Status: DC | PRN

## 2020-07-10 MED ORDER — POLYVINYL ALCOHOL 1.4 % OP SOLN
1.0000 [drp] | Freq: Four times a day (QID) | OPHTHALMIC | Status: DC | PRN
  Filled 2020-07-10: qty 15

## 2020-07-10 MED ORDER — MAGIC MOUTHWASH W/LIDOCAINE
15.0000 mL | Freq: Four times a day (QID) | ORAL | Status: DC | PRN
Start: 2020-07-10 — End: 2020-07-11
  Filled 2020-07-10: qty 15

## 2020-07-10 MED ORDER — GLYCOPYRROLATE 1 MG PO TABS
1.0000 mg | ORAL_TABLET | ORAL | Status: DC | PRN
  Filled 2020-07-10: qty 1

## 2020-07-10 MED ORDER — GLYCOPYRROLATE 0.2 MG/ML IJ SOLN: 0.2000 mg | INTRAMUSCULAR | Status: DC | PRN

## 2020-07-10 MED ORDER — ONDANSETRON HCL 4 MG/2ML IJ SOLN: 4.0000 mg | Freq: Four times a day (QID) | INTRAMUSCULAR | Status: DC | PRN

## 2020-07-10 MED ORDER — MORPHINE BOLUS VIA INFUSION
2.0000 mg | INTRAVENOUS | Status: DC | PRN
  Filled 2020-07-10: qty 2

## 2020-07-10 MED ORDER — LORAZEPAM 2 MG/ML IJ SOLN: 1.0000 mg | INTRAMUSCULAR | Status: DC | PRN

## 2020-07-10 MED ORDER — ACETAMINOPHEN 650 MG RE SUPP
650.0000 mg | Freq: Four times a day (QID) | RECTAL | Status: DC | PRN
  Administered 2020-07-10: 20:00:00 650 mg via RECTAL
  Filled 2020-07-10: qty 1

## 2020-07-10 MED ORDER — ONDANSETRON 4 MG PO TBDP: 4.0000 mg | ORAL_TABLET | Freq: Four times a day (QID) | ORAL | Status: DC | PRN

## 2020-07-10 MED ORDER — LORAZEPAM 2 MG/ML PO CONC: 1.0000 mg | ORAL | Status: DC | PRN

## 2020-07-10 MED ORDER — BIOTENE DRY MOUTH MT LIQD: 15.0000 mL | OROMUCOSAL | Status: DC | PRN

## 2020-07-10 MED ORDER — VITAL HIGH PROTEIN PO LIQD: 1000.0000 mL | ORAL | Status: DC

## 2020-07-10 MED ORDER — SODIUM CHLORIDE 0.9 % IV SOLN
2.0000 g | Freq: Four times a day (QID) | INTRAVENOUS | Status: DC
  Administered 2020-07-10: 12:00:00 2 g via INTRAVENOUS
  Filled 2020-07-10 (×3): qty 2000
  Filled 2020-07-10: qty 2

## 2020-07-10 NOTE — Progress Notes (Signed)
eLink Physician-Brief Progress Note Patient Name: Terri Oneal DOB: 05-29-1981 MRN: [id number]   Date of Service  07/10/2020  HPI/Events of Note  Patient with bilateral cool mottled lower extremities, left LE PT is doppler-able but right is not. Patient has septic emboli. Capillary refill < 3 seconds bilaterally per bedside RN.  eICU Interventions  Stat bilateral lower extremity arterial doppler to evaluate for ischemic limb, if study is positive will stat consult vascular surgery.        Terri Oneal 07/10/2020, 4:56 AM

## 2020-07-10 NOTE — Progress Notes (Signed)
STROKE TEAM PROGRESS NOTE   INTERVAL HISTORY Mom and dad are at the bedside.  Patient still intubated, on ventilation. Noted that pt had dramatic worsening overnight, with abdominal extension, septic shock needing pressor, high grade fever, renal failure, coagulopathy, severe thrombocytopenia, DIC although without schistocytes on blood smear.  Patient active dying at this time. I had long discussion with mom and dad at bedside, updated pt current condition, treatment measures and poor prognosis, and answered all the questions. They expressed understanding and appreciation. They are absorbing the bed news and likely proceed with comfort care tomorrow.    OBJECTIVE Vitals:   07/10/20 0100 07/10/20 0131 07/10/20 0200 07/10/20 0300  BP: (!) 91/56  (!) 83/46 (!) 88/54  Pulse: (!) 103 (!) 11 100 95  Resp: (!) 37 (!) 37 (!) 39 (!) 39  Temp:      TempSrc:      SpO2: 96% 94% 96% 99%  Weight:      Height:       CBC:  Recent Labs  Lab 07/20/2020 2136 07/11/2020 2210 2020/01/26 2117 07/09/20 0309 07/09/20 0757  WBC 19.9*   < > 24.5* 18.5*  --   NEUTROABS 17.7*  --   --   --   --   HGB 8.0*   < > 8.7* 8.4* 8.2*  HCT 26.9*   < > 28.6* 25.8* 24.0*  MCV 95.4   < > 94.4 91.5  --   PLT 103*   < > 115* 108*  --    < > = values in this interval not displayed.   Basic Metabolic Panel:  Recent Labs  Lab 2020/01/26 0757 2020/01/26 1021 07/09/20 0309 07/09/20 0757 07/09/20 1159  NA 138   < > 146* 147* 145  K 3.8   < > 3.7 3.5  --   CL 106  --  116*  --   --   CO2 21*  --  19*  --   --   GLUCOSE 93  --  94  --   --   BUN 19  --  20  --   --   CREATININE 0.77  --  0.85  --   --   CALCIUM 7.5*  --  7.6*  --   --   MG  --   --  2.1  --   --   PHOS  --   --  3.9  --   --    < > = values in this interval not displayed.   Lipid Panel:     Component Value Date/Time   CHOL 102 Dec 01, 2019 0757   TRIG 202 (H) 07/09/2020 0309   HDL <10 (L) Dec 01, 2019 0757   CHOLHDL NOT CALCULATED Dec 01, 2019 0757    VLDL 38 Dec 01, 2019 0757   LDLCALC NOT CALCULATED Dec 01, 2019 0757   HgbA1c:  Lab Results  Component Value Date   HGBA1C 4.5 (L) Dec 01, 2019   Urine Drug Screen:     Component Value Date/Time   LABOPIA NONE DETECTED Dec 01, 2019 0049   COCAINSCRNUR NONE DETECTED Dec 01, 2019 0049   LABBENZ NONE DETECTED Dec 01, 2019 0049   AMPHETMU NONE DETECTED Dec 01, 2019 0049   THCU NONE DETECTED Dec 01, 2019 0049   LABBARB NONE DETECTED Dec 01, 2019 0049    Alcohol Level     Component Value Date/Time   ETH <10 07/10/2020 2239    IMAGING  CT HEAD CODE STROKE WO CONTRAST 06/24/2020 IMPRESSION:  1. Acute/subacute right MCA territory infarct. No intracranial hemorrhage.  2. ASPECTS is 0  CT Code Stroke CTA Head W/WO contrast CT Code Stroke CTA Neck W/WO contrast 08-02-20 IMPRESSION:  1. Occlusion of the proximal right M1 segment with limited collateralization within the anterior right MCA territory.  2. Large right MCA territory infarct with previously reported ASPECTS of 0.  3. Small pleural effusions with areas of right apical consolidation.   CT ABDOMEN PELVIS WO CONTRAST 2020/08/02 IMPRESSION:  1. Complex free fluid in the right pericolic gutter, as well as in the pelvis, suspicious for hemoperitoneum. Etiology is uncertain, however there is suggestion of an ovoid high density structure in the right adnexa spanning 5.1 cm, that may represent a hemorrhagic cyst.  2. Splenomegaly. Multiple areas of low-density within the spleen suspicious for infarcts. There is a dominant cystic structure superiorly spanning 8.3 cm, indeterminate for additional infarct, cyst, or abscess.  3. Right pleural effusion. Patchy and ground-glass opacities in both lung bases, also confluent dependently. Findings may be secondary to pulmonary edema, pneumonia, or combination thereof.  4. Generalized edema of the subcutaneous and intra-abdominal fat.  5. Contrast within both renal collecting systems from prior neck CTA.  There is suggestion of mild striated nephrograms, which can be seen with urinary tract infection.  6. Patient may benefit from contrast enhanced exam to further delineate. If she is hemodynamically stable, recommend trending of renal function, and follow-up CT could be performed in the next day.  CT HEAD WO CONTRAST 07/15/2020 IMPRESSION:  1. Extensive Right MCA territory infarct with cytotoxic edema and mild intracranial mass effect not significantly changed from 2138 hours yesterday. Possible petechial hemorrhage, but no malignant hemorrhagic transformation.  2. Stable trace leftward midline shift. Basilar cisterns remain patent.  3. Other vascular territories remain within normal limits.   CT CEREBRAL PERFUSION W CONTRAST 06/26/2020   ADDENDUM REPORT: 07/09/2020 00:10 ADDENDUM: The above numbers refer strictly to the computed data. Given the large amount of hypoattenuation demonstrated on the pre contrast head CT, the degree of core infarct is likely underestimated by the RAPID software. This is most evident at the supraganglionic level and in the anterior ganglionic level zones. However, it is not possible to accurately quantify the degree to which the calculated core infarct is underestimated. The calculated area of Tmax>6.0s appears to more closely correspond to the hypodense region on the earlier noncontrast CT than does the area of CBF<30%. I suspect the greatest area of penumbra is in the posterior right MCA territory. IMPRESSION:  35 mL core infarct in the right MCA territory with 110 mL surrounding ischemic penumbra.   CT ABDOMEN PELVIS WO CONTRAST CT ABDOMEN PELVIS WO CONTRAST CT CHEST ABDOMEN PELVIS W CONTRAST 07/06/2020 1. Small-to-moderate volume hyperdense fluid predominantly within the right pericolic gutter and pelvis suggestive of hemoperitoneum, similar in volume to the prior study. Etiology remains uncertain on non-contrasted exam. 2. No significant interval change in size  and appearance of large somewhat ill-defined area of fluid density within the upper pole of the spleen measuring up to 9.1 cm, which may reflect splenic infarct, cyst, or possibly abscess. Additional areas of low density within the mid and inferior portions of the spleen with a more wedge-shaped configuration are similar to the prior study and are more likely to reflect splenic infarcts. 3. Redemonstration of a ovoid hyperdense structure within the right adnexal region measuring 5.0 x 3.1 cm, possibly large hyperdense adnexal cystic lesion. Recommend pelvic ultrasound for further evaluation. 4. Small right pleural effusion, similar to slightly progressed from yesterday's study. Increasing trace left pleural effusion.  Dependent bibasilar atelectasis and patchy ground-glass opacities within the visualized lung bases.   CT HEAD WO CONTRAST 07-10-20 Evolving large right MCA territory infarction with possible petechial hemorrhage. No discrete hematoma. No significant mass effect at this time.   CT HEAD WO CONTRAST 07/09/2020 Evolving large right MCA territory infarct status post decompression. No hemorrhagic transformation or midline shift.   DG Chest Portable 1 View 07/10/20 1. Interval intubation. Endotracheal tube terminates 2 cm above the carina. 2. Right IJ line is stable. 3. Low lung volumes and diffuse interstitial and airspace opacities bilaterally likely representing edema.  07-10-2020 1. Right IJ approach central line placed, tip at the lower SVC level. No adverse features. 2. Continued extensive perihilar opacity and indistinct vasculature favoring acute pulmonary edema.  06/26/2020 Cardiomegaly with findings of CHF. Pneumonia is not excluded.   Transthoracic Echocardiogram  07/10/20 1. Left ventricular ejection fraction, by estimation, is 60 to 65%. The left ventricle has normal function. The left ventricle has no regional wall motion abnormalities. Left ventricular diastolic  function could not be evaluated.  2. Right ventricular systolic function is normal. The right ventricular size is normal.  3. The mitral valve is normal in structure. Mild mitral valve regurgitation. No evidence of mitral stenosis.  4. Tricuspid valve regurgitation is mild to moderate.     5. There is at least one mobile shaggy density on the ventricular side of the AV. The valve is not visualized well enough to discern which cusp it emanates from but possibly involves more than 1 cusp. There is also concern for perivalvular abcess with flow into a small echolucent space in the area of the right coronary cusp. There is at least moderate and possibly severe aortic regurgiation. This is consistent with endocarditis. Recommend TEE.  5. The inferior vena cava is normal in size with greater than 50% respiratory variability, suggesting right atrial pressure of 3 mmHg.   TEE 07/09/2020  1. Left ventricular ejection fraction, by estimation, is 50 to 55%. The left ventricle has low normal function.  2. Right ventricular systolic function is normal. The right ventricular size is normal.  3. No left atrial/left atrial appendage thrombus was detected.  4. Large pleural effusion in the left lateral region.  5. The mitral valve is grossly normal. Moderate mitral valve regurgitation.  6. Aortic valve vegetation is visualized on the right.  7. The aortic valve is bicuspid. There is a large vegetation on the right coronary cusp. There is severe Aortic insufficiency.. The aortic valve is bicuspid. Aortic valve regurgitation is severe.   ECG - ST rate 120 BPM. (See cardiology reading for complete details)   PHYSICAL EXAM  Temp:  [99.7 F (37.6 C)-101.7 F (38.7 C)] 101 F (38.3 C) (12/17 0000) Pulse Rate:  [11-119] 95 (12/17 0300) Resp:  [16-39] 39 (12/17 0300) BP: (79-105)/(46-61) 88/54 (12/17 0300) SpO2:  [93 %-100 %] 99 % (12/17 0300) Arterial Line BP: (101-161)/(43-53) 122/44 (12/17 0300) FiO2 (%):  [40 %-60 %]  60 % (12/17 0131)  General - Well nourished, well developed, intubated on sedation.  Ophthalmologic - fundi not visualized due to noncooperation.  Cardiovascular - Regular rhythm and intermittent tachycardia.  Neuro - intubated on sedation, eyes closed, not following commands. With forced eye opening, eyes in mid position, not blinking to visual threat, doll's eyes absent, not tracking, pupil 52mm, not reactive to light. Corneal reflex weakly present on the right but not on the left, gag and cough present. Breathing over the vent.  Facial  symmetry not able to test due to ET tube.  Tongue protrusion not cooperative. On pain stimulation, no movement of all extremities. DTR diminished and no babinski. Sensation, coordination and gait not tested.   ASSESSMENT/PLAN Terri Oneal is a 39 y.o. female with history of active IV heroin abuser with a history of anxiety, depression, tobacco use, hypothyroidism, migraines and mitral valve prolapse who is brought in by EMS as a Code Stroke for acute onset of left hemiplegia with rightward gaze and left hemineglect. She did not receive IV t-PA due to late presentation (>4.5 hours from time of onset). Not an IR candidate based on perfusion images.  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 No intracranial hemorrhage. ASPECTS is 0   CTA H&N - Occlusion of the proximal right M1 segment with limited collateralization within the anterior right MCA territory.   CTP - 35 mL core infarct in the right MCA territory with 110 mL surrounding ischemic penumbra. (see full report above)  CT head - 12/15 - Extensive Right MCA territory infarct with cytotoxic edema and mild intracranial mass effect not significantly changed from 2138 hours yesterday.   2D Echo - EF 60-65%. Mobile shaggy density AV. Moderate to severe AR.  TEE EF 50-55%, large L pleural  effusion. AV large vegetation on R coronary cusp. Severe aortic insufficiency and regurgitation. No PFO  [employer] Virus 2 - negative  LDL - 26.4  HgbA1c - 4.5  UDS - negative  VTE prophylaxis - SCDsheparin  No antithrombotic prior to admission, now on no antithrombotics due to anemia needing PRBC and endocarditis as etiology of stroke  Therapy recommendations:  reconsult when pt able to tolerate  Disposition:  Pending - very poor prognosis, discussed with parents and they would like to absorb bad news today and may proceed with comfort care tomorrow  Cerebral edema Chronic hyponatremia  CT x 3 large right MCA infarct with mass effect  S/p decompressive hemicraniectomy  Na 130->138->140->146->147->145->150  3% saline @ 50-> NS @ 50  Given chronic hyponatremia will be careful for Na rise, no more than increase in 24h  Acute blood loss anemia Abdominal mass vs. Hemorrhagic cyst hemoperitoneum  Hb 8.0->8.6->6.5->6.1->8.4->8.2->8.8  CT A/P x 2 showed possible hemoperitoneum, spleen infarct vs. Abscess, large adnexal mass  PRBC transfusion  Albumin   CBC monitoring  NSG on board  Septic shock Large AV endocarditis Septic emboli (lung, spleen, SMA, adnexa)  Tachycardia HR 120s  Hypotension on pressors  Abdomen extension  Tmax 100.4->101.7  WBC 19.9->16.6->20.7->18.5  LA 3.2->1.6  CT C/A/P Spleen infarct vs. Abscess, bilateral lung septic emboli, SMA 7 mm aneurysm, right adnexa hyperdense lesion  IVDU  2D Echo - EF 60-65%. Mobile shaggy density AV. Moderate to severe AR.  TEE EF 50-55%, large L pleural effusion. AV vegetation on R coronary cusp. Severe aortic insufficiency and regurgitation.   Blood culture enterococcus faecalis  On cefepime and vanco -> Rocephin and ampicillin  CTS consult ([place]) no surgical plans until neuro prognosis more defined  Coagulopathy Thrombocytopenia DIC picture  INR 1.5-2.3  Platelet  108-31-30  Blood smear no schistocyte  UTI Renal failure  Creatinine 0.85-1.4  UA WBC 6-10  UCx >100k ecoli  On cefepime and vanco -> Rocephin and ampicillin  Respiratory failure  Intubated  On vent  On propofol/Neo/Precedex  CCM on board  Tobacco abuse  Current smoker  Smoking  cessation counseling will be provided  IVDU  Addicted to heroin  Cessation counseling will be provided  Dysphagia . Secondary to stroke . NPO . Cortrak w/ TF @ 40 and IV fluid at 100 . Speech on board  Other Stroke Risk Factors  ETOH use, advised to drink no more than 1 alcoholic beverage per day.  Migraines  Other Active Problems, Findings and Recommendations  Code status - Full code  MRSA nasal swab +  Hospital day # 3  This patient is critically ill due to septic shock, endocarditis, septic emboli, right large MCA stroke, status post decompressive hemicraniotomy, cerebral edema, hypotension, renal failure, DIC, some cytopenia, coagulopathy and at significant risk of neurological worsening, death form brain herniation, DIC, multiorgan failure, brain death, cardiovascular-. This patient's care requires constant monitoring of vital signs, hemodynamics, respiratory and cardiac monitoring, review of multiple databases, neurological assessment, discussion with family, other specialists and medical decision making of high complexity. I spent 50 minutes of neurocritical care time in the care of this patient. I had long discussion with parents at bedside, updated pt current condition, treatment plan and potential prognosis, and answered all the questions.  They expressed understanding and appreciation.  They would like to observe better news today and possibly proceed to comfort care tomorrow.  I also discussed with CCM resident.  Neurology has nothing more to offer at this time.  Will follow peripherally.  Please feel free to call with any questions.  Thanks for the consult.   Marvel Plan, MD PhD Stroke Neurology 07/10/2020 10:00 PM   To contact Stroke Continuity provider, please refer to [email or web address]. After hours, contact General Neurology

## 2020-07-10 NOTE — Progress Notes (Signed)
PHARMACY NOTE:  ANTIMICROBIAL RENAL DOSAGE ADJUSTMENT  Current antimicrobial regimen includes a mismatch between antimicrobial dosage and estimated renal function.  As per policy approved by the Pharmacy & Therapeutics and Medical Executive Committees, the antimicrobial dosage will be adjusted accordingly.  Current antimicrobial dosage:  Ampicillin 2gm IV Q4H  Indication: E.faecalis bacteremia / UTI / endocarditis   Renal Function:  Estimated Creatinine Clearance: 45.7 mL/min (A) (by C-G formula based on SCr of 1.4 mg/dL (H)). []      On intermittent HD, scheduled: []      On CRRT    Antimicrobial dosage has been changed to:  Ampicillin 2gm IV Q6H   Jameir Ake D. , PharmD, BCPS, BCCCP 07/10/2020, 7:58 AM

## 2020-07-10 NOTE — Progress Notes (Signed)
  NEUROSURGERY PROGRESS NOTE   Patient seen and examined. She is POD2 right hemicraniectomy for intracranial hypertension. Bandage remains in place, reinforced. Boggy. No active drainage. No further NS recs. Will need staples removed in 14 days. Please call for any concerns.  Cindra Presume, PA-C [facility]

## 2020-07-10 NOTE — Progress Notes (Signed)
PT Cancellation Note  Patient Details Name: Terri Oneal MRN: [id number] DOB: 08-May-1981   Cancelled Treatment:    Reason Eval/Treat Not Completed: Patient not medically ready. PT signing off. Please re-order, if med status improves, and pt able to participate.   Ilda Foil 07/10/2020, 8:26 AM   Aida Raider, PT  Office # [phone] Pager [phone]

## 2020-07-10 NOTE — Progress Notes (Signed)
Alerted CCM that patients feet are mottled and I am unable to doppler a pulse on the foot of her LRE.  I am able to doppler a pulse on her right popliteal.  Will continue to assess and await new orders.

## 2020-07-10 NOTE — Progress Notes (Signed)
NAME:  Terri Oneal, MRN:  [id number], DOB:  10/12/1980, LOS: 3 ADMISSION DATE:  09/07/2019, CONSULTATION DATE:  07/01/2020 REFERRING MD: Dr.Lindzen CHIEF COMPLAINT: left side weakness  Brief History   39 yo F w/ PMH of IVDU presenting with L hemiparesis and R gaze. Head CT showing large MCA infarct with midline shift  History of present illness   Terri Oneal is a 39 yo F w/ PMH of polysubstance abuse including IV heroin, prior endocarditis w/ mitral valve prolapse, h ypothyroidism who was  In her usual state of health until 9am yesterday. She was found down at home by her boyrfriend around 3pm in the afternoon with Left sided weakness. EMS was called ans [facility]. She was found to have rM1 occlusion with large MCA infarct on stat CT during code strokes. She was also found to have R paracloic gutter fluid and splenic infarct on CT abd/pelvis  PCCM consulted for ICU admission for further work up and management including hypertonic saline administration  Past Medical History  Polysubstance use Prior endocarditis w/ mitral valve prolapse Hypothyroidism Anxiety/depression  Significant Hospital Events   12/14 admit 12/15 resp distress, intubated  Consults:  Neurology, Neurosurgery, Cardiology, Infectious disease  Procedures:  12/15 central line insertion 12/15 intubation 12/15 Right decompressive craniotomy  Significant Diagnostic Tests:   CT angio head and neck 12/14 > occlusion of the proximal right M1 and collaterals resulting in large right MCA infarct  CT abdomen pelvis 12/14 > CT abdomen and pelvis which revealed complex free fluid in the right paracolic gutter suspicious for hemoperitoneum as well as splenomegaly with likely several areas of splenic infarcts  Cerebral perfusion study 12/14 > 35 mL core infarct in the right MCA territory with 110 mL surrounding ischemic penumbra.  Echocardiogram 12/15 > aortic valve vegetation  TEE 12/16 >  Aortic valve vegetation, severe aortic insufficiency, moderate mitral regurg  CT chest/abd/plevis w/ contrast 12/16> stable free fluid in abd/pelvis, Splenomegaly, Small bowel wall and colon thickening concerning for infectious vs ischemic, SMA aneurysm  Micro Data:  12/14 COVID neg 12/14 Blood culture >> enterococcus faecalies  Antimicrobials:  12/14 vanc >> d/c 12/14 cefepime >> d/c 12/15 ampicillin >> 12/15 ceftriaxone >>  Interim history/subjective:  O/N event: Noted to have purulent urine output, Hypotension requiring initiation of phenylephrine for pressor support  Ms.Czerwinski was examined and evaluated at bedside this am. She was observed well-sedated and minimally responsive. Minimally responsive. Noted to be breathing over vent.  Objective   Blood pressure (!) 92/52, pulse 100, temperature (!) 100.7 F (38.2 C), temperature source Axillary, resp. rate (!) 37, height 5\' 4"  (1.626 m), weight 63.6 kg, SpO2 99 %.    Vent Mode: PRVC FiO2 (%):  [40 %-60 %] 60 % Set Rate:  [16 bmp] 16 bmp Vt Set:  [430 mL] 430 mL PEEP:  [8 cmH20] 8 cmH20 Plateau Pressure:  [21 cmH20-33 cmH20] 33 cmH20   Intake/Output Summary (Last 24 hours) at 07/10/2020 0708 Last data filed at 07/10/2020 0600 Gross per 24 hour  Intake 4217.65 ml  Output 475 ml  Net 3742.65 ml   Filed Weights   07/07/2020 1000 07/10/20 0500  Weight: 54.3 kg 63.6 kg   Examination:  Gen: Well-developed, ill-appearing HEENT: NCAT head, icteric sclerae Neck: supple, ROM intact, central line intact CV: Tachycardic, regular rhythm, S1, S2 normal, No rubs, no murmurs, no gallops Pulm: Tachypneic, bibasilar rales, no wheezes Abd: Soft, absent bowel sounds, distended Extm: ROM intact,  2+ pitting edema, diminished peripheral pulses Skin: Dry, Warm, jaundiced, multiple areas of small ecchymosis Neuro: [facility]   Resolved Hospital Problem list     Assessment & Plan:   Sepsis Enterococcus faecalis bacteremia Acute  hypoxic respiratory failure requiring mechanical ventilation Noted to have leukocytosis, hypotension, tachycardia, tachypnea. Has hx of sepsis with endocarditis due to IV drug use. Started on Neo yesterday. Blood culture + for enterococcus faecalis. ID on board. Getting ampicillin, ceftriaxone. Wbc down-trending 24.5->18.5->21. Continues to be tachycardic, febrile. 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 On antibiotics. TEE confirm vegetation and insufficiency. Cardiothoracic surgery consulted yesterday recommending awaiting neurological prognosis. CT chest/abd/pelvis showing evidence of multi-organ infarct involving spleen, peripheral extremities, small bowel, colon, lungs. - C/w antibiotics - Monitor volume status  Large acute/subacute right MCA CVA Present w/ L hemiplegia. Outside tpa window. Not thrombectomy candidate. Neuro on board. S/p craniotomy w/ neurosurgery. Off hypertonic saline 08/01/2020. Based on area of infarct, prognosis for neurological recovery is poor - Appreciate neuro / neurosurgery recs - Aspiration/seizure precautions - Neurochecks  Acute Kidney Injury Making very small urine around with 475cc yesterday despite fluids. CT abd/pelvis showing renal dysfunction prior to contrast administration. Likely in setting of ATN from shock as currently requiring pressors. Creatinine 0.85->1.4. Starting to appear hypervolemic. Will need to limit fluid - Reduced tube feed amount / maintenance fluid as tolerated - Trend renal fx - Avoid nephrotoxic meds  Hyperbilirubinemia Acute hemolytic anemia on chronic anemia + Hep C antibody Thrombocytopenia likely due to DIC Noted to have acute rise in bilirubin from 3.4->7.6->9.6.  No biliary disease based on CT abd/pelvis w/o contrast. Required 2 transfusions yesterday due to hgb drop from 8.0->6.1. Currently stable. 8.5->8.7->8.4 LDH, Retic elevated, Haptoglobin low consistent with hemolytic anemia. Likely splenic ischemia and hep C contributing. Platelet count drop from 100->30. Likely due to DIC based on prior labs. Will get blood smear with dic panel to confirm. CT scan does not show any evidence of active hemorrahge.  - F/u DIC panel - Monitor cbc - Transfuse if <7  Complex free peritoneal fluid  Possibly due to ovarian cyst rupture. Stable on repeat imaging - C/w broad spectrum IV - F/u urine culture  Polysubstance abuse Patient reported use of IV heroin 1 day prior to admission - Monitor for withdrawal sxs - Need to pursue cessation  Best practice (evaluated daily)   Diet: NPO Pain/Anxiety/Delirium protocol (if indicated): fentanyl VAP protocol (if indicated): Y DVT prophylaxis: scd GI prophylaxis: PPI Glucose control: N/A Mobility: BR last date of multidisciplinary goals of care discussion 2020-08-01 Family and staff present Y Summary of discussion Continue full scope Follow up goals of care discussion due 07/09/20 Code Status: Full Disposition: ICU  Labs   CBC: Recent Labs  Lab 07/12/2020 2136 07/11/2020 2210 07/09/2020 2342 08/01/20 0757 2020-08-01 1021 August 01, 2020 1648 08/01/20 2117 07/09/20 0309 07/09/20 0757 07/10/20 0510  WBC 19.9*  --  16.6* 20.7*  --   --  24.5* 18.5*  --  21.0*  NEUTROABS 17.7*  --   --   --   --   --   --   --   --  15.2*  HGB 8.0*   < > 8.6* 6.5*   < > 8.5* 8.7* 8.4* 8.2* 8.6*  HCT 26.9*   < >  28.6* 21.1*   < > 25.0* 28.6* 25.8* 24.0* 29.3*  MCV 95.4  --  94.1 92.5  --   --  94.4 91.5  --  99.3  PLT 103*  --  83* 113*  --   --  115* 108*  --  31*   < > = values in this interval not displayed.    Basic Metabolic Panel: Recent Labs  Lab 07-12-20 2136 2020-07-12 2210 07/12/20 2222 Jul 12, 2020 2342 07/01/2020 0757  07/19/2020 1021 07/14/2020 1137 07/20/2020 1648 07/22/2020 2117 07/09/20 0309 07/09/20 0757 07/09/20 1159 07/10/20 0510  NA 132* 132*   < >  --  138 140   < > 144 144 146* 147* 145 149*  K 4.1 4.1  --   --  3.8 3.7  --  3.7  --  3.7 3.5  --  4.3  CL 99 100  --   --  106  --   --   --   --  116*  --   --  120*  CO2 20*  --   --   --  21*  --   --   --   --  19*  --   --  10*  GLUCOSE 111* 108*  --   --  93  --   --   --   --  94  --   --  91  BUN 20 22*  --   --  19  --   --   --   --  20  --   --  31*  CREATININE 0.97 0.80  --  0.75 0.77  --   --   --   --  0.85  --   --  1.40*  CALCIUM 7.9*  --   --   --  7.5*  --   --   --   --  7.6*  --   --  7.5*  MG  --   --   --   --   --   --   --   --   --  2.1  --   --  2.2  PHOS  --   --   --   --   --   --   --   --   --  3.9  --   --  5.8*   < > = values in this interval not displayed.   GFR: Estimated Creatinine Clearance: 45.7 mL/min (A) (by C-G formula based on SCr of 1.4 mg/dL (H)). Recent Labs  Lab 2020/07/12 2239 07/12/2020 2342 07/11/2020 0757 07/06/2020 2117 07/09/20 0309 07/10/20 0510  WBC  --    < > 20.7* 24.5* 18.5* 21.0*  LATICACIDVEN 3.2*  --  1.6  --   --   --    < > = values in this interval not displayed.    Liver Function Tests: Recent Labs  Lab 2020-07-12 2136 07/09/20 0309 07/10/20 0510  AST 42* 34 92*  ALT 18 15 16   ALKPHOS 157* 102 90  BILITOT 3.5* 7.6* 9.5*  PROT 6.2* 4.7* 5.0*  ALBUMIN 1.9* 1.7* 1.8*   No results for input(s): LIPASE, AMYLASE in the last 168 hours. No results for input(s): AMMONIA in the last 168 hours.  ABG    Component Value Date/Time   PHART 7.481 (H) 07/09/2020 0757   PCO2ART 25.1 (L) 07/09/2020 0757   PO2ART 73 (L) 07/09/2020 0757   HCO3 18.5 (L) 07/09/2020 07/11/2020  TCO2 19 (L) 07/09/2020 0757   ACIDBASEDEF 4.0 (H) 07/09/2020 0757   O2SAT 95.0 07/09/2020 0757     Coagulation Profile: Recent Labs  Lab 07-10-20 2136 06/25/2020 1137  INR 1.4* 1.5*    Cardiac Enzymes: No results  for input(s): CKTOTAL, CKMB, CKMBINDEX, TROPONINI in the last 168 hours.  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   Surgical History    Past Surgical History:  Procedure Laterality Date  . CRANIOTOMY N/A 07/12/2020   Procedure: RIGHT DECOMPRESSIVE HEMI CRANIECTOMY;  Surgeon: Lisbeth Renshaw, MD;  Location: [facility] OR;  Service: Neurosurgery;  Laterality: N/A;  . KNEE ARTHROPLASTY    . KNEE SURGERY    . MOUTH SURGERY       Social History   reports that she has been smoking cigarettes. She has been smoking about 1.00 pack per day. She has never used smokeless tobacco. She reports current alcohol use. She reports current drug use. Drugs: Heroin and IV.   Family History   Her family history includes Diabetes in an other family member; Hypertension in an other family member; Thyroid disease in an other family member.   Allergies No Known Allergies   Home Medications  Prior to Admission medications   Medication Sig Start Date End Date Taking? Authorizing Provider  ibuprofen (ADVIL) 200 MG tablet Take 600-800 mg by mouth every 6 (six) hours as needed for headache or mild pain.   Yes [provider]    Theotis Barrio, MD 07/10/2020, 7:08 AM PGY-3, [facility]  Internal Medicine Pager: [phone]  Critical care time:

## 2020-07-10 NOTE — Progress Notes (Signed)
Pt transported from 4N23 to CT and back with no complications.  

## 2020-07-10 NOTE — Progress Notes (Addendum)
ABI completed. Refer to "CV Proc" under chart review to view preliminary results.  Preliminary results discussed with Dr. Wynona Neat.  07/10/2020 10:02 AM Eula Fried., MHA, RVT, RDCS, RDMS  for [facility], RVT, RDMS

## 2020-07-10 NOTE — Progress Notes (Signed)
Alerted Neuro MD Dr. Otelia Limes that patient was not withdrawing to pain on any extremities despite pausing propofol for a prolonged period of time.  Was given orders to obtain a stat head CT.  Will complete and continue to assess and act accordingly.

## 2020-07-10 NOTE — Progress Notes (Signed)
Family would like to return in the morning at 0730 for extubation and comfort care measures. RN will continue to monitor.

## 2020-07-10 NOTE — Progress Notes (Signed)
RN received call from [facility]. Patient not a candidate for organ donation. Please call back with time of death to reassess for tissue or eye consideration.

## 2020-07-10 NOTE — Progress Notes (Signed)
CRITICAL VALUE ALERT  Critical Value:  Lactic acid 5.6  Date & Time Notied:  07/10/2020   9672  Provider Notified: Dr. Wynona Neat CCMD  Orders Received/Actions taken: No new orders. Continue to monitor

## 2020-07-10 NOTE — Progress Notes (Addendum)
Subjective:  on the ventilator   Antibiotics:  Anti-infectives (From admission, onward)   Start     Dose/Rate Route Frequency Ordered Stop   07/10/20 1100  ampicillin (OMNIPEN) 2 g in sodium chloride 0.9 % 100 mL IVPB        2 g 300 mL/hr over 20 Minutes Intravenous Every 6 hours 07/10/20 0758     07/23/2020 1700  cefTRIAXone (ROCEPHIN) 2 g in sodium chloride 0.9 % 100 mL IVPB        2 g 200 mL/hr over 30 Minutes Intravenous Every 12 hours 07/21/2020 1405     07/05/2020 1600  ampicillin (OMNIPEN) 2 g in sodium chloride 0.9 % 100 mL IVPB  Status:  Discontinued        2 g 300 mL/hr over 20 Minutes Intravenous Every 4 hours 07/09/2020 1405 07/10/20 0758   07/05/2020 1545  ceFAZolin (ANCEF) IVPB 2g/100 mL premix  Status:  Discontinued        2 g 200 mL/hr over 30 Minutes Intravenous On call to O.R. 07/05/2020 1445 06/25/2020 1447   06/30/2020 0830  ceFEPIme (MAXIPIME) 2 g in sodium chloride 0.9 % 100 mL IVPB  Status:  Discontinued        2 g 200 mL/hr over 30 Minutes Intravenous Every 8 hours 06/24/2020 0720 07/23/2020 1405   07/18/2020 0800  vancomycin (VANCOCIN) IVPB 1000 mg/200 mL premix  Status:  Discontinued        1,000 mg 200 mL/hr over 60 Minutes Intravenous Every 8 hours 07/21/2020 2339 06/24/2020 1405   07/18/2020 0800  piperacillin-tazobactam (ZOSYN) IVPB 3.375 g  Status:  Discontinued        3.375 g 12.5 mL/hr over 240 Minutes Intravenous Every 8 hours 06/27/2020 2339 07/11/2020 0720   07/06/2020 2345  vancomycin (VANCOREADY) IVPB 1250 mg/250 mL        1,250 mg 166.7 mL/hr over 90 Minutes Intravenous STAT 07/21/2020 2335 06/28/2020 0331   06/26/2020 2345  piperacillin-tazobactam (ZOSYN) IVPB 3.375 g        3.375 g 100 mL/hr over 30 Minutes Intravenous  Once 06/29/2020 2336 07/12/2020 0036   07/02/2020 2330  ceFEPIme (MAXIPIME) 2 g in sodium chloride 0.9 % 100 mL IVPB  Status:  Discontinued        2 g 200 mL/hr over 30 Minutes Intravenous  Once 06/27/2020 2327 07/02/2020 2336   06/29/2020 2330  metroNIDAZOLE  (FLAGYL) IVPB 500 mg  Status:  Discontinued        500 mg 100 mL/hr over 60 Minutes Intravenous  Once 07/06/2020 2327 07/22/2020 2336   06/24/2020 2330  vancomycin (VANCOCIN) IVPB 1000 mg/200 mL premix  Status:  Discontinued        1,000 mg 200 mL/hr over 60 Minutes Intravenous  Once 06/30/2020 2327 07/15/2020 2335      Medications: Scheduled Meds: . chlorhexidine gluconate (MEDLINE KIT)  15 mL Mouth Rinse BID  . Chlorhexidine Gluconate Cloth  6 each Topical Q0600  . Chlorhexidine Gluconate Cloth  6 each Topical Daily  . docusate  100 mg Per Tube BID  . feeding supplement (PROSource TF)  45 mL Per Tube BID  . feeding supplement (VITAL HIGH PROTEIN)  1,000 mL Per Tube Q24H  . heparin  5,000 Units Subcutaneous Q8H  . mouth rinse  15 mL Mouth Rinse 10 times per day  . mupirocin ointment  1 application Nasal BID  . pantoprazole (PROTONIX) IV  40 mg Intravenous Q24H  . polyethylene  glycol  17 g Per Tube Daily  . sodium chloride flush  10-40 mL Intracatheter Q12H   Continuous Infusions: . sodium chloride Stopped (07/03/2020 1758)  . sodium chloride Stopped (07/09/20 1630)  . ampicillin (OMNIPEN) IV    . cefTRIAXone (ROCEPHIN)  IV Stopped (07/10/20 0626)  . dexmedetomidine (PRECEDEX) IV infusion Stopped (07/09/20 1833)  . phenylephrine (NEO-SYNEPHRINE) Adult infusion 60 mcg/min (07/10/20 0900)  . propofol (DIPRIVAN) infusion 40 mcg/kg/min (07/10/20 0900)  . sodium bicarbonate in D5W 1000 mL infusion 75 mL/hr at 07/10/20 0900   PRN Meds:.sodium chloride, acetaminophen (TYLENOL) oral liquid 160 mg/5 mL, fentaNYL (SUBLIMAZE) injection, fentaNYL (SUBLIMAZE) injection, ondansetron (ZOFRAN) IV, sodium chloride flush    Objective: Weight change: 9.3 kg  Intake/Output Summary (Last 24 hours) at 07/10/2020 1016 Last data filed at 07/10/2020 0900 Gross per 24 hour  Intake 4246.9 ml  Output 535 ml  Net 3711.9 ml   Blood pressure (!) 98/52, pulse 100, temperature (!) 100.9 F (38.3 C), temperature  source Axillary, resp. rate (!) 37, height _0  (1.626 m), weight 63.6 kg, SpO2 100 %. Temp:  [100.7 F (38.2 C)-101.7 F (38.7 C)] 100.9 F (38.3 C) (12/17 0753) Pulse Rate:  [11-119] 100 (12/17 0900) Resp:  [16-40] 37 (12/17 0900) BP: (79-105)/(46-61) 98/52 (12/17 0900) SpO2:  [94 %-100 %] 100 % (12/17 0900) Arterial Line BP: (101-161)/(40-54) 136/44 (12/17 0900) FiO2 (%):  [60 %] 60 % (12/17 0758) Weight:  [63.6 kg] 63.6 kg (12/17 0500)  Physical Exam: Physical Exam Constitutional:      Appearance: She is ill-appearing and diaphoretic.     Interventions: She is intubated.  HENT:     Head:   Cardiovascular:     Rate and Rhythm: Tachycardia present.     Heart sounds: Murmur heard.    Pulmonary:     Effort: She is intubated.  Skin:    General: Skin is cool.     Coloration: Skin is pale.   Track marks  Patient's lower extremities become cold and mottled  Sedated CBC:    BMET Recent Labs    07/09/20 0309 07/09/20 0757 07/10/20 0510 07/10/20 0806  NA 146*   < > 149* 150*  K 3.7   < > 4.3 4.1  CL 116*  --  120*  --   CO2 19*  --  10*  --   GLUCOSE 94  --  91  --   BUN 20  --  31*  --   CREATININE 0.85  --  1.40*  --   CALCIUM 7.6*  --  7.5*  --    < > = values in this interval not displayed.     Liver Panel  Recent Labs    07/09/20 0309 07/10/20 0510  PROT 4.7* 5.0*  ALBUMIN 1.7* 1.8*  AST 34 92*  ALT 15 16  ALKPHOS 102 90  BILITOT 7.6* 9.5*       Sedimentation Rate No results for input(s): ESRSEDRATE in the last 72 hours. C-Reactive Protein No results for input(s): CRP in the last 72 hours.  Micro Results: Recent Results (from the past 720 hour(s))  Resp Panel by RT-PCR (Flu A&B, Covid) Nasopharyngeal Swab     Status: None   Collection Time: 07/01/2020  9:53 PM   Specimen: Nasopharyngeal Swab; Nasopharyngeal(NP) swabs in vial transport medium  Result Value Ref Range Status   SARS Coronavirus 2 by RT PCR NEGATIVE NEGATIVE Final     Comment: (NOTE) SARS-CoV-2 target nucleic acids are NOT  DETECTED.  The SARS-CoV-2 RNA is generally detectable in upper respiratory specimens during the acute phase of infection. The lowest concentration of SARS-CoV-2 viral copies this assay can detect is 138 copies/mL. A negative result does not preclude SARS-Cov-2 infection and should not be used as the sole basis for treatment or other patient management decisions. A negative result may occur with  improper specimen collection/handling, submission of specimen other than nasopharyngeal swab, presence of viral mutation(s) within the areas targeted by this assay, and inadequate number of viral copies(<138 copies/mL). A negative result must be combined with clinical observations, patient history, and epidemiological information. The expected result is Negative.  Fact Sheet for Patients:  EntrepreneurPulse.com.au  Fact Sheet for Healthcare Providers:  IncredibleEmployment.be  This test is no t yet approved or cleared by the [place] FDA and  has been authorized for detection and/or diagnosis of SARS-CoV-2 by FDA under an Emergency Use Authorization (EUA). This EUA will remain  in effect (meaning this test can be used) for the duration of the COVID-19 declaration under Section 564(b)(1) of the Act, 21 U.S.C.section 360bbb-3(b)(1), unless the authorization is terminated  or revoked sooner.       Influenza A by PCR NEGATIVE NEGATIVE Final   Influenza B by PCR NEGATIVE NEGATIVE Final    Comment: (NOTE) The Xpert Xpress SARS-CoV-2/FLU/RSV plus assay is intended as an aid in the diagnosis of influenza from Nasopharyngeal swab specimens and should not be used as a sole basis for treatment. Nasal washings and aspirates are unacceptable for Xpert Xpress SARS-CoV-2/FLU/RSV testing.  Fact Sheet for Patients: EntrepreneurPulse.com.au  Fact Sheet for Healthcare  Providers: IncredibleEmployment.be  This test is not yet approved or cleared by the [place] FDA and has been authorized for detection and/or diagnosis of SARS-CoV-2 by FDA under an Emergency Use Authorization (EUA). This EUA will remain in effect (meaning this test can be used) for the duration of the COVID-19 declaration under Section 564(b)(1) of the Act, 21 U.S.C. section 360bbb-3(b)(1), unless the authorization is terminated or revoked.  Performed at [facility], [place] [address] [place], [place] [address]   Culture, blood (single)     Status: Abnormal   Collection Time: 07/06/2020 11:40 PM   Specimen: BLOOD  Result Value Ref Range Status   Specimen Description BLOOD RIGHT ANTECUBITAL  Final   Special Requests   Final    BOTTLES DRAWN AEROBIC AND ANAEROBIC Blood Culture adequate volume   Culture  Setup Time   Final    GRAM POSITIVE COCCI IN CHAINS IN BOTH AEROBIC AND ANAEROBIC BOTTLES Organism ID to follow CRITICAL RESULT CALLED TO, READ BACK BY AND VERIFIED WITH: Ferne Coe PharmD 14:00 07/12/2020 ([email or web address]) Performed at [facility], [place] [address], [place], [place] [address]    Culture ENTEROCOCCUS FAECALIS (A)  Final   Report Status 07/10/2020 FINAL  Final   Organism ID, Bacteria ENTEROCOCCUS FAECALIS  Final      Susceptibility   Enterococcus faecalis - MIC*    AMPICILLIN <=2 SENSITIVE Sensitive     VANCOMYCIN 2 SENSITIVE Sensitive     GENTAMICIN SYNERGY SENSITIVE Sensitive     * ENTEROCOCCUS FAECALIS  Culture, blood (routine x 2)     Status: Abnormal   Collection Time: 06/24/2020 11:40 PM   Specimen: BLOOD  Result Value Ref Range Status   Specimen Description BLOOD RIGHT BICEP  Final   Special Requests   Final    BOTTLES DRAWN AEROBIC AND ANAEROBIC Blood Culture adequate volume  Culture  Setup Time   Final    GRAM POSITIVE COCCI IN CHAINS IN BOTH AEROBIC AND ANAEROBIC BOTTLES IDENTIFICATION TO FOLLOW CRITICAL RESULT CALLED TO, READ  BACK BY AND VERIFIED WITH: Ferne Coe PharmD 14:00 07/14/2020 ([email or web address])    Culture (A)  Final    ENTEROCOCCUS FAECALIS SUSCEPTIBILITIES PERFORMED ON PREVIOUS CULTURE WITHIN THE LAST 5 DAYS. Performed at [facility], [place] [address] [place], [place] [address]    Report Status 07/10/2020 FINAL  Final  Blood Culture ID Panel (Reflexed)     Status: Abnormal   Collection Time: 07/24/2020 11:40 PM  Result Value Ref Range Status   Enterococcus faecalis DETECTED (A) NOT DETECTED Final    Comment: CRITICAL RESULT CALLED TO, READ BACK BY AND VERIFIED WITH: Ferne Coe PharmD 14:00 06/28/2020 ([email or web address])    Enterococcus Faecium NOT DETECTED NOT DETECTED Final   Listeria monocytogenes NOT DETECTED NOT DETECTED Final   Staphylococcus species NOT DETECTED NOT DETECTED Final   Staphylococcus aureus (BCID) NOT DETECTED NOT DETECTED Final   Staphylococcus epidermidis NOT DETECTED NOT DETECTED Final   Staphylococcus lugdunensis NOT DETECTED NOT DETECTED Final   Streptococcus species NOT DETECTED NOT DETECTED Final   Streptococcus agalactiae NOT DETECTED NOT DETECTED Final   Streptococcus pneumoniae NOT DETECTED NOT DETECTED Final   Streptococcus pyogenes NOT DETECTED NOT DETECTED Final   A.calcoaceticus-baumannii NOT DETECTED NOT DETECTED Final   Bacteroides fragilis NOT DETECTED NOT DETECTED Final   Enterobacterales NOT DETECTED NOT DETECTED Final   Enterobacter cloacae complex NOT DETECTED NOT DETECTED Final   Escherichia coli NOT DETECTED NOT DETECTED Final   Klebsiella aerogenes NOT DETECTED NOT DETECTED Final   Klebsiella oxytoca NOT DETECTED NOT DETECTED Final   Klebsiella pneumoniae NOT DETECTED NOT DETECTED Final   Proteus species NOT DETECTED NOT DETECTED Final   Salmonella species NOT DETECTED NOT DETECTED Final   Serratia marcescens NOT DETECTED NOT DETECTED Final   Haemophilus influenzae NOT DETECTED NOT DETECTED Final   Neisseria meningitidis NOT DETECTED NOT DETECTED Final   Pseudomonas  aeruginosa NOT DETECTED NOT DETECTED Final   Stenotrophomonas maltophilia NOT DETECTED NOT DETECTED Final   Candida albicans NOT DETECTED NOT DETECTED Final   Candida auris NOT DETECTED NOT DETECTED Final   Candida glabrata NOT DETECTED NOT DETECTED Final   Candida krusei NOT DETECTED NOT DETECTED Final   Candida parapsilosis NOT DETECTED NOT DETECTED Final   Candida tropicalis NOT DETECTED NOT DETECTED Final   Cryptococcus neoformans/gattii NOT DETECTED NOT DETECTED Final   Vancomycin resistance NOT DETECTED NOT DETECTED Final    Comment: Performed at [facility], [address], [place], [place] [address]  Urine Culture     Status: Abnormal   Collection Time: 07/19/2020 12:36 AM   Specimen: Urine, Catheterized  Result Value Ref Range Status   Specimen Description URINE, CATHETERIZED  Final   Special Requests   Final    NONE Performed at [facility], [place] [address], [place],  [address]    Culture >=100,000 COLONIES/mL ESCHERICHIA COLI (A)  Final   Report Status 07/10/2020 FINAL  Final   Organism ID, Bacteria ESCHERICHIA COLI (A)  Final      Susceptibility   Escherichia coli - MIC*    AMPICILLIN <=2 SENSITIVE Sensitive     CEFAZOLIN <=4 SENSITIVE Sensitive     CEFEPIME <=0.12 SENSITIVE Sensitive     CEFTRIAXONE <=0.25 SENSITIVE Sensitive     CIPROFLOXACIN <=0.25 SENSITIVE Sensitive     GENTAMICIN <=1  SENSITIVE Sensitive     IMIPENEM <=0.25 SENSITIVE Sensitive     NITROFURANTOIN <=16 SENSITIVE Sensitive     TRIMETH/SULFA <=20 SENSITIVE Sensitive     AMPICILLIN/SULBACTAM <=2 SENSITIVE Sensitive     PIP/TAZO <=4 SENSITIVE Sensitive     * >=100,000 COLONIES/mL ESCHERICHIA COLI  Surgical pcr screen     Status: Abnormal   Collection Time: 07/18/2020  2:24 PM   Specimen: Nasal Mucosa; Nasal Swab  Result Value Ref Range Status   MRSA, PCR NEGATIVE NEGATIVE Final   Staphylococcus aureus POSITIVE (A) NEGATIVE Final    Comment: (NOTE) The Xpert SA Assay (FDA approved  for NASAL specimens in patients 14 years of age and older), is one component of a comprehensive surveillance program. It is not intended to diagnose infection nor to guide or monitor treatment. Performed at [facility], [place] [address] [place], [place] [address]   Culture, blood (Routine X 2) w Reflex to ID Panel     Status: None (Preliminary result)   Collection Time: 07/09/20 11:58 AM   Specimen: BLOOD  Result Value Ref Range Status   Specimen Description BLOOD CENTRAL LINE  Final   Special Requests   Final    BOTTLES DRAWN AEROBIC AND ANAEROBIC Blood Culture adequate volume   Culture   Final    NO GROWTH < 24 HOURS Performed at [facility], [place] [address], [place], [place] [address]    Report Status PENDING  Incomplete  Culture, blood (Routine X 2) w Reflex to ID Panel     Status: None (Preliminary result)   Collection Time: 07/09/20 11:58 AM   Specimen: BLOOD  Result Value Ref Range Status   Specimen Description BLOOD CENTRAL LINE  Final   Special Requests   Final    BOTTLES DRAWN AEROBIC AND ANAEROBIC Blood Culture adequate volume   Culture   Final    NO GROWTH < 24 HOURS Performed at [facility], [place] [address] [place], [place] [address]    Report Status PENDING  Incomplete    Studies/Results: CT ABDOMEN PELVIS WO CONTRAST  Result Date: 07/17/2020 CLINICAL DATA:  Follow-up hemoperitoneum EXAM: CT ABDOMEN AND PELVIS WITHOUT CONTRAST TECHNIQUE: Multidetector CT imaging of the abdomen and pelvis was performed following the standard protocol without IV contrast. COMPARISON:  07/15/2020 FINDINGS: Lower chest: Small right pleural effusion, similar to slightly progressed from yesterday's study. Increasing trace left pleural effusion. Dependent bibasilar atelectasis and patchy ground-glass opacities within the visualized lung bases. Hepatobiliary: Unremarkable unenhanced appearance of the liver. No focal liver lesion identified. Excreted contrast within the  gallbladder. Gallbladder is otherwise within normal limits. No hyperdense gallstone. No biliary dilatation. Pancreas: Grossly unremarkable Spleen: Splenomegaly. Large somewhat ill-defined area of fluid density within the upper pole of the spleen measuring up to 9.1 cm is similar in size and appearance to the previous study, remeasured. Additional areas of low density within the mid and inferior portions of the spleen with a more wedge-shaped configuration are similar to the prior study and may reflect splenic infarcts. Adrenals/Urinary Tract: Unremarkable adrenal glands. Small amount of excreted contrast within the kidneys and ureters. Kidneys are normal in size without focal lesion, stone, or hydronephrosis. Excreted contrast within the urinary bladder. Stomach/Bowel: Stomach and bowel are within normal limits. No dilated loops of bowel. No inflammatory changes are evident. Vascular/Lymphatic: No significant vascular findings evident on non contrasted exam. Evaluation of adenopathy is somewhat limited in the absence of intravenous contrast and relative paucity  of intra-abdominal fat. No definite abdominopelvic lymphadenopathy. Reproductive: Uterus is grossly unremarkable. Redemonstration of a ovoid somewhat hyperdense structure within the right adnexal region measuring 5.0 x 3.1 cm (series 3, image 74), possibly large hyperdense adnexal cyst. Other: Small to moderate volume hyperdense fluid is again seen predominantly within the right pericolic gutter and pelvis, similar in volume to the prior study. No pneumoperitoneum. Musculoskeletal: Mild anasarca. No acute osseous finding. No suspicious bone lesion. No significant degenerative findings. IMPRESSION: 1. Small-to-moderate volume hyperdense fluid predominantly within the right pericolic gutter and pelvis suggestive of hemoperitoneum, similar in volume to the prior study. Etiology remains uncertain on non-contrasted exam. 2. No significant interval change in size  and appearance of large somewhat ill-defined area of fluid density within the upper pole of the spleen measuring up to 9.1 cm, which may reflect splenic infarct, cyst, or possibly abscess. Additional areas of low density within the mid and inferior portions of the spleen with a more wedge-shaped configuration are similar to the prior study and are more likely to reflect splenic infarcts. 3. Redemonstration of a ovoid hyperdense structure within the right adnexal region measuring 5.0 x 3.1 cm, possibly large hyperdense adnexal cystic lesion. Recommend pelvic ultrasound for further evaluation. 4. Small right pleural effusion, similar to slightly progressed from yesterday's study. Increasing trace left pleural effusion. Dependent bibasilar atelectasis and patchy ground-glass opacities within the visualized lung bases. Electronically Signed   By: Davina Poke D.O.   On: 07/04/2020 12:38   CT HEAD WO CONTRAST  Result Date: 07/10/2020 CLINICAL DATA:  Intravenous drug use, endocarditis, stroke. Follow-up examination. EXAM: CT HEAD WITHOUT CONTRAST TECHNIQUE: Contiguous axial images were obtained from the base of the skull through the vertex without intravenous contrast. COMPARISON:  07/09/2020, 07/09/2020 FINDINGS: Brain: Right frontotemporal craniectomy and decompression is again identified. Large right subacute MCA territory infarct is again identified with loss of gray-white matter differentiation, sulcal effacement, and mild mass effect leading to effacement of the right lateral ventricle and approximately 4-5 mm right to left midline shift, improved since prior examination. Mild subarachnoid hemorrhage again noted subjacent to the craniectomy defect. Hyperdense right MCA/M1 again identified. Involving infarct involving the right basal ganglia and caudate nucleus. No interval hemorrhage. No interval acute infarct. Ventricular size is normal. Cerebellum is unchanged with a tiny remote infarct within the right  cerebellar hemisphere. Vascular: Right hyperdense MCA again identified. Skull: Right craniectomy defect again identified. Sinuses/Orbits: The visualized paranasal sinuses are clear. Orbits are unremarkable. Other: Mastoid air cells and middle ear cavities are clear. IMPRESSION: Stable examination with evolving large right MCA territory infarct status post decompression. Minimal subarachnoid hemorrhage overlying the right cerebral hemisphere subjacent to the craniectomy defect. Mild mass effect with improving midline shift, now 4-5 mm. No interval hemorrhage or infarct. Electronically Signed   By: Fidela Salisbury MD   On: 07/10/2020 01:41   CT HEAD WO CONTRAST  Result Date: 07/09/2020 CLINICAL DATA:  Stroke follow-up. EXAM: CT HEAD WITHOUT CONTRAST TECHNIQUE: Contiguous axial images were obtained from the base of the skull through the vertex without intravenous contrast. COMPARISON:  Head CT July 08, 2020. FINDINGS: Brain: Evolving large infarct involving most of the right MCA territory status post decompression with loss of the gray-white differentiation, effacement of the corresponding an adjacent cerebral sulci and mass effect on the right lateral ventricle. No hydrocephalus, entrapment or significant midline shift. No hemorrhagic transformation. Vascular: Unchanged hyperdense right carotid terminus and right M1/MCA with vascular calcification in the right ICA terminus and  in the right sylvian fissure, likely within and M2 branch. Skull: Postsurgical changes from right craniectomy without evidence of complication. Sinuses/Orbits: Mild mucosal thickening of the left sphenoid sinus. The orbits are maintained. 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 No hemorrhagic transformation or midline shift. Electronically Signed   By: Pedro Earls M.D.   On: 07/09/2020 16:23   CT CHEST ABDOMEN PELVIS W CONTRAST  Result Date: 07/09/2020 CLINICAL DATA:   Increased abdominal girth. Abdominal distension with significant increasing growth and tenderness to palpation this morning. EXAM: CT CHEST, ABDOMEN, AND PELVIS WITH CONTRAST TECHNIQUE: Multidetector CT imaging of the chest, abdomen and pelvis was performed following the standard protocol during bolus administration of intravenous contrast. CONTRAST:  116m OMNIPAQUE IOHEXOL 300 MG/ML  SOLN COMPARISON:  Recent chest radiographs FINDINGS: CT CHEST FINDINGS Cardiovascular: Aortic valve calcifications as well as ill-defined low-density likely vegetations. No aortic dissection. There are no filling defects in the pulmonary arteries to suggest pulmonary embolus on this non dedicated CTA exam. Heart is normal in size. No significant pericardial effusion. Right internal jugular central line in place, tip obscured by dense IV contrast Mediastinum/Nodes: Assessment for hilar adenopathy is limited due to adjacent pleural fluid and atelectasis. Small mediastinal lymph nodes are not enlarged by size criteria. Endotracheal tube tip above the carina. Enteric tube decompresses the esophagus. Lungs/Pleura: Moderately large bilateral pleural effusions. Adjacent compressive atelectasis with near complete atelectasis of the left lower lobe. Patchy and heterogeneous bilateral lung opacities in the aerated lungs, some of which are nodular. Occasional areas of septal thickening. No debris in the trachea or mainstem bronchi. Musculoskeletal: There are no acute or suspicious osseous abnormalities. No evidence of musculoskeletal infection. Generalized chest wall edema. CT ABDOMEN PELVIS FINDINGS Hepatobiliary: Hypodense liver without focal abnormality. Small amount of high-density contents in the gallbladder may be sludge. No frank pericholecystic inflammation. There is no biliary dilatation. Pancreas: No ductal dilatation or inflammation. Spleen: Enlarged spleen spanning 13.6 cm cranial caudal. Heterogeneous parenchyma with multiple  splenic infarcts throughout the splenic parenchyma. An area of low-density in the upper spleen measuring 8.3 cm may be a large infarct or splenic abscess. There is no internal air. This is not significantly changed in size from prior. Adrenals/Urinary Tract: Normal adrenal glands. No hydronephrosis or perinephric edema. Homogeneous renal enhancement. Absent renal excretion on delayed phase imaging suggest underlying renal dysfunction. Urinary bladder is decompressed by Foley catheter. Stomach/Bowel: The stomach is decompressed by enteric tube. Occasional areas of small bowel wall thickening involving the jejunal bowel loops in the left abdomen. No pneumatosis. Fluid distends the cecum and ascending colon. There is wall thickening of the transverse colon. Formed stool in the more distal colon. 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 Mild adjacent soft tissue thickening. No active extravasation. Normal caliber abdominal aorta. Limited assessment for adenopathy with multiple prominent retroperitoneal lymph nodes. Reproductive: Hyperdense lesion in the right adnexa spanning 4.5 cm, unchanged from prior exam. This may represent a hemorrhagic cyst. There is no evidence of active extravasation. Uterus and left ovary appear normal. Other: Moderate volume of free fluid in the abdomen and pelvis, with only minimal increase over the past 2 days. Fluid is mildly complex with decreasing in density, may represent resolving hemoperitoneum. Generalized mesenteric and body wall edema, with confluent soft tissue edema in the flanks. No free air. No drainable fluid collection. Musculoskeletal: No CT findings of musculoskeletal infection. There are no  acute or suspicious osseous abnormalities. IMPRESSION: CT chest: 1. Moderately large bilateral pleural effusions with adjacent compressive atelectasis. Patchy and heterogeneous bilateral lung opacities in the aerated lungs, some  of which are nodular. Findings likely represent a combination of infection (including septic emboli) and pulmonary edema. 2. Aortic valve calcifications and ill-defined low-density likely vegetations. CT abdomen/pelvis: 1. Moderate volume of free fluid in the abdomen and pelvis, with only minimal increase over the past 2 days. This fluid is mildly complex with decreasing density from prior exams, may represent resolving hemoperitoneum. Etiology of this free fluid is indeterminate, may be from spleen, right adnexal lesion, or potentially SMA aneurysm. There is no evidence of active extravasation. 2. Splenomegaly with multiple splenic infarcts. Large area of low-density within the upper spleen may represent a large infarct or abscess. No internal air. 3. Developing areas of small bowel wall thickening involving the left abdomen which may be infective or ischemic. There is no pneumatosis or obstruction. 4. Wall thickening of the transverse colon which may be infectious or ischemic. Fluid distends the cecum and ascending colon. 5. 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 No active extravasation. 6. Hyperdense lesion in the right adnexa measuring 4.5 cm, unchanged from prior exams. This may represent a hemorrhagic cyst. 7. Absent renal excretion on delayed phase imaging suggest underlying renal dysfunction. Electronically Signed   By: Keith Rake M.D.   On: 07/09/2020 16:34   DG Chest Port 1 View  Result Date: 07/10/2020 CLINICAL DATA:  Hypoxia EXAM: PORTABLE CHEST 1 VIEW COMPARISON:  July 08, 2020 chest radiograph; chest CT July 09, 2020 FINDINGS: Endotracheal tube tip is 3.1 cm above the carina. Nasogastric tube tip and side port below the diaphragm. Central catheter tip is in the superior vena cava near the cavoatrial junction. No pneumothorax. Extensive airspace opacity throughout the lungs bilaterally persists with suspected underlying pleural effusions. Heart is mildly  enlarged with pulmonary vascularity normal. No adenopathy. No bone lesions. IMPRESSION: Tube and catheter positions as described without pneumothorax. Widespread airspace opacity persists bilaterally with suspected underlying pleural effusions. Stable cardiac prominence. Electronically Signed   By: Lowella Grip III M.D.   On: 07/10/2020 08:00   DG CHEST PORT 1 VIEW  Result Date: 07/14/2020 CLINICAL DATA:  Acute respiratory failure.  Intubation. EXAM: PORTABLE CHEST 1 VIEW COMPARISON:  One-view chest x-ray 07/04/2020 at 7 o'clock a.m. FINDINGS: Heart size is at right low lung volumes. Patient has been intubated. Endotracheal tube terminates 2 cm above the carina. Right IJ line is stable. Diffuse interstitial and airspace opacities are again noted bilaterally. Aeration is slightly improved from the prior study. IMPRESSION: 1. Interval intubation. Endotracheal tube terminates 2 cm above the carina. 2. Right IJ line is stable. 3. Low lung volumes and diffuse interstitial and airspace opacities bilaterally likely representing edema. Electronically Signed   By: San Morelle M.D.   On: 07/24/2020 15:40   DG Abd Portable 1V  Result Date: 07/09/2020 CLINICAL DATA:  OG tube placement EXAM: PORTABLE ABDOMEN - 1 VIEW COMPARISON:  CT 07/09/2020 FINDINGS: Esophageal tube tip overlies the mid gastric region. Faint excreted contrast at the kidneys with possible delayed nephrogram. Small right pleural effusion. Airspace disease at the medial left base. IMPRESSION: 1. Esophageal tube tip overlies the mid gastric region. 2. Small right pleural effusion. 3. Suspicion of delayed nephrogram, correlate with renal function test Electronically Signed   By: Donavan Foil M.D.   On: 07/09/2020 18:33   VAS [place] ABI  WITH/WO TBI  Result Date: 07/10/2020 LOWER EXTREMITY DOPPLER STUDY Indications: Mottled bilateral lower extremities, endocarditis with septic              emboli.  Comparison Study: No prior study  Performing Technologist: Hill, Jody RVT, RDMS  Examination Guidelines: A complete evaluation includes at minimum, Doppler waveform signals and systolic blood pressure reading at the level of bilateral brachial, anterior tibial, and posterior tibial arteries, when vessel segments are accessible. Bilateral testing is considered an integral part of a complete examination. Photoelectric Plethysmograph (PPG) waveforms and toe systolic pressure readings are included as required and additional duplex testing as needed. Limited examinations for reoccurring indications may be performed as noted.  ABI Findings: +---------+------------------+-----+-------------------+-----------------------+ Right    Rt Pressure (mmHg)IndexWaveform           Comment                 +---------+------------------+-----+-------------------+-----------------------+ Brachial                                           PICC line and bandaging +---------+------------------+-----+-------------------+-----------------------+ Popliteal                       triphasic                                  +---------+------------------+-----+-------------------+-----------------------+ PTA      27                0.28 dampened monophasic                        +---------+------------------+-----+-------------------+-----------------------+ DP                                                 Unable to insonate      +---------+------------------+-----+-------------------+-----------------------+ Great Toe                       Absent                                     +---------+------------------+-----+-------------------+-----------------------+ +---------+------------------+-----+-------------------+-------+ Left     Lt Pressure (mmHg)IndexWaveform           Comment +---------+------------------+-----+-------------------+-------+ Brachial 95                     triphasic                   +---------+------------------+-----+-------------------+-------+ Popliteal                       triphasic                  +---------+------------------+-----+-------------------+-------+ PTA      57                0.60 monophasic                 +---------+------------------+-----+-------------------+-------+ DP       46  0.48 dampened monophasic        +---------+------------------+-----+-------------------+-------+ Great Toe                       Absent                     +---------+------------------+-----+-------------------+-------+ +-------+-----------+-----------+------------+------------+ ABI/TBIToday's ABIToday's TBIPrevious ABIPrevious TBI +-------+-----------+-----------+------------+------------+ Right  0.28       0.00                                +-------+-----------+-----------+------------+------------+ Left   0.60       0.00                                +-------+-----------+-----------+------------+------------+  Summary: Right: Resting right ankle-brachial index indicates critical limb ischemia. 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  *See table(s) above for measurements and observations.     Preliminary    ECHOCARDIOGRAM COMPLETE  Result Date: 07/18/2020    ECHOCARDIOGRAM REPORT   Patient Name:   Terri Oneal Date of Exam: 07/04/2020 Medical Rec #:  [id number]       Height:       64.0 in Accession #:    [id number]      Weight:       119.7 lb Date of Birth:  09-Dec-1980        BSA:          1.573 m Patient Age:    49 years        BP:           113/54 mmHg Patient Gender: F               HR:           120 bpm. Exam Location:  Inpatient Procedure: 2D Echo, Cardiac Doppler and Color Doppler Indications:    Endocarditis I38  History:        Patient has no prior history of Echocardiogram examinations.                 Mitral Valve Prolapse.  Sonographer:    Bernadene Person RDCS  Referring Phys: [id number] [place]  1. Left ventricular ejection fraction, by estimation, is 60 to 65%. The left ventricle has normal function. The left ventricle has no regional wall motion abnormalities. Left ventricular diastolic function could not be evaluated.  2. Right ventricular systolic function is normal. The right ventricular size is normal.  3. The mitral valve is normal in structure. Mild mitral valve regurgitation. No evidence of mitral stenosis.  4. Tricuspid valve regurgitation is mild to moderate.     5. There is at least one mobile shaggy density on the ventricular side of the AV. The valve is not visualized well enough to discern which cusp it emanates from but possibly involves more than 1 cusp. There is also concern for perivalvular abcess with flow into a small echolucent space in the area of the right coronary cusp. There is at least moderate and possibly severe aortic regurgiation. This is consistent with endocarditis. Recommend TEE.  5. The inferior vena cava is normal in size with greater than 50% respiratory variability, suggesting right atrial pressure of 3 mmHg. FINDINGS  Left Ventricle: Left ventricular ejection fraction, by estimation, is 60 to 65%. The left ventricle has normal  function. The left ventricle has no regional wall motion abnormalities. The left ventricular internal cavity size was normal in size. There is  no left ventricular hypertrophy. Left ventricular diastolic function could not be evaluated. Right Ventricle: The right ventricular size is normal. No increase in right ventricular wall thickness. Right ventricular systolic function is normal. Left Atrium: Left atrial size was normal in size. Right Atrium: Right atrial size was normal in size. Pericardium: There is no evidence of pericardial effusion. Mitral Valve: The mitral valve is normal in structure. There is mild thickening of the mitral valve leaflet(s). Mild mitral valve regurgitation. No evidence  of mitral valve stenosis. Tricuspid Valve: The tricuspid valve is normal in structure. Tricuspid valve regurgitation is mild to moderate. No evidence of tricuspid stenosis. Aortic Valve: There is at least one mobile shaggy density on the ventricular side of the AV. The valve is not visualized well enough to discern which cusp it emanates from but possibly involves more than 1 cusp. There is also concern for perivalvular abcess with flow into a small echolucent space in the area of the right coronary cusp. There is at least moderate and possibly severe aortic regurgiation. This is consistent with endocarditis. Recommend TEE. The aortic valve is abnormal. Aortic valve regurgitation is moderate to severe. No aortic stenosis is present. Pulmonic Valve: The pulmonic valve was normal in structure. Pulmonic valve regurgitation is not visualized. No evidence of pulmonic stenosis. Aorta: The aortic root is normal in size and structure. Venous: The inferior vena cava is normal in size with greater than 50% respiratory variability, suggesting right atrial pressure of 3 mmHg. IAS/Shunts: No atrial level shunt detected by color flow Doppler.  LEFT VENTRICLE PLAX 2D LVIDd:         4.60 cm LVIDs:         2.80 cm LV PW:         0.80 cm LV IVS:        0.80 cm LVOT diam:     2.00 cm LV SV:         91 LV SV Index:   58 LVOT Area:     3.14 cm  RIGHT VENTRICLE TAPSE (M-mode): 2.1 cm LEFT ATRIUM             Index       RIGHT ATRIUM           Index LA diam:        3.40 cm 2.16 cm/m  RA Area:     11.30 cm LA Vol (A2C):   34.1 ml 21.68 ml/m RA Volume:   24.50 ml  15.58 ml/m LA Vol (A4C):   90.5 ml 57.53 ml/m LA Biplane Vol: 58.5 ml 37.19 ml/m  AORTIC VALVE LVOT Vmax:   181.00 cm/s LVOT Vmean:  114.000 cm/s LVOT VTI:    0.291 m  AORTA Ao Root diam: 3.20 cm Ao Asc diam:  3.60 cm  SHUNTS Systemic VTI:  0.29 m Systemic Diam: 2.00 cm Fransico Him MD Electronically signed by Fransico Him MD Signature Date/Time: 07/14/2020/4:45:48 PM     Final    ECHO TEE  Result Date: 07/09/2020    TRANSESOPHOGEAL ECHO REPORT   Patient Name:   Terri Oneal Date of Exam: 07/09/2020 Medical Rec #:  [id number]       Height:       64.0 in Accession #:    [id number]      Weight:       119.7 lb Date  of Birth:  Jul 05, 1981        BSA:          1.573 m Patient Age:    39 years        BP:           89/46 mmHg Patient Gender: F               HR:           104 bpm. Exam Location:  Inpatient Procedure: Transesophageal Echo, Color Doppler and Cardiac Doppler Indications:     Endocarditis I38; Bacteremia  History:         Patient has prior history of Echocardiogram examinations, most                  recent 07/03/2020. 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  Sonographer:     Mikki Santee RDCS (AE) Referring Phys:  [place] Diagnosing Phys: Mertie Moores MD PROCEDURE: After discussion of the risks and benefits of a TEE, an informed consent was obtained from a family member. The transesophogeal probe was passed without difficulty through the esophogus of the patient. Sedation performed by different physician. The patient was monitored while under deep sedation. Anesthestetic sedation was provided intravenously by Anesthesiology: 65m of Propofol. The patient developed no complications during the procedure. IMPRESSIONS  1. Left ventricular ejection fraction, by estimation, is 50 to 55%. The left ventricle has low normal function.  2. Right ventricular systolic function is normal. The right ventricular size is normal.  3. No left atrial/left atrial appendage thrombus was detected.  4. Large pleural effusion in the left lateral region.  5. The mitral valve is grossly normal. Moderate mitral valve regurgitation.  6. Aortic valve vegetation is visualized on the right.  7. The aortic valve is bicuspid. There is a large vegetation on the right coronary cusp. There is severe Aortic insufficiency.. The aortic valve is bicuspid. Aortic valve  regurgitation is severe. FINDINGS  Left Ventricle: Left ventricular ejection fraction, by estimation, is 50 to 55%. The left ventricle has low normal function. The left ventricular internal cavity size was normal in size. Right Ventricle: The right ventricular size is normal. No increase in right ventricular wall thickness. Right ventricular systolic function is normal. Left Atrium: Left atrial size was normal in size. No left atrial/left atrial appendage thrombus was detected. Right Atrium: Right atrial size was normal in size. Pericardium: There is no evidence of pericardial effusion. Mitral Valve: There is no reversal of flow in the pulmonary veins. The mitral valve is grossly normal. Moderate mitral valve regurgitation. Tricuspid Valve: The tricuspid valve is normal in structure. Tricuspid valve regurgitation is trivial. Aortic Valve: The aortic valve is bicuspid. There is a large vegetation on the right coronary cusp. There is severe Aortic insufficiency. The aortic valve is bicuspid. Aortic valve regurgitation is severe. A vegetation is seen on the right. Pulmonic Valve: The pulmonic valve was normal in structure. Pulmonic valve regurgitation is not visualized. Aorta: The aortic root is normal in size and structure. IAS/Shunts: The atrial septum is grossly normal. Additional Comments: There is a large pleural effusion in the left lateral region. PMertie MooresMD Electronically signed by PMertie MooresMD Signature Date/Time: 07/09/2020/4:35:41 PM    Final       Assessment/Plan:  INTERVAL HISTORY:   Patient on pressors and developing critical limb ischemia on  the right   Principal Problem:   Aortic valve endocarditis Active Problems:   Acute stroke due to occlusion of right middle cerebral artery (HCC)   Aortic valve abscess   Splenic infarct   Septic embolism (HCC)   IVDU (intravenous drug user)   Acute bacterial endocarditis    Terri Oneal is a 39 y.o. female with IV drug use,  history of prior endocarditis, admitted to the hospital and found to have left-sided endocarditis due to Enterococcus faecalis with septic emboli to the brain and spleen she had a massive right-sided MCA infarct with edema that required a decompressive hemicraniectomy.  Her 2D echocardiogram Oneal endocarditis of the aortic valve with concern for an aortic valve abscess  Continue high-dose ampicillin and ceftriaxone  She is developing critical limb ischemia  Been seen by cardiothoracic surgery and vascular surgery.  Prognosis appears grim  I am available for questions this weekend and Dr. Linus Salmons and Dr West Bali will take over the service on Monday.    LOS: 3 days   Alcide Evener 07/10/2020, 10:16 AM

## 2020-07-10 NOTE — Progress Notes (Signed)
Spoke with Both Mr. And Terri Oneal at bedside in the afternoon regarding their decision. Mr. And Terri Oneal states after multiple conversation with different providers, they understand her prognosis and would like to focus on ensuring that 'she does not suffer.' They are agreeable to proceeding with comfort care and 1-way extubation to comfort. All other questions and concerns addressed.

## 2020-07-10 NOTE — Care Plan (Addendum)
Had prolonged conversation with Ms.Terri Boehringer, [name]'s mother, regarding findings from repeat imaging and overnight events yesterday. Discussed evidence of multi-organ failure with multiple areas of infarct including her spleen, lungs, colon, small bowels and peripheral ischemia. Discussed inability to intervene surgically on her cardiac vegetation and [name]'s extremely poor prognosis. Discussed option of pursuing a more palliative approach with comfort care. [name] mentions that she would like time to discuss with her husband and will come to a decision. All other questions and concerns addressed.  Also spoke with [name]'s father at bedside once arrived. He was very distraught. Discussed in detail regarding the clinical course and likelihood of imminent demise. Again discussed option of transitioning to comfort care vs continuing current management. Mr.Terri Oneal mentions that they are awaiting their church pastor to arrive later this afternoon and they will come to a decision after further discussion.

## 2020-07-10 NOTE — Progress Notes (Signed)
Nutrition Brief Note  Chart reviewed. Pt discussed during ICU rounds and with RN. Consult received for TF. TF protocol initiated 12/16. Per discussion with MD after review of CT pt with multiorgan failure infarct of spleen, lungs, colon, small bowel, and critical limb ischemia. Will d/c TF. Family discussing transitioning to comfort care.  No further nutrition interventions planned at this time.  Please re-consult as needed.   Cammy Copa., RD, LDN, CNSC See AMi[facility] for contact information

## 2020-07-11 LAB — HCV RNA QUANT RFLX ULTRA OR GENOTYP
HCV RNA Qnt(log copy/mL): 6.111 log10 IU/mL
HepC Qn: 1290000 IU/mL

## 2020-07-11 LAB — HEPATITIS C GENOTYPE: Hepatitis C Genotype: 3

## 2020-07-11 LAB — LACTIC ACID, PLASMA: Lactic Acid, Venous: 2.9 mmol/L (ref 0.5–1.9)

## 2020-07-11 MED ORDER — ACETAMINOPHEN 650 MG RE SUPP: 650.0000 mg | Freq: Four times a day (QID) | RECTAL | Status: DC | PRN

## 2020-07-11 MED ORDER — MORPHINE SULFATE (PF) 2 MG/ML IV SOLN: 2.0000 mg | INTRAVENOUS | Status: DC | PRN

## 2020-07-11 MED ORDER — DEXTROSE 5 % IV SOLN: INTRAVENOUS | Status: DC

## 2020-07-11 MED ORDER — GLYCOPYRROLATE 1 MG PO TABS
1.0000 mg | ORAL_TABLET | ORAL | Status: DC | PRN
  Filled 2020-07-11: qty 1

## 2020-07-11 MED ORDER — MORPHINE BOLUS VIA INFUSION
5.0000 mg | INTRAVENOUS | Status: DC | PRN
  Administered 2020-07-11 (×4): 5 mg via INTRAVENOUS
  Filled 2020-07-11: qty 5

## 2020-07-11 MED ORDER — MORPHINE 100MG IN NS 100ML (1MG/ML) PREMIX INFUSION
0.0000 mg/h | INTRAVENOUS | Status: DC
  Administered 2020-07-11: 10:00:00 5 mg/h via INTRAVENOUS
  Administered 2020-07-11 – 2020-07-13 (×13): 30 mg/h via INTRAVENOUS
  Filled 2020-07-11 (×14): qty 100

## 2020-07-11 MED ORDER — LORAZEPAM 2 MG/ML IJ SOLN
2.0000 mg | INTRAMUSCULAR | Status: DC | PRN
  Administered 2020-07-11 (×2): 2 mg via INTRAVENOUS
  Filled 2020-07-11 (×3): qty 1

## 2020-07-11 MED ORDER — GLYCOPYRROLATE 0.2 MG/ML IJ SOLN
0.2000 mg | INTRAMUSCULAR | Status: DC | PRN
  Filled 2020-07-11: qty 1

## 2020-07-11 MED ORDER — GLYCOPYRROLATE 0.2 MG/ML IJ SOLN
0.2000 mg | INTRAMUSCULAR | Status: DC | PRN
  Administered 2020-07-11: 10:00:00 0.2 mg via INTRAVENOUS
  Filled 2020-07-11: qty 1

## 2020-07-11 MED ORDER — LORAZEPAM 2 MG/ML IJ SOLN
0.5000 mg/h | INTRAVENOUS | Status: DC
  Administered 2020-07-11: 13:00:00 2 mg/h via INTRAVENOUS
  Administered 2020-07-11: 20:00:00 5 mg/h via INTRAVENOUS
  Administered 2020-07-12: 14:00:00 7 mg/h via INTRAVENOUS
  Administered 2020-07-12: 04:00:00 5 mg/h via INTRAVENOUS
  Administered 2020-07-12 – 2020-07-13 (×3): 7 mg/h via INTRAVENOUS
  Filled 2020-07-11 (×6): qty 25
  Filled 2020-07-11: qty 50
  Filled 2020-07-11 (×4): qty 25

## 2020-07-11 MED ORDER — POLYVINYL ALCOHOL 1.4 % OP SOLN: 1.0000 [drp] | Freq: Four times a day (QID) | OPHTHALMIC | Status: DC | PRN

## 2020-07-11 MED ORDER — DIPHENHYDRAMINE HCL 50 MG/ML IJ SOLN: 25.0000 mg | INTRAMUSCULAR | Status: DC | PRN

## 2020-07-11 MED ORDER — ACETAMINOPHEN 325 MG PO TABS: 650.0000 mg | ORAL_TABLET | Freq: Four times a day (QID) | ORAL | Status: DC | PRN

## 2020-07-11 NOTE — Progress Notes (Signed)
Patient was compassionately extubated at 1151 per MD order/family wishes.

## 2020-07-11 NOTE — Progress Notes (Signed)
Patient's mother and father at bedside, patient transitioned to comfort measures at family request. PRN medications given prior to extubation and patient on continuous infusion for analgesia. Patient appears comfortable but is tachypneic in the 30-40s following extubation, MD notified and additional medication orders received.  Aris Lot, RN

## 2020-07-11 NOTE — Progress Notes (Signed)
Visited with patient and mother Harriett Sine). Father had gone down to eat.  Had prayer with them for peace for patient and parents.  They have had their pastor here too who has provided care.  Chaplain provided presence and prayer for strength.  07/11/20 1400  Clinical Encounter Type  Visited With Patient and family together  Visit Type Spiritual support  Referral From Physician  Consult/Referral To Chaplain  Spiritual Encounters  Spiritual Needs Prayer;Emotional  Phebe Colla, Chaplain

## 2020-07-11 NOTE — Progress Notes (Signed)
NAME:  Terri Oneal, MRN:  [id number], DOB:  [birth year], LOS: 4 ADMISSION DATE:  06/30/2020, CONSULTATION DATE:  08/03/20 REFERRING MD: Dr.Lindzen CHIEF COMPLAINT: left side weakness  Brief History   39 yo F w/ PMH of IVDU presenting with L hemiparesis and R gaze. Head CT showing large MCA infarct with midline shift  History of present illness   Terri Oneal is a 39 yo F w/ PMH of polysubstance abuse including IV heroin, prior endocarditis w/ mitral valve prolapse, h ypothyroidism who was  In her usual state of health until 9am yesterday. She was found down at home by her boyrfriend around 3pm in the afternoon with Left sided weakness. EMS was called ans she was brough to [facility] for evaluation. She was found to have rM1 occlusion with large MCA infarct on stat CT during code strokes. She was also found to have R paracloic gutter fluid and splenic infarct on CT abd/pelvis  PCCM consulted for ICU admission for further work up and management including hypertonic saline administration  Past Medical History  Polysubstance use Prior endocarditis w/ mitral valve prolapse Hypothyroidism Anxiety/depression  Significant Hospital Events   12/14 admit 12/15 resp distress, intubated 12/18 ongoing discussions about compassionate withdrawal of aggressive measures  Consults:  Neurology, Neurosurgery, Cardiology, Infectious disease  Procedures:  12/15 central line insertion 12/15 intubation 12/15 Right decompressive craniotomy 12/16 TEE  Significant Diagnostic Tests:   CT angio head and neck 12/14 > occlusion of the proximal right M1 and collaterals resulting in large right MCA infarct  CT abdomen pelvis 12/14 > CT abdomen and pelvis which revealed complex free fluid in the right paracolic gutter suspicious for hemoperitoneum as well as splenomegaly with likely several areas of splenic infarcts  Cerebral perfusion study 12/14 > 35 mL core infarct in the right MCA territory with 110 mL  surrounding ischemic penumbra.  Echocardiogram 12/15 > aortic valve vegetation  TEE 12/16 > Aortic valve vegetation, severe aortic insufficiency, moderate mitral regurg  CT chest/abd/plevis w/ contrast 12/16> stable free fluid in abd/pelvis, Splenomegaly, Small bowel wall and colon thickening concerning for infectious vs ischemic, SMA aneurysm  Micro Data:  12/14 COVID neg 12/14 Blood culture >> enterococcus faecalies  Antimicrobials:  12/14 vanc >> d/c 12/14 cefepime >> d/c 12/15 ampicillin >> 12/15 ceftriaxone >>  Interim history/subjective:  Remains on the ventilator Spontaneously moves right upper extremity No overnight events Minimally responsive  Objective   Blood pressure (!) 96/53, pulse 99, temperature 98.9 F (37.2 C), temperature source Oral, resp. rate (!) 30, height 5\' 4"  (1.626 m), weight 63.6 kg, SpO2 99 %.    Vent Mode: PRVC FiO2 (%):  [60 %] 60 % Set Rate:  [16 bmp-24 bmp] 24 bmp Vt Set:  [430 mL] 430 mL PEEP:  [8 cmH20] 8 cmH20 Plateau Pressure:  [23 cmH20-26 cmH20] 23 cmH20   Intake/Output Summary (Last 24 hours) at 07/11/2020 0850 Last data filed at 07/11/2020 0700 Gross per 24 hour  Intake 2158.98 ml  Output 650 ml  Net 1508.98 ml   Filed Weights   08-03-20 1000 07/10/20 0500  Weight: 54.3 kg 63.6 kg   Examination:  Gen: Young lady, ill appearing HEENT: Scleral icterus Neck: Supple CV: Tachycardic, S1-S2 appreciated Pulm: Decreased air entry bilaterally Abd: Distended, bowel sounds hypoactive Extm: Cold mottled lower extremities Skin: Areas of ecchymosis Neuro: [facility]   Resolved Hospital Problem list     Assessment & Plan:  Patient remains very sick The driver for multiorgan dysfunction unfortunately is  a bacteremia, 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 Outside tpa window. Not thrombectomy candidate. Neuro on board. S/p craniotomy w/ neurosurgery. Off hypertonic saline July 17, 2020. Based on area of infarct, prognosis for neurological recovery is poor - Appreciate neuro / neurosurgery recs - Aspiration/seizure precautions - Neurochecks  Complex free peritoneal fluid -Possible ovarian cyst rupture -Gut ischemia noted on CT scan  Polysubstance abuse Patient reported use of IV heroin 1 day prior to admission - Monitor for withdrawal sxs - Need to pursue cessation  Plan today is compassionate withdrawal of aggressive measures  Best practice (evaluated daily)   Diet: NPO Pain/Anxiety/Delirium protocol (if indicated): Compassionate withdrawal VAP protocol (if indicated): Y DVT prophylaxis: scd GI prophylaxis: PPI Glucose control: N/A Mobility: BR last date of multidisciplinary goals of care discussion 07/11/20 Code Status: DO NOT RESUSCITATE Disposition: ICU  Labs   CBC: Recent Labs  Lab 07/16/2020 2136 07/16/2020 2210 07/16/2020 2342 07-17-20 0757 Jul 17, 2020 1021 17-Jul-2020 2117 07/09/20 0309 07/09/20 0757 07/10/20 0510 07/10/20 0806 07/10/20 0823  WBC  19.9*  --  16.6* 20.7*  --  24.5* 18.5*  --  21.0*  --   --   NEUTROABS 17.7*  --   --   --   --   --   --   --  15.2*  --   --   HGB 8.0*   < > 8.6* 6.5*   < > 8.7* 8.4* 8.2* 8.6* 8.8*  --   HCT 26.9*   < > 28.6* 21.1*   < > 28.6* 25.8* 24.0* 29.3* 26.0*  --   MCV 95.4  --  94.1 92.5  --  94.4 91.5  --  99.3  --   --   PLT 103*  --  83* 113*  --  115* 108*  --  31*  --  30*   < > = values in this interval not displayed.    Basic Metabolic Panel: Recent Labs  Lab 07/11/2020 2136 06/27/2020 2210 06/25/2020 2222 07/04/2020 2342 Jul 17, 2020 0757 2020/07/17 1021 07-17-2020 1648 July 17, 2020 2117 07/09/20 0309 07/09/20 0757 07/09/20 1159 07/10/20 0510 07/10/20 0806  NA 132* 132*   < >  --  138   < > 144   < > 146* 147* 145 149* 150*  K 4.1 4.1  --   --  3.8   < > 3.7  --  3.7 3.5  --  4.3 4.1  CL 99 100  --   --  106  --   --   --  116*  --   --  120*  --   CO2 20*  --   --   --  21*  --   --   --  19*  --   --  10*  --  GLUCOSE 111* 108*  --   --  93  --   --   --  94  --   --  91  --   BUN 20 22*  --   --  19  --   --   --  20  --   --  31*  --   CREATININE 0.97 0.80  --  0.75 0.77  --   --   --  0.85  --   --  1.40*  --   CALCIUM 7.9*  --   --   --  7.5*  --   --   --  7.6*  --   --  7.5*  --   MG  --   --   --   --   --   --   --   --  2.1  --   --  2.2  --   PHOS  --   --   --   --   --   --   --   --  3.9  --   --  5.8*  --    < > = values in this interval not displayed.   GFR: Estimated Creatinine Clearance: 45.7 mL/min (A) (by C-G formula based on SCr of 1.4 mg/dL (H)). Recent Labs  Lab 07-15-20 2239 Jul 15, 2020 2342 07/17/2020 0757 07/23/2020 2117 07/09/20 0309 07/10/20 0510 07/10/20 0823  WBC  --    < > 20.7* 24.5* 18.5* 21.0*  --   LATICACIDVEN 3.2*  --  1.6  --   --   --  5.6*   < > = values in this interval not displayed.    Liver Function Tests: Recent Labs  Lab 2020/07/15 2136 07/09/20 0309 07/10/20 0510  AST 42* 34 92*  ALT 18 15 16   ALKPHOS 157* 102 90  BILITOT 3.5*  7.6* 9.5*  PROT 6.2* 4.7* 5.0*  ALBUMIN 1.9* 1.7* 1.8*   No results for input(s): LIPASE, AMYLASE in the last 168 hours. No results for input(s): AMMONIA in the last 168 hours.  ABG    Component Value Date/Time   PHART 7.349 (L) 07/10/2020 0806   PCO2ART 21.2 (L) 07/10/2020 0806   PO2ART 121 (H) 07/10/2020 0806   HCO3 11.5 (L) 07/10/2020 0806   TCO2 12 (L) 07/10/2020 0806   ACIDBASEDEF 12.0 (H) 07/10/2020 0806   O2SAT 98.0 07/10/2020 0806     Coagulation Profile: Recent Labs  Lab Jul 15, 2020 2136 07/10/2020 1137 07/10/20 0823  INR 1.4* 1.5* 2.3*    Cardiac Enzymes: Recent Labs  Lab 07/10/20 0823  CKTOTAL 36*    HbA1C: Hgb A1c MFr Bld  Date/Time Value Ref Range Status  07/12/2020 07:57 AM 4.5 (L) 4.8 - 5.6 % Final    Comment:    (NOTE) Pre diabetes:          5.7%-6.4%  Diabetes:              >6.4%  Glycemic control for   <7.0% adults with diabetes     CBG: Recent Labs  Lab 07/09/20 2314 07/10/20 0407 07/10/20 0753 07/10/20 0803 07/10/20 1553  GLUCAP 58* 91 44* 105* 122*    Review of Systems:   Unable to assess  Past Medical History  She,  has a past medical history of Anxiety and depression, 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   Surgical History    Past Surgical History:  Procedure Laterality Date  . CRANIOTOMY N/A 07/22/2020   Procedure: RIGHT DECOMPRESSIVE HEMI CRANIECTOMY;  Surgeon: Lisbeth RenshawNundkumar, Neelesh, MD;  Location: [facility] OR;  Service: Neurosurgery;  Laterality: N/A;  . KNEE ARTHROPLASTY    . KNEE SURGERY    . MOUTH SURGERY       Social History   reports that she has been smoking cigarettes. She has been smoking about 1.00 pack per day. She has never used smokeless tobacco. She reports current alcohol use. She reports  current drug use. Drugs: Heroin and IV.   Family History   Her family history includes Diabetes in an other family member; Hypertension in an other family member; Thyroid disease in an other family member.   Allergies No Known Allergies   Home Medications  Prior to Admission medications   Medication Sig Start Date End Date Taking? Authorizing Provider  ibuprofen (ADVIL) 200 MG tablet Take 600-800 mg by mouth every 6 (six) hours as needed for headache or mild pain.   Yes [provider]    The patient is critically ill with multiple organ systems failure and requires high complexity decision making for assessment and support, frequent evaluation and titration of therapies, application of advanced monitoring technologies and extensive interpretation of multiple databases. Critical Care Time devoted to patient care services described in this note independent of APP/resident time (if applicable)  is 35 minutes.   Virl DiamondAdewale Schwanda Zima MD [facility] Personal pager: [phone] If unanswered, please page CCM On-call: #[phone]

## 2020-07-12 NOTE — Progress Notes (Signed)
NAME:  Terri Oneal, MRN:  [id number], DOB:  1980-12-30, LOS: 5 ADMISSION DATE:  07/01/2020, CONSULTATION DATE:  07/09/2020 REFERRING MD: Dr.Lindzen CHIEF COMPLAINT: left side weakness  Brief History   39 yo F w/ PMH of IVDU presenting with L hemiparesis and R gaze. Head CT showing large MCA infarct with midline shift Patient made full comfort measures 07/11/2020, compassionate extubation  History of present illness   Terri Oneal is a 39 yo F w/ PMH of polysubstance abuse including IV heroin, prior endocarditis w/ mitral valve prolapse, h ypothyroidism who was  In her usual state of health until 9am yesterday. She was found down at home by her boyrfriend around 3pm in the afternoon with Left sided weakness. EMS was called ans she was brough to [facility] for evaluation. She was found to have rM1 occlusion with large MCA infarct on stat CT during code strokes. She was also found to have R paracloic gutter fluid and splenic infarct on CT abd/pelvis  PCCM consulted for ICU admission for further work up and management including hypertonic saline administration  Past Medical History  Polysubstance use Prior endocarditis w/ mitral valve prolapse Hypothyroidism Anxiety/depression  Significant Hospital Events   12/14 admit 12/15 resp distress, intubated 12/18 ongoing discussions about compassionate withdrawal of aggressive measures 12/18 full comfort measures  Consults:  Neurology, Neurosurgery, Cardiology, Infectious disease  Procedures:  12/15 central line insertion 12/15 intubation 12/15 Right decompressive craniotomy 12/16 TEE  Significant Diagnostic Tests:   CT angio head and neck 12/14 > occlusion of the proximal right M1 and collaterals resulting in large right MCA infarct  CT abdomen pelvis 12/14 > CT abdomen and pelvis which revealed complex free fluid in the right paracolic gutter suspicious for hemoperitoneum as well as splenomegaly with likely several areas of splenic  infarcts  Cerebral perfusion study 12/14 > 35 mL core infarct in the right MCA territory with 110 mL surrounding ischemic penumbra.  Echocardiogram 12/15 > aortic valve vegetation  TEE 12/16 > Aortic valve vegetation, severe aortic insufficiency, moderate mitral regurg  CT chest/abd/plevis w/ contrast 12/16> stable free fluid in abd/pelvis, Splenomegaly, Small bowel wall and colon thickening concerning for infectious vs ischemic, SMA aneurysm  Micro Data:  12/14 COVID neg 12/14 Blood culture >> enterococcus faecalies  Antimicrobials:  12/14 vanc >> d/c 12/14 cefepime >> d/c 12/15 ampicillin 12/18 12/15 ceftriaxone 12/18  Interim history/subjective:  Patient on full comfort measures  Objective   Blood pressure (!) 94/45, pulse (!) 135, temperature 99.2 F (37.3 C), temperature source Axillary, resp. rate (!) 37, height 5\' 4"  (1.626 m), weight 63.6 kg, SpO2 (!) 65 %.        Intake/Output Summary (Last 24 hours) at 07/12/2020 1021 Last data filed at 07/12/2020 0900 Gross per 24 hour  Intake 1231.98 ml  Output 825 ml  Net 406.98 ml   Filed Weights   07/18/2020 1000 07/10/20 0500  Weight: 54.3 kg 63.6 kg   Examination:  On full comfort measures Family at bedside mother and father  Tachypneic, does appear comfortable  On morphine drip and Ativan drip  Resolved Hospital Problem list     Assessment & Plan:   Patient on full comfort measures  The driver for multiorgan dysfunction unfortunately is a bacteremia, infective endocarditis which has already led to a massive stroke, contributing to critical limb ischemia, contributing to gut ischemia, contributing to bowel ischemia, with disseminated intravascular coagulopathy -Not a surgical candidate -Process is nonsurvivable -Was made full comfort measures on 07/11/2020  We will continue to focus on patient's comfort Continue medications to achieve patient's comfort  Virl Diamond, MD [facility] Pager:  [phone]

## 2020-07-12 NOTE — Progress Notes (Signed)
Wasted 40 mL of ativan drip, witnessed by [employer]. New bag sent from pharmacy with correct dosage was replaced.

## 2020-07-13 LAB — GLUCOSE, CAPILLARY: Glucose-Capillary: 33 mg/dL — CL (ref 70–99)

## 2020-07-14 LAB — CULTURE, BLOOD (ROUTINE X 2)
Culture: NO GROWTH
Culture: NO GROWTH
Special Requests: ADEQUATE
Special Requests: ADEQUATE

## 2020-07-25 NOTE — Progress Notes (Signed)
Wasted 100cc Ativan gtt and 75cc Morphine gtt in waste receptacle, witnessed by Lynnette Caffey.

## 2020-07-25 NOTE — Death Summary Note (Signed)
DEATH SUMMARY   Patient Details  Name: Terri Oneal MRN: [id number] DOB: 03-Sep-1980  Admission/Discharge Information   Admit Date:  Jul 17, 2020  Date of Death: Date of Death: 07/23/20  Time of Death: Time of Death: 0739  Length of Stay: 6  Referring Physician: Patient, No Pcp Per   Reason(s) for Hospitalization  Presented to the hospital 07-17-2020 as a code stroke with left hemiparesis, right sided gaze  Diagnoses  Preliminary cause of death: Infective endocarditis Secondary Diagnoses (including complications and co-morbidities):  Principal Problem:   Aortic valve endocarditis Active Problems:   Acute stroke due to occlusion of right middle cerebral artery (HCC)   Aortic valve abscess   Splenic infarct   Septic embolism (HCC)   IVDU (intravenous drug user)   Acute bacterial endocarditis Acute renal failure  Brief Hospital Course (including significant findings, care, treatment, and services provided and events leading to death)  Terri Oneal is a 40 y.o. year old female who admitted 07-17-20, presented to the hospital as a code stroke with left hemiparesis and right gaze.  Last known well was 0900 on day of presentation.  Found by boyfriend with left-sided weakness, initial evaluation did reveal a large MCA infarct.  Was not a TPA candidate, was not a thrombectomy candidate.  Was septic on admission with tachypnea, tachycardia, leukocytosis and lactic acidosis.  Was admitted to the ICU for further management. CT abdomen did reveal a splenic infarct, right paracolic gutter collection suspicious for hemoperitoneum.  Patient was started on antibiotics. 12/15-emergently intubated, was taken to the operating room for decompressive hemicraniectomy for intracranial hypertension Echocardiogram on 12/15 did show aortic valve vegetation.  Cardiothoracic surgery was consulted for concern for aortic valve abscess-astronomical risk for surgical intervention. 12/17 worsening with lab  details consistent with DIC, noted to have cold clammy lower extremities with ultrasound consistent with critical limb ischemia.  Follow-up CT abdomen revealing findings suggestive of gut ischemia Ongoing goals of care discussions with the family members, patient's mother and father.  Decision was made to make patient compassionate withdrawal of aggressive care and comfort measures only 12/18 was transitioned to comfort measures only on 12/18 Patient succumbed to her illness on 07/23/2020 at 0739 hrs. Pertinent Labs and Studies  Significant Diagnostic Studies CT ABDOMEN PELVIS WO CONTRAST  Result Date: July 18, 2020 CLINICAL DATA:  Follow-up hemoperitoneum EXAM: CT ABDOMEN AND PELVIS WITHOUT CONTRAST TECHNIQUE: Multidetector CT imaging of the abdomen and pelvis was performed following the standard protocol without IV contrast. COMPARISON:  07-17-20 FINDINGS: Lower chest: Small right pleural effusion, similar to slightly progressed from yesterday's study. Increasing trace left pleural effusion. Dependent bibasilar atelectasis and patchy ground-glass opacities within the visualized lung bases. Hepatobiliary: Unremarkable unenhanced appearance of the liver. No focal liver lesion identified. Excreted contrast within the gallbladder. Gallbladder is otherwise within normal limits. No hyperdense gallstone. No biliary dilatation. Pancreas: Grossly unremarkable Spleen: Splenomegaly. Large somewhat ill-defined area of fluid density within the upper pole of the spleen measuring up to 9.1 cm is similar in size and appearance to the previous study, remeasured. Additional areas of low density within the mid and inferior portions of the spleen with a more wedge-shaped configuration are similar to the prior study and may reflect splenic infarcts. Adrenals/Urinary Tract: Unremarkable adrenal glands. Small amount of excreted contrast within the kidneys and ureters. Kidneys are normal in size without focal lesion, stone, or  hydronephrosis. Excreted contrast within the urinary bladder. Stomach/Bowel: Stomach and bowel are within normal limits. No dilated loops of bowel.  No inflammatory changes are evident. Vascular/Lymphatic: No significant vascular findings evident on non contrasted exam. Evaluation of adenopathy is somewhat limited in the absence of intravenous contrast and relative paucity of intra-abdominal fat. No definite abdominopelvic lymphadenopathy. Reproductive: Uterus is grossly unremarkable. Redemonstration of a ovoid somewhat hyperdense structure within the right adnexal region measuring 5.0 x 3.1 cm (series 3, image 74), possibly large hyperdense adnexal cyst. Other: Small to moderate volume hyperdense fluid is again seen predominantly within the right pericolic gutter and pelvis, similar in volume to the prior study. No pneumoperitoneum. Musculoskeletal: Mild anasarca. No acute osseous finding. No suspicious bone lesion. No significant degenerative findings. IMPRESSION: 1. Small-to-moderate volume hyperdense fluid predominantly within the right pericolic gutter and pelvis suggestive of hemoperitoneum, similar in volume to the prior study. Etiology remains uncertain on non-contrasted exam. 2. No significant interval change in size and appearance of large somewhat ill-defined area of fluid density within the upper pole of the spleen measuring up to 9.1 cm, which may reflect splenic infarct, cyst, or possibly abscess. Additional areas of low density within the mid and inferior portions of the spleen with a more wedge-shaped configuration are similar to the prior study and are more likely to reflect splenic infarcts. 3. Redemonstration of a ovoid hyperdense structure within the right adnexal region measuring 5.0 x 3.1 cm, possibly large hyperdense adnexal cystic lesion. Recommend pelvic ultrasound for further evaluation. 4. Small right pleural effusion, similar to slightly progressed from yesterday's study. Increasing trace  left pleural effusion. Dependent bibasilar atelectasis and patchy ground-glass opacities within the visualized lung bases. Electronically Signed   By: Duanne Guess D.O.   On: 07-23-2020 12:38   CT ABDOMEN PELVIS WO CONTRAST  Result Date: 06/28/2020 CLINICAL DATA:  Leukocytosis and abdominal pain. Stroke. EXAM: CT ABDOMEN AND PELVIS WITHOUT CONTRAST TECHNIQUE: Multidetector CT imaging of the abdomen and pelvis was performed following the standard protocol without IV contrast. COMPARISON:  None. FINDINGS: Lower chest: Right pleural effusion. There is basilar septal thickening. Patchy and ground-glass opacities in both lung bases, also confluent dependently. Hepatobiliary: No evidence of focal hepatic lesion, evaluation limited by lack of IV contrast. There are hyperdense blood products in the right pericolic gutter abutting the liver tip. Gallbladder appears present, no calcified gallstone. Pancreas: Grossly negative. Spleen: Enlarged spanning 13 x 7.3 x 12.7 cm. Multiple areas of low-density within the spleen, including dominant cystic structure superiorly measuring 8.3 cm. There are also wedge-shaped and peripheral defects inferiorly. There may be trace perisplenic fluid. Adrenals/Urinary Tract: No adrenal nodule. There is excreted IV contrast within both renal collecting systems from prior neck CTA. Nephrograms are slightly striated. There is excreted IV contrast in the urinary bladder. Stomach/Bowel: Bowel evaluation is limited in the absence of contrast, paucity of intra-abdominal fat, generalized edema of abdominal fat. Stomach is unremarkable. There is no evidence of obstruction. Appendix is not readily identified. Small volume of stool in the proximal colon. Distal colon is decompressed. Vascular/Lymphatic: Normal caliber abdominal aorta. Limited assessment for adenopathy on the current exam. Reproductive: The uterus appears present. There is suggestion of an ovoid high density structure in the right  adnexa spanning 5.1 cm, pelvic free fluid appears complex. Streak artifact from dense IV contrast in the bladder obscures detailed assessment. Other: There is generalized edema of the subcutaneous and intra-abdominal fat. Complex free fluid in the right pericolic gutter, as well as in the pelvis. Lesser complex free fluid in the mesentery and possibly left pericolic gutter. Etiology is uncertain. No free air.  Musculoskeletal: There are no acute or suspicious osseous abnormalities. IMPRESSION: 1. Complex free fluid in the right pericolic gutter, as well as in the pelvis, suspicious for hemoperitoneum. Etiology is uncertain, however there is suggestion of an ovoid high density structure in the right adnexa spanning 5.1 cm, that may represent a hemorrhagic cyst. 2. Splenomegaly. Multiple areas of low-density within the spleen suspicious for infarcts. There is a dominant cystic structure superiorly spanning 8.3 cm, indeterminate for additional infarct, cyst, or abscess. 3. Right pleural effusion. Patchy and ground-glass opacities in both lung bases, also confluent dependently. Findings may be secondary to pulmonary edema, pneumonia, or combination thereof. 4. Generalized edema of the subcutaneous and intra-abdominal fat. 5. Contrast within both renal collecting systems from prior neck CTA. There is suggestion of mild striated nephrograms, which can be seen with urinary tract infection. 6. Patient may benefit from contrast enhanced exam to further delineate. If she is hemodynamically stable, recommend trending of renal function, and follow-up CT could be performed in the next day. These results were called by telephone at the time of interpretation on 06/25/2020 at 11:28 pm to provider CLAUDIA GIBBONS , who verbally acknowledged these results. Electronically Signed   By: Narda Rutherford M.D.   On: 07/21/2020 23:28   CT Code Stroke CTA Head W/WO contrast  Result Date: 07/12/2020 CLINICAL DATA:  Left facial droop.   Right MCA infarct. EXAM: CT ANGIOGRAPHY HEAD AND NECK TECHNIQUE: Multidetector CT imaging of the head and neck was performed using the standard protocol during bolus administration of intravenous contrast. Multiplanar CT image reconstructions and MIPs were obtained to evaluate the vascular anatomy. Carotid stenosis measurements (when applicable) are obtained utilizing NASCET criteria, using the distal internal carotid diameter as the denominator. CONTRAST:  75mL OMNIPAQUE IOHEXOL 350 MG/ML SOLN COMPARISON:  None. FINDINGS: CTA NECK FINDINGS SKELETON: There is no bony spinal canal stenosis. No lytic or blastic lesion. OTHER NECK: Normal pharynx, larynx and major salivary glands. No cervical lymphadenopathy. Unremarkable thyroid gland. UPPER CHEST: Small pleural effusions with areas of right apical consolidation. AORTIC ARCH: There is no calcific atherosclerosis of the aortic arch. There is no aneurysm, dissection or hemodynamically significant stenosis of the visualized portion of the aorta. Conventional 3 vessel aortic branching pattern. The visualized proximal subclavian arteries are widely patent. RIGHT CAROTID SYSTEM: Normal without aneurysm, dissection or stenosis. LEFT CAROTID SYSTEM: Normal without aneurysm, dissection or stenosis. VERTEBRAL ARTERIES: Left dominant configuration. Both origins are clearly patent. There is no dissection, occlusion or flow-limiting stenosis to the skull base (V1-V3 segments). CTA HEAD FINDINGS POSTERIOR CIRCULATION: --Vertebral arteries: Normal V4 segments. --Inferior cerebellar arteries: Normal. --Basilar artery: Normal. --Superior cerebellar arteries: Normal. --Posterior cerebral arteries (PCA): Normal. ANTERIOR CIRCULATION: --Intracranial internal carotid arteries: Normal. --Anterior cerebral arteries (ACA): Normal. Both A1 segments are present. Patent anterior communicating artery (a-comm). --Middle cerebral arteries (MCA): Proximal right M1 segment is occluded. The more  distal M1 segment opacifies normally. There is limited collateralization within the anterior right MCA territory. M2 branches are otherwise predominantly patent. VENOUS SINUSES: As permitted by contrast timing, patent. ANATOMIC VARIANTS: Both P comms are present. Review of the MIP images confirms the above findings. IMPRESSION: 1. Occlusion of the proximal right M1 segment with limited collateralization within the anterior right MCA territory. 2. Large right MCA territory infarct with previously reported ASPECTS of 0. 3. Small pleural effusions with areas of right apical consolidation. These results were communicated to Dr. Caryl Pina at 10:42 pm on 07/15/2020 by text page via the [facility]  messaging system. Electronically Signed   By: Deatra Robinson M.D.   On: 06/28/2020 22:44   CT HEAD WO CONTRAST  Result Date: 07/10/2020 CLINICAL DATA:  Intravenous drug use, endocarditis, stroke. Follow-up examination. EXAM: CT HEAD WITHOUT CONTRAST TECHNIQUE: Contiguous axial images were obtained from the base of the skull through the vertex without intravenous contrast. COMPARISON:  07/09/2020, July 16, 2020 FINDINGS: Brain: Right frontotemporal craniectomy and decompression is again identified. Large right subacute MCA territory infarct is again identified with loss of gray-white matter differentiation, sulcal effacement, and mild mass effect leading to effacement of the right lateral ventricle and approximately 4-5 mm right to left midline shift, improved since prior examination. Mild subarachnoid hemorrhage again noted subjacent to the craniectomy defect. Hyperdense right MCA/M1 again identified. Involving infarct involving the right basal ganglia and caudate nucleus. No interval hemorrhage. No interval acute infarct. Ventricular size is normal. Cerebellum is unchanged with a tiny remote infarct within the right cerebellar hemisphere. Vascular: Right hyperdense MCA again identified. Skull: Right craniectomy defect again  identified. Sinuses/Orbits: The visualized paranasal sinuses are clear. Orbits are unremarkable. Other: Mastoid air cells and middle ear cavities are clear. IMPRESSION: Stable examination with evolving large right MCA territory infarct status post decompression. Minimal subarachnoid hemorrhage overlying the right cerebral hemisphere subjacent to the craniectomy defect. Mild mass effect with improving midline shift, now 4-5 mm. No interval hemorrhage or infarct. Electronically Signed   By: Helyn Numbers MD   On: 07/10/2020 01:41   CT HEAD WO CONTRAST  Result Date: 07/09/2020 CLINICAL DATA:  Stroke follow-up. EXAM: CT HEAD WITHOUT CONTRAST TECHNIQUE: Contiguous axial images were obtained from the base of the skull through the vertex without intravenous contrast. COMPARISON:  Head CT 2020-07-16. FINDINGS: Brain: Evolving large infarct involving most of the right MCA territory status post decompression with loss of the gray-white differentiation, effacement of the corresponding an adjacent cerebral sulci and mass effect on the right lateral ventricle. No hydrocephalus, entrapment or significant midline shift. No hemorrhagic transformation. Vascular: Unchanged hyperdense right carotid terminus and right M1/MCA with vascular calcification in the right ICA terminus and in the right sylvian fissure, likely within and M2 branch. Skull: Postsurgical changes from right craniectomy without evidence of complication. Sinuses/Orbits: Mild mucosal thickening of the left sphenoid sinus. The orbits are maintained. 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 No hemorrhagic transformation or midline shift. Electronically Signed   By: Baldemar Lenis M.D.   On: 07/09/2020 16:23   CT HEAD WO CONTRAST  Result Date: 2020-07-16 CLINICAL DATA:  Stroke, follow-up EXAM: CT HEAD WITHOUT CONTRAST TECHNIQUE: Contiguous axial images were obtained from the base of the skull  through the vertex without intravenous contrast. COMPARISON:  Earlier same day FINDINGS: Brain: Evolving right MCA territory infarction is again identified with hypoattenuation and loss of gray-white differentiation. Relative sparing of the right basal ganglia and some cortex could reflect areas of petechial hemorrhage. However, there is no discrete hematoma. No midline shift or hydrocephalus. Vascular: Persistent hyperdensity along the right M1 MCA and focal calcification probably along a right M2 MCA branch in the sylvian fissure. Skull: Calvarium is unremarkable. Sinuses/Orbits: No acute finding. Other: None. IMPRESSION: Evolving large right MCA territory infarction with possible petechial hemorrhage. No discrete hematoma. No significant mass effect at this time. Electronically Signed   By: Guadlupe Spanish M.D.   On: Jul 16, 2020 10:18   CT HEAD WO CONTRAST  Result Date: 2020-07-16 CLINICAL DATA:  40 year old female code stroke presentation,  right MCA E LV 0. EXAM: CT HEAD WITHOUT CONTRAST TECHNIQUE: Contiguous axial images were obtained from the base of the skull through the vertex without intravenous contrast. COMPARISON:  CT head, CTA and CTP yesterday. FINDINGS: Brain: Confluent vasogenic edema in much of the right MCA territory as before. Apparent sparing of the right caudate and lentiform might reflect petechial hemorrhage in those areas. Along probable 2nd the right posterior MCA area of petechial hemorrhage/PCA watershed on series 4, image 23, stable. No malignant hemorrhagic transformation. Stable extent of cytotoxic edema since 20/1 38 hours yesterday. Mass effect with trace leftward midline shift (coronal image 34) has not significantly changed. Stable mild mass effect on the ventricles with no ventriculomegaly. Basilar cisterns remain patent. Gray-white matter differentiation outside of the right MCA territory is stable and within normal limits. Vascular: Abnormal increased density and heterogeneity  of the right M1, with small calcifications in right MCA sylvian fissure branches which is suspicious for calcified thromboembolic disease. Skull: Stable, intact. Sinuses/Orbits: Stable small fluid level in the right sphenoid. Other Visualized paranasal sinuses and mastoids are stable and well pneumatized. Other: No acute orbit or scalp soft tissue finding. IMPRESSION: 1. Extensive Right MCA territory infarct with cytotoxic edema and mild intracranial mass effect not significantly changed from 2138 hours yesterday. Possible petechial hemorrhage, but no malignant hemorrhagic transformation. 2. Stable trace leftward midline shift. Basilar cisterns remain patent. 3. Other vascular territories remain within normal limits. These results were communicated to Dr. Otelia Limes at 4:28 am on 06/30/2020 by text page via the [facility] messaging system. Electronically Signed   By: Odessa Fleming M.D.   On: 07/18/2020 04:28   CT Code Stroke CTA Neck W/WO contrast  Result Date: 07/20/2020 CLINICAL DATA:  Left facial droop.  Right MCA infarct. EXAM: CT ANGIOGRAPHY HEAD AND NECK TECHNIQUE: Multidetector CT imaging of the head and neck was performed using the standard protocol during bolus administration of intravenous contrast. Multiplanar CT image reconstructions and MIPs were obtained to evaluate the vascular anatomy. Carotid stenosis measurements (when applicable) are obtained utilizing NASCET criteria, using the distal internal carotid diameter as the denominator. CONTRAST:  75mL OMNIPAQUE IOHEXOL 350 MG/ML SOLN COMPARISON:  None. FINDINGS: CTA NECK FINDINGS SKELETON: There is no bony spinal canal stenosis. No lytic or blastic lesion. OTHER NECK: Normal pharynx, larynx and major salivary glands. No cervical lymphadenopathy. Unremarkable thyroid gland. UPPER CHEST: Small pleural effusions with areas of right apical consolidation. AORTIC ARCH: There is no calcific atherosclerosis of the aortic arch. There is no aneurysm, dissection or  hemodynamically significant stenosis of the visualized portion of the aorta. Conventional 3 vessel aortic branching pattern. The visualized proximal subclavian arteries are widely patent. RIGHT CAROTID SYSTEM: Normal without aneurysm, dissection or stenosis. LEFT CAROTID SYSTEM: Normal without aneurysm, dissection or stenosis. VERTEBRAL ARTERIES: Left dominant configuration. Both origins are clearly patent. There is no dissection, occlusion or flow-limiting stenosis to the skull base (V1-V3 segments). CTA HEAD FINDINGS POSTERIOR CIRCULATION: --Vertebral arteries: Normal V4 segments. --Inferior cerebellar arteries: Normal. --Basilar artery: Normal. --Superior cerebellar arteries: Normal. --Posterior cerebral arteries (PCA): Normal. ANTERIOR CIRCULATION: --Intracranial internal carotid arteries: Normal. --Anterior cerebral arteries (ACA): Normal. Both A1 segments are present. Patent anterior communicating artery (a-comm). --Middle cerebral arteries (MCA): Proximal right M1 segment is occluded. The more distal M1 segment opacifies normally. There is limited collateralization within the anterior right MCA territory. M2 branches are otherwise predominantly patent. VENOUS SINUSES: As permitted by contrast timing, patent. ANATOMIC VARIANTS: Both P comms are present. Review of the  MIP images confirms the above findings. IMPRESSION: 1. Occlusion of the proximal right M1 segment with limited collateralization within the anterior right MCA territory. 2. Large right MCA territory infarct with previously reported ASPECTS of 0. 3. Small pleural effusions with areas of right apical consolidation. These results were communicated to Dr. Caryl Pina at 10:42 pm on 07/12/2020 by text page via the [facility] messaging system. Electronically Signed   By: Deatra Robinson M.D.   On: 07/05/2020 22:44   CT CHEST ABDOMEN PELVIS W CONTRAST  Result Date: 07/09/2020 CLINICAL DATA:  Increased abdominal girth. Abdominal distension with significant  increasing growth and tenderness to palpation this morning. EXAM: CT CHEST, ABDOMEN, AND PELVIS WITH CONTRAST TECHNIQUE: Multidetector CT imaging of the chest, abdomen and pelvis was performed following the standard protocol during bolus administration of intravenous contrast. CONTRAST:  OMNIPAQUE IOHEXOL 300 MG/ML  SOLN COMPARISON:  Recent chest radiographs FINDINGS: CT CHEST FINDINGS Cardiovascular: Aortic valve calcifications as well as ill-defined low-density likely vegetations. No aortic dissection. There are no filling defects in the pulmonary arteries to suggest pulmonary embolus on this non dedicated CTA exam. Heart is normal in size. No significant pericardial effusion. Right internal jugular central line in place, tip obscured by dense IV contrast Mediastinum/Nodes: Assessment for hilar adenopathy is limited due to adjacent pleural fluid and atelectasis. Small mediastinal lymph nodes are not enlarged by size criteria. Endotracheal tube tip above the carina. Enteric tube decompresses the esophagus. Lungs/Pleura: Moderately large bilateral pleural effusions. Adjacent compressive atelectasis with near complete atelectasis of the left lower lobe. Patchy and heterogeneous bilateral lung opacities in the aerated lungs, some of which are nodular. Occasional areas of septal thickening. No debris in the trachea or mainstem bronchi. Musculoskeletal: There are no acute or suspicious osseous abnormalities. No evidence of musculoskeletal infection. Generalized chest wall edema. CT ABDOMEN PELVIS FINDINGS Hepatobiliary: Hypodense liver without focal abnormality. Small amount of high-density contents in the gallbladder may be sludge. No frank pericholecystic inflammation. There is no biliary dilatation. Pancreas: No ductal dilatation or inflammation. Spleen: Enlarged spleen spanning 13.6 cm cranial caudal. Heterogeneous parenchyma with multiple splenic infarcts throughout the splenic parenchyma. An area of  low-density in the upper spleen measuring 8.3 cm may be a large infarct or splenic abscess. There is no internal air. This is not significantly changed in size from prior. Adrenals/Urinary Tract: Normal adrenal glands. No hydronephrosis or perinephric edema. Homogeneous renal enhancement. Absent renal excretion on delayed phase imaging suggest underlying renal dysfunction. Urinary bladder is decompressed by Foley catheter. Stomach/Bowel: The stomach is decompressed by enteric tube. Occasional areas of small bowel wall thickening involving the jejunal bowel loops in the left abdomen. No pneumatosis. Fluid distends the cecum and ascending colon. There is wall thickening of the transverse colon. Formed stool in the more distal colon. 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 Mild adjacent soft tissue thickening. No active extravasation. Normal caliber abdominal aorta. Limited assessment for adenopathy with multiple prominent retroperitoneal lymph nodes. Reproductive: Hyperdense lesion in the right adnexa spanning 4.5 cm, unchanged from prior exam. This may represent a hemorrhagic cyst. There is no evidence of active extravasation. Uterus and left ovary appear normal. Other: Moderate volume of free fluid in the abdomen and pelvis, with only minimal increase over the past 2 days. Fluid is mildly complex with decreasing in density, may represent resolving hemoperitoneum. Generalized mesenteric and body wall edema, with confluent soft tissue edema in the flanks. No  free air. No drainable fluid collection. Musculoskeletal: No CT findings of musculoskeletal infection. There are no acute or suspicious osseous abnormalities. IMPRESSION: CT chest: 1. Moderately large bilateral pleural effusions with adjacent compressive atelectasis. Patchy and heterogeneous bilateral lung opacities in the aerated lungs, some of which are nodular. Findings likely represent a combination  of infection (including septic emboli) and pulmonary edema. 2. Aortic valve calcifications and ill-defined low-density likely vegetations. CT abdomen/pelvis: 1. Moderate volume of free fluid in the abdomen and pelvis, with only minimal increase over the past 2 days. This fluid is mildly complex with decreasing density from prior exams, may represent resolving hemoperitoneum. Etiology of this free fluid is indeterminate, may be from spleen, right adnexal lesion, or potentially SMA aneurysm. There is no evidence of active extravasation. 2. Splenomegaly with multiple splenic infarcts. Large area of low-density within the upper spleen may represent a large infarct or abscess. No internal air. 3. Developing areas of small bowel wall thickening involving the left abdomen which may be infective or ischemic. There is no pneumatosis or obstruction. 4. Wall thickening of the transverse colon which may be infectious or ischemic. Fluid distends the cecum and ascending colon. 5. 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 No active extravasation. 6. Hyperdense lesion in the right adnexa measuring 4.5 cm, unchanged from prior exams. This may represent a hemorrhagic cyst. 7. Absent renal excretion on delayed phase imaging suggest underlying renal dysfunction. Electronically Signed   By: Narda Rutherford M.D.   On: 07/09/2020 16:34   CT CEREBRAL PERFUSION W CONTRAST  Addendum Date: 06/26/2020   ADDENDUM REPORT: 07/07/2020 00:10 ADDENDUM: The above numbers refer strictly to the computed data. Given the large amount of hypoattenuation demonstrated on the pre contrast head CT, the degree of core infarct is likely underestimated by the RAPID software. This is most evident at the supraganglionic level and in the anterior ganglionic level zones. However, it is not possible to accurately quantify the degree to which the calculated core infarct is underestimated. The calculated area of Tmax>6.0s appears to more  closely correspond to the hypodense region on the earlier noncontrast CT than does the area of CBF<30%. I suspect the greatest area of penumbra is in the posterior right MCA territory. These findings were discussed with Dr. Otelia Limes at 12:01 AM on 07/21/2020. Electronically Signed   By: Deatra Robinson M.D.   On: 07/19/2020 00:10   Result Date: 07/12/2020 CLINICAL DATA:  Right MCA stroke EXAM: CT PERFUSION BRAIN TECHNIQUE: Multiphase CT imaging of the brain was performed following IV bolus contrast injection. Subsequent parametric perfusion maps were calculated using RAPID software. CONTRAST:  50mL OMNIPAQUE IOHEXOL 350 MG/ML SOLN COMPARISON:  None. FINDINGS: CT Brain Perfusion Findings: CBF (<30%) Volume: 35mL Perfusion (Tmax>6.0s) volume: Mismatch Volume: ASPECTS on noncontrast CT Head: 0 at 9:38 p.m. today. Infarct Core: 35 mL Infarction Location:Right MCA territory IMPRESSION: 35 mL core infarct in the right MCA territory with 110 mL surrounding ischemic penumbra. Electronically Signed: By: Deatra Robinson M.D. On: July 11, 2020 23:32   DG Chest Port 1 View  Result Date: 07/10/2020 CLINICAL DATA:  Hypoxia EXAM: PORTABLE CHEST 1 VIEW COMPARISON:  July 08, 2020 chest radiograph; chest CT July 09, 2020 FINDINGS: Endotracheal tube tip is 3.1 cm above the carina. Nasogastric tube tip and side port below the diaphragm. Central catheter tip is in the superior vena cava near the cavoatrial junction. No pneumothorax. Extensive airspace opacity throughout the lungs bilaterally persists with suspected underlying  pleural effusions. Heart is mildly enlarged with pulmonary vascularity normal. No adenopathy. No bone lesions. IMPRESSION: Tube and catheter positions as described without pneumothorax. Widespread airspace opacity persists bilaterally with suspected underlying pleural effusions. Stable cardiac prominence. Electronically Signed   By: Bretta Bang III M.D.   On: 07/10/2020 08:00   DG CHEST  PORT 1 VIEW  Result Date: 07/15/2020 CLINICAL DATA:  Acute respiratory failure.  Intubation. EXAM: PORTABLE CHEST 1 VIEW COMPARISON:  One-view chest x-ray 07/20/2020 at 7 o'clock a.m. FINDINGS: Heart size is at right low lung volumes. Patient has been intubated. Endotracheal tube terminates 2 cm above the carina. Right IJ line is stable. Diffuse interstitial and airspace opacities are again noted bilaterally. Aeration is slightly improved from the prior study. IMPRESSION: 1. Interval intubation. Endotracheal tube terminates 2 cm above the carina. 2. Right IJ line is stable. 3. Low lung volumes and diffuse interstitial and airspace opacities bilaterally likely representing edema. Electronically Signed   By: Marin Roberts M.D.   On: 06/27/2020 15:40   DG CHEST PORT 1 VIEW  Result Date: 07/01/2020 CLINICAL DATA:  40 year old female central line placement. Right MCA ELVO, large infarct. EXAM: PORTABLE CHEST 1 VIEW COMPARISON:  Portable chest 07/15/2020. FINDINGS: Portable AP semi upright view at 0700 hours. Right IJ approach central line placed, tip at the lower SVC level likely just above the cavoatrial junction. No pneumothorax. Stable cardiac size and mediastinal contours. Bilateral ventilation not significantly changed from last night. Confluent perihilar opacity and indistinct pulmonary vasculature. Negative visible bowel gas, osseous structures. IMPRESSION: 1. Right IJ approach central line placed, tip at the lower SVC level. No adverse features. 2. Continued extensive perihilar opacity and indistinct vasculature favoring acute pulmonary edema. Electronically Signed   By: Odessa Fleming M.D.   On: 07/15/2020 07:13   DG Chest Portable 1 View  Result Date: 07/21/2020 CLINICAL DATA:  40 year old female with leukocytosis. EXAM: PORTABLE CHEST 1 VIEW COMPARISON:  Chest radiograph dated 03/25/2006. FINDINGS: There is cardiomegaly with diffuse interstitial and interlobular septal prominence and Kerley  B-lines most consistent with edema. Pneumonia is not excluded clinical correlation is recommended. There is a small left pleural effusion. No pneumothorax. No acute osseous pathology. IMPRESSION: Cardiomegaly with findings of CHF. Pneumonia is not excluded. Electronically Signed   By: Elgie Collard M.D.   On: 07/06/2020 23:39   DG Abd Portable 1V  Result Date: 07/09/2020 CLINICAL DATA:  OG tube placement EXAM: PORTABLE ABDOMEN - 1 VIEW COMPARISON:  CT 07/09/2020 FINDINGS: Esophageal tube tip overlies the mid gastric region. Faint excreted contrast at the kidneys with possible delayed nephrogram. Small right pleural effusion. Airspace disease at the medial left base. IMPRESSION: 1. Esophageal tube tip overlies the mid gastric region. 2. Small right pleural effusion. 3. Suspicion of delayed nephrogram, correlate with renal function test Electronically Signed   By: Jasmine Pang M.D.   On: 07/09/2020 18:33   VAS [place] ABI WITH/WO TBI  Result Date: 07/10/2020 LOWER EXTREMITY DOPPLER STUDY Indications: Mottled bilateral lower extremities, endocarditis with septic              emboli.  Comparison Study: No prior study Performing Technologist: Hill, Jody RVT, RDMS  Examination Guidelines: A complete evaluation includes at minimum, Doppler waveform signals and systolic blood pressure reading at the level of bilateral brachial, anterior tibial, and posterior tibial arteries, when vessel segments are accessible. Bilateral testing is considered an integral part of a complete examination. Photoelectric Plethysmograph (PPG) waveforms and toe systolic pressure readings  are included as required and additional duplex testing as needed. Limited examinations for reoccurring indications may be performed as noted.  ABI Findings: +---------+------------------+-----+-------------------+-----------------------+ Right    Rt Pressure (mmHg)IndexWaveform           Comment                  +---------+------------------+-----+-------------------+-----------------------+ Brachial                                           PICC line and bandaging +---------+------------------+-----+-------------------+-----------------------+ Popliteal                       triphasic                                  +---------+------------------+-----+-------------------+-----------------------+ PTA      27                0.28 dampened monophasic                        +---------+------------------+-----+-------------------+-----------------------+ DP                                                 Unable to insonate      +---------+------------------+-----+-------------------+-----------------------+ Great Toe                       Absent                                     +---------+------------------+-----+-------------------+-----------------------+ +---------+------------------+-----+-------------------+-------+ Left     Lt Pressure (mmHg)IndexWaveform           Comment +---------+------------------+-----+-------------------+-------+ Brachial 95                     triphasic                  +---------+------------------+-----+-------------------+-------+ Popliteal                       triphasic                  +---------+------------------+-----+-------------------+-------+ PTA      57                0.60 monophasic                 +---------+------------------+-----+-------------------+-------+ DP       46                0.48 dampened monophasic        +---------+------------------+-----+-------------------+-------+ Great Toe                       Absent                     +---------+------------------+-----+-------------------+-------+ +-------+-----------+-----------+------------+------------+ ABI/TBIToday's ABIToday's TBIPrevious ABIPrevious TBI +-------+-----------+-----------+------------+------------+ Right  0.28       0.00                                 +-------+-----------+-----------+------------+------------+  Left   0.60       0.00                                +-------+-----------+-----------+------------+------------+  Summary: Right: Resting right ankle-brachial index indicates critical limb ischemia. 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  *See table(s) above for measurements and observations.  Electronically signed by Sherald Hesshristopher Clark MD on 07/10/2020 at 4:57:23 PM.    Final    ECHOCARDIOGRAM COMPLETE  Result Date: 06/28/2020    ECHOCARDIOGRAM REPORT   Patient Name:   Terri Oneal Date of Exam: 07/12/2020 Medical Rec #:  [id number]       Height:       64.0 in Accession #:    [id number]      Weight:       119.7 lb Date of Birth:  01/27/1981        BSA:          1.573 m Patient Age:    39 years        BP:           113/54 mmHg Patient Gender: F               HR:           120 bpm. Exam Location:  Inpatient Procedure: 2D Echo, Cardiac Doppler and Color Doppler Indications:    Endocarditis I38  History:        Patient has no prior history of Echocardiogram examinations.                 Mitral Valve Prolapse.  Sonographer:    Eulah PontSarah Pirrotta RDCS Referring Phys: [id number] RAVI AGARWALA IMPRESSIONS  1. Left ventricular ejection fraction, by estimation, is 60 to 65%. The left ventricle has normal function. The left ventricle has no regional wall motion abnormalities. Left ventricular diastolic function could not be evaluated.  2. Right ventricular systolic function is normal. The right ventricular size is normal.  3. The mitral valve is normal in structure. Mild mitral valve regurgitation. No evidence of mitral stenosis.  4. Tricuspid valve regurgitation is mild to moderate.     5. There is at least one mobile shaggy density on the ventricular side of the AV. The valve is not visualized well enough to discern which cusp it emanates from but possibly involves more than 1  cusp. There is also concern for perivalvular abcess with flow into a small echolucent space in the area of the right coronary cusp. There is at least moderate and possibly severe aortic regurgiation. This is consistent with endocarditis. Recommend TEE.  5. The inferior vena cava is normal in size with greater than 50% respiratory variability, suggesting right atrial pressure of 3 mmHg. FINDINGS  Left Ventricle: Left ventricular ejection fraction, by estimation, is 60 to 65%. The left ventricle has normal function. The left ventricle has no regional wall motion abnormalities. The left ventricular internal cavity size was normal in size. There is  no left ventricular hypertrophy. Left ventricular diastolic function could not be evaluated. Right Ventricle: The right ventricular size is normal. No increase in right ventricular wall thickness. Right ventricular systolic function is normal. Left Atrium: Left atrial size was normal in size. Right Atrium: Right atrial size was normal in size. Pericardium: There is no evidence of pericardial effusion. Mitral Valve: The mitral valve is normal in structure. There is mild thickening  of the mitral valve leaflet(s). Mild mitral valve regurgitation. No evidence of mitral valve stenosis. Tricuspid Valve: The tricuspid valve is normal in structure. Tricuspid valve regurgitation is mild to moderate. No evidence of tricuspid stenosis. Aortic Valve: There is at least one mobile shaggy density on the ventricular side of the AV. The valve is not visualized well enough to discern which cusp it emanates from but possibly involves more than 1 cusp. There is also concern for perivalvular abcess with flow into a small echolucent space in the area of the right coronary cusp. There is at least moderate and possibly severe aortic regurgiation. This is consistent with endocarditis. Recommend TEE. The aortic valve is abnormal. Aortic valve regurgitation is moderate to severe. No aortic stenosis is  present. Pulmonic Valve: The pulmonic valve was normal in structure. Pulmonic valve regurgitation is not visualized. No evidence of pulmonic stenosis. Aorta: The aortic root is normal in size and structure. Venous: The inferior vena cava is normal in size with greater than 50% respiratory variability, suggesting right atrial pressure of 3 mmHg. IAS/Shunts: No atrial level shunt detected by color flow Doppler.  LEFT VENTRICLE PLAX 2D LVIDd:         4.60 cm LVIDs:         2.80 cm LV PW:         0.80 cm LV IVS:        0.80 cm LVOT diam:     2.00 cm LV SV:         91 LV SV Index:   58 LVOT Area:     3.14 cm  RIGHT VENTRICLE TAPSE (M-mode): 2.1 cm LEFT ATRIUM             Index       RIGHT ATRIUM           Index LA diam:        3.40 cm 2.16 cm/m  RA Area:     11.30 cm LA Vol (A2C):   34.1 ml 21.68 ml/m RA Volume:   24.50 ml  15.58 ml/m LA Vol (A4C):   90.5 ml 57.53 ml/m LA Biplane Vol: 58.5 ml 37.19 ml/m  AORTIC VALVE LVOT Vmax:   181.00 cm/s LVOT Vmean:  114.000 cm/s LVOT VTI:    0.291 m  AORTA Ao Root diam: 3.20 cm Ao Asc diam:  3.60 cm  SHUNTS Systemic VTI:  0.29 m Systemic Diam: 2.00 cm Armanda Magic MD Electronically signed by Armanda Magic MD Signature Date/Time: 06/24/2020/4:45:48 PM    Final    ECHO TEE  Result Date: 07/09/2020    TRANSESOPHOGEAL ECHO REPORT   Patient Name:   Terri Oneal Date of Exam: 07/09/2020 Medical Rec #:  [id number]       Height:       64.0 in Accession #:    [id number]      Weight:       119.7 lb Date of Birth:  May 17, 1981        BSA:          1.573 m Patient Age:    39 years        BP:           89/46 mmHg Patient Gender: F               HR:           104 bpm. Exam Location:  Inpatient Procedure: Transesophageal Echo, Color Doppler and Cardiac Doppler Indications:  Endocarditis I38; Bacteremia  History:         Patient has prior history of Echocardiogram examinations, most                  recent 07/01/2020. 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   Sonographer:     Thurman Coyer RDCS (AE) Referring Phys:  [id number] Deloris Ping NAHSER Diagnosing Phys: Kristeen Miss MD PROCEDURE: After discussion of the risks and benefits of a TEE, an informed consent was obtained from a family member. The transesophogeal probe was passed without difficulty through the esophogus of the patient. Sedation performed by different physician. The patient was monitored while under deep sedation. Anesthestetic sedation was provided intravenously by Anesthesiology: 70mg  of Propofol. The patient developed no complications during the procedure. IMPRESSIONS  1. Left ventricular ejection fraction, by estimation, is 50 to 55%. The left ventricle has low normal function.  2. Right ventricular systolic function is normal. The right ventricular size is normal.  3. No left atrial/left atrial appendage thrombus was detected.  4. Large pleural effusion in the left lateral region.  5. The mitral valve is grossly normal. Moderate mitral valve regurgitation.  6. Aortic valve vegetation is visualized on the right.  7. The aortic valve is bicuspid. There is a large vegetation on the right coronary cusp. There is severe Aortic insufficiency.. The aortic valve is bicuspid. Aortic valve regurgitation is severe. FINDINGS  Left Ventricle: Left ventricular ejection fraction, by estimation, is 50 to 55%. The left ventricle has low normal function. The left ventricular internal cavity size was normal in size. Right Ventricle: The right ventricular size is normal. No increase in right ventricular wall thickness. Right ventricular systolic function is normal. Left Atrium: Left atrial size was normal in size. No left atrial/left atrial appendage thrombus was detected. Right Atrium: Right atrial size was normal in size. Pericardium: There is no evidence of pericardial effusion. Mitral Valve: There is no reversal of flow in the pulmonary veins. The mitral valve is grossly normal. Moderate mitral valve regurgitation.  Tricuspid Valve: The tricuspid valve is normal in structure. Tricuspid valve regurgitation is trivial. Aortic Valve: The aortic valve is bicuspid. There is a large vegetation on the right coronary cusp. There is severe Aortic insufficiency. The aortic valve is bicuspid. Aortic valve regurgitation is severe. A vegetation is seen on the right. Pulmonic Valve: The pulmonic valve was normal in structure. Pulmonic valve regurgitation is not visualized. Aorta: The aortic root is normal in size and structure. IAS/Shunts: The atrial septum is grossly normal. Additional Comments: There is a large pleural effusion in the left lateral region. Kristeen Miss MD Electronically signed by Kristeen Miss MD Signature Date/Time: 07/09/2020/4:35:41 PM    Final    CT HEAD CODE STROKE WO CONTRAST  Result Date: 07-09-2020 CLINICAL DATA:  Code stroke.  Neuro deficit, acute, stroke suspected EXAM: CT HEAD WITHOUT CONTRAST TECHNIQUE: Contiguous axial images were obtained from the base of the skull through the vertex without intravenous contrast. COMPARISON:  05/16/2010. FINDINGS: Brain: No intracranial hemorrhage. Acute right MCA territory insult with blurring of the gray-white junctions and cerebral edema. Partial effacement of the right lateral ventricle. No mass lesion. No midline shift, ventriculomegaly or extra-axial fluid collection. Vascular: No hyperdense vessel or unexpected calcification. Skull: Negative for fracture or focal lesion. Sinuses/Orbits: No acute orbital finding. Layering sphenoid sinus secretions. Mild ethmoid sinus mucosal thickening. Pneumatized mastoid  air cells. Other: None. ASPECTS [facility] Stroke Program Early CT Score) - Ganglionic level infarction (caudate, lentiform nuclei, internal capsule, insula, M1-M3 cortex): 0 - Supraganglionic infarction (M4-M6 cortex): 0 Total score (0-10 with 10 being normal): 0 IMPRESSION: 1. Acute/subacute right MCA territory infarct. No intracranial hemorrhage. 2. ASPECTS is 0  Code stroke imaging results were communicated on 07/02/2020 at 9:45 pm to provider Dr. Otelia Limes via secure text paging. Electronically Signed   By: Stana Bunting M.D.   On: 07/24/2020 21:49    Microbiology Recent Results (from the past 240 hour(s))  Resp Panel by RT-PCR (Flu A&B, Covid) Nasopharyngeal Swab     Status: None   Collection Time: 06/30/2020  9:53 PM   Specimen: Nasopharyngeal Swab; Nasopharyngeal(NP) swabs in vial transport medium  Result Value Ref Range Status   SARS Coronavirus 2 by RT PCR NEGATIVE NEGATIVE Final    Comment: (NOTE) SARS-CoV-2 target nucleic acids are NOT DETECTED.  The SARS-CoV-2 RNA is generally detectable in upper respiratory specimens during the acute phase of infection. The lowest concentration of SARS-CoV-2 viral copies this assay can detect is 138 copies/mL. A negative result does not preclude SARS-Cov-2 infection and should not be used as the sole basis for treatment or other patient management decisions. A negative result may occur with  improper specimen collection/handling, submission of specimen other than nasopharyngeal swab, presence of viral mutation(s) within the areas targeted by this assay, and inadequate number of viral copies(<138 copies/mL). A negative result must be combined with clinical observations, patient history, and epidemiological information. The expected result is Negative.  Fact Sheet for Patients:  BloggerCourse.com  Fact Sheet for Healthcare Providers:  SeriousBroker.it  This test is no t yet approved or cleared by the [place] FDA and  has been authorized for detection and/or diagnosis of SARS-CoV-2 by FDA under an Emergency Use Authorization (EUA). This EUA will remain  in effect (meaning this test can be used) for the duration of the COVID-19 declaration under Section 564(b)(1) of the Act, 21 U.S.C.section 360bbb-3(b)(1), unless the authorization is  terminated  or revoked sooner.       Influenza A by PCR NEGATIVE NEGATIVE Final   Influenza B by PCR NEGATIVE NEGATIVE Final    Comment: (NOTE) The Xpert Xpress SARS-CoV-2/FLU/RSV plus assay is intended as an aid in the diagnosis of influenza from Nasopharyngeal swab specimens and should not be used as a sole basis for treatment. Nasal washings and aspirates are unacceptable for Xpert Xpress SARS-CoV-2/FLU/RSV testing.  Fact Sheet for Patients: BloggerCourse.com  Fact Sheet for Healthcare Providers: SeriousBroker.it  This test is not yet approved or cleared by the [place] FDA and has been authorized for detection and/or diagnosis of SARS-CoV-2 by FDA under an Emergency Use Authorization (EUA). This EUA will remain in effect (meaning this test can be used) for the duration of the COVID-19 declaration under Section 564(b)(1) of the Act, 21 U.S.C. section 360bbb-3(b)(1), unless the authorization is terminated or revoked.  Performed at [facility], [address], [place], [place] [address]   Culture, blood (single)     Status: Abnormal   Collection Time: 07/06/2020 11:40 PM   Specimen: BLOOD  Result Value Ref Range Status   Specimen Description BLOOD RIGHT ANTECUBITAL  Final   Special Requests   Final    BOTTLES DRAWN AEROBIC AND ANAEROBIC Blood Culture adequate volume   Culture  Setup Time   Final    GRAM POSITIVE COCCI IN CHAINS IN BOTH AEROBIC AND ANAEROBIC  BOTTLES Organism ID to follow CRITICAL RESULT CALLED TO, READ BACK BY AND VERIFIED WITH: Antoine Primas PharmD 14:00 2020/07/30 ([email or web address]) Performed at [facility], [address], [place], [place] [address]    Culture ENTEROCOCCUS FAECALIS (A)  Final   Report Status 07/10/2020 FINAL  Final   Organism ID, Bacteria ENTEROCOCCUS FAECALIS  Final      Susceptibility   Enterococcus faecalis - MIC*    AMPICILLIN <=2 SENSITIVE Sensitive     VANCOMYCIN 2  SENSITIVE Sensitive     GENTAMICIN SYNERGY SENSITIVE Sensitive     * ENTEROCOCCUS FAECALIS  Culture, blood (routine x 2)     Status: Abnormal   Collection Time: 07/21/2020 11:40 PM   Specimen: BLOOD  Result Value Ref Range Status   Specimen Description BLOOD RIGHT BICEP  Final   Special Requests   Final    BOTTLES DRAWN AEROBIC AND ANAEROBIC Blood Culture adequate volume   Culture  Setup Time   Final    GRAM POSITIVE COCCI IN CHAINS IN BOTH AEROBIC AND ANAEROBIC BOTTLES IDENTIFICATION TO FOLLOW CRITICAL RESULT CALLED TO, READ BACK BY AND VERIFIED WITH: Antoine Primas PharmD 14:00 July 30, 2020 ([email or web address])    Culture (A)  Final    ENTEROCOCCUS FAECALIS SUSCEPTIBILITIES PERFORMED ON PREVIOUS CULTURE WITHIN THE LAST 5 DAYS. Performed at [facility], [address], [place], [place] [address]    Report Status 07/10/2020 FINAL  Final  Blood Culture ID Panel (Reflexed)     Status: Abnormal   Collection Time: 07/03/2020 11:40 PM  Result Value Ref Range Status   Enterococcus faecalis DETECTED (A) NOT DETECTED Final    Comment: CRITICAL RESULT CALLED TO, READ BACK BY AND VERIFIED WITH: Antoine Primas PharmD 14:00 07-30-20 ([email or web address])    Enterococcus Faecium NOT DETECTED NOT DETECTED Final   Listeria monocytogenes NOT DETECTED NOT DETECTED Final   Staphylococcus species NOT DETECTED NOT DETECTED Final   Staphylococcus aureus (BCID) NOT DETECTED NOT DETECTED Final   Staphylococcus epidermidis NOT DETECTED NOT DETECTED Final   Staphylococcus lugdunensis NOT DETECTED NOT DETECTED Final   Streptococcus species NOT DETECTED NOT DETECTED Final   Streptococcus agalactiae NOT DETECTED NOT DETECTED Final   Streptococcus pneumoniae NOT DETECTED NOT DETECTED Final   Streptococcus pyogenes NOT DETECTED NOT DETECTED Final   A.calcoaceticus-baumannii NOT DETECTED NOT DETECTED Final   Bacteroides fragilis NOT DETECTED NOT DETECTED Final   Enterobacterales NOT DETECTED NOT DETECTED Final   Enterobacter cloacae  complex NOT DETECTED NOT DETECTED Final   Escherichia coli NOT DETECTED NOT DETECTED Final   Klebsiella aerogenes NOT DETECTED NOT DETECTED Final   Klebsiella oxytoca NOT DETECTED NOT DETECTED Final   Klebsiella pneumoniae NOT DETECTED NOT DETECTED Final   Proteus species NOT DETECTED NOT DETECTED Final   Salmonella species NOT DETECTED NOT DETECTED Final   Serratia marcescens NOT DETECTED NOT DETECTED Final   Haemophilus influenzae NOT DETECTED NOT DETECTED Final   Neisseria meningitidis NOT DETECTED NOT DETECTED Final   Pseudomonas aeruginosa NOT DETECTED NOT DETECTED Final   Stenotrophomonas maltophilia NOT DETECTED NOT DETECTED Final   Candida albicans NOT DETECTED NOT DETECTED Final   Candida auris NOT DETECTED NOT DETECTED Final   Candida glabrata NOT DETECTED NOT DETECTED Final   Candida krusei NOT DETECTED NOT DETECTED Final   Candida parapsilosis NOT DETECTED NOT DETECTED Final   Candida tropicalis NOT DETECTED NOT DETECTED Final   Cryptococcus neoformans/gattii NOT DETECTED NOT DETECTED Final   Vancomycin resistance NOT DETECTED NOT DETECTED Final  Comment: Performed at [facility], [address], [place], [place] [address]  Urine Culture     Status: Abnormal   Collection Time: 07/07/2020 12:36 AM   Specimen: Urine, Catheterized  Result Value Ref Range Status   Specimen Description URINE, CATHETERIZED  Final   Special Requests   Final    NONE Performed at [facility], [address] [place], [place] [address]    Culture >=100,000 COLONIES/mL ESCHERICHIA COLI (A)  Final   Report Status 07/10/2020 FINAL  Final   Organism ID, Bacteria ESCHERICHIA COLI (A)  Final      Susceptibility   Escherichia coli - MIC*    AMPICILLIN <=2 SENSITIVE Sensitive     CEFAZOLIN <=4 SENSITIVE Sensitive     CEFEPIME <=0.12 SENSITIVE Sensitive     CEFTRIAXONE <=0.25 SENSITIVE Sensitive     CIPROFLOXACIN <=0.25 SENSITIVE Sensitive     GENTAMICIN <=1 SENSITIVE Sensitive      IMIPENEM <=0.25 SENSITIVE Sensitive     NITROFURANTOIN <=16 SENSITIVE Sensitive     TRIMETH/SULFA <=20 SENSITIVE Sensitive     AMPICILLIN/SULBACTAM <=2 SENSITIVE Sensitive     PIP/TAZO <=4 SENSITIVE Sensitive     * >=100,000 COLONIES/mL ESCHERICHIA COLI  Surgical pcr screen     Status: Abnormal   Collection Time: July 13, 2020  2:24 PM   Specimen: Nasal Mucosa; Nasal Swab  Result Value Ref Range Status   MRSA, PCR NEGATIVE NEGATIVE Final   Staphylococcus aureus POSITIVE (A) NEGATIVE Final    Comment: (NOTE) The Xpert SA Assay (FDA approved for NASAL specimens in patients 67 years of age and older), is one component of a comprehensive surveillance program. It is not intended to diagnose infection nor to guide or monitor treatment. Performed at [facility], [address], [place], [place] [address]   Culture, blood (Routine X 2) w Reflex to ID Panel     Status: None   Collection Time: 07/09/20 11:58 AM   Specimen: BLOOD  Result Value Ref Range Status   Specimen Description BLOOD CENTRAL LINE  Final   Special Requests   Final    BOTTLES DRAWN AEROBIC AND ANAEROBIC Blood Culture adequate volume   Culture   Final    NO GROWTH 5 DAYS Performed at [facility], [address] [place], [place] [address]    Report Status 07/14/2020 FINAL  Final  Culture, blood (Routine X 2) w Reflex to ID Panel     Status: None   Collection Time: 07/09/20 11:58 AM   Specimen: BLOOD  Result Value Ref Range Status   Specimen Description BLOOD CENTRAL LINE  Final   Special Requests   Final    BOTTLES DRAWN AEROBIC AND ANAEROBIC Blood Culture adequate volume   Culture   Final    NO GROWTH 5 DAYS Performed at [facility], [address], [place], [place] [address]    Report Status 07/14/2020 FINAL  Final    Lab Basic Metabolic Panel: No results for input(s): NA, K, CL, CO2, GLUCOSE, BUN, CREATININE, CALCIUM, MG, PHOS in the last 168 hours. Liver Function Tests: No results for  input(s): AST, ALT, ALKPHOS, BILITOT, PROT, ALBUMIN in the last 168 hours. No results for input(s): LIPASE, AMYLASE in the last 168 hours. No results for input(s): AMMONIA in the last 168 hours. CBC: No results for input(s): WBC, NEUTROABS, HGB, HCT, MCV, PLT in the last 168 hours. Cardiac Enzymes: No results for input(s): CKTOTAL, CKMB, CKMBINDEX, TROPONINI  in the last 168 hours. Sepsis Labs: Recent Labs  Lab 07/11/20 1217  LATICACIDVEN 2.9*    Procedures/Operations  Decompressive hemicraniectomy 12/15 Endotracheal intubation 12/15 Transesophageal echocardiogram 12/16   Oyindamola Key A Kimimila Tauzin 07/17/2020, 7:17 PM

## 2020-07-25 DEATH — deceased

## 2020-07-29 ENCOUNTER — Encounter (HOSPITAL_COMMUNITY): Payer: Self-pay

## 2022-09-21 IMAGING — DX DG CHEST 1V PORT
1 series · 1 of 1 positions shown · non-contrast
Comparison: July 08, 2020 chest radiograph; chest CT July 09, 2020

CLINICAL DATA: Hypoxia

EXAM:
PORTABLE CHEST 1 VIEW

[chest]
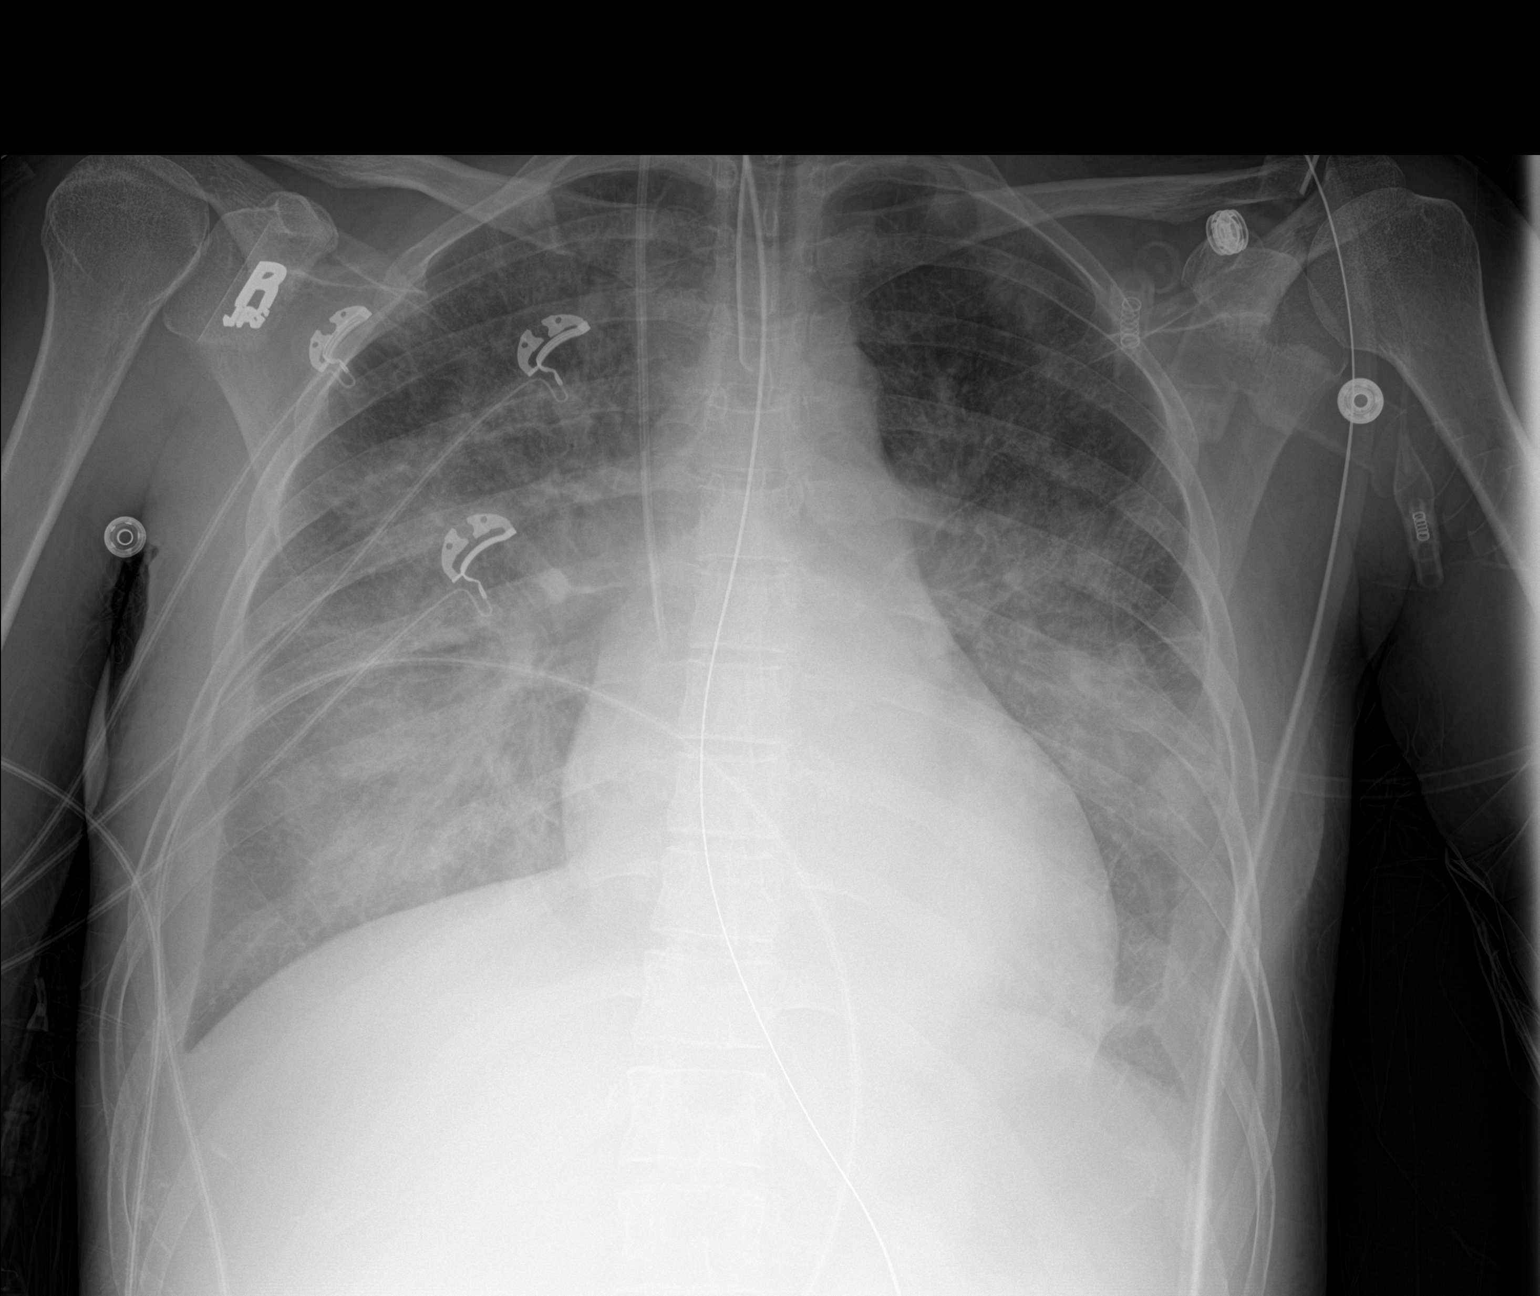

[1 of 1 positions shown; findings below may reference images not displayed]

FINDINGS: Endotracheal tube tip is 3.1 cm above the carina. Nasogastric tube
tip and side port below the diaphragm. Central catheter tip is in
the superior vena cava near the cavoatrial junction. No
pneumothorax. Extensive airspace opacity throughout the lungs
bilaterally persists with suspected underlying pleural effusions.
Heart is mildly enlarged with pulmonary vascularity normal. No
adenopathy. No bone lesions.
IMPRESSION: Tube and catheter positions as described without pneumothorax.
Widespread airspace opacity persists bilaterally with suspected
underlying pleural effusions. Stable cardiac prominence.
